# Patient Record
Sex: Male | Born: 1985 | Race: White | Hispanic: No | Marital: Single | Smoking: Current every day smoker
Health system: Southern US, Community
[De-identification: ages and names within clinical notes are randomized; demographics above are authoritative.]

## PROBLEM LIST (undated history)

## (undated) DIAGNOSIS — K219 Gastro-esophageal reflux disease without esophagitis: Secondary | ICD-10-CM

## (undated) DIAGNOSIS — E785 Hyperlipidemia, unspecified: Secondary | ICD-10-CM

## (undated) DIAGNOSIS — I82403 Acute embolism and thrombosis of unspecified deep veins of lower extremity, bilateral: Secondary | ICD-10-CM

## (undated) DIAGNOSIS — I10 Essential (primary) hypertension: Secondary | ICD-10-CM

## (undated) HISTORY — PX: WISDOM TOOTH EXTRACTION: SHX21

## (undated) HISTORY — PX: TONSILLECTOMY: SUR1361

## (undated) HISTORY — DX: Hyperlipidemia, unspecified: E78.5

## (undated) HISTORY — PX: APPENDECTOMY: SHX54

---
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
## 2007-05-23 ENCOUNTER — Emergency Department: Payer: Self-pay | Admitting: Emergency Medicine

## 2008-01-18 ENCOUNTER — Emergency Department: Payer: Self-pay | Admitting: Unknown Physician Specialty

## 2008-07-17 ENCOUNTER — Emergency Department: Payer: Self-pay

## 2009-08-28 ENCOUNTER — Emergency Department: Payer: Self-pay | Admitting: Emergency Medicine

## 2014-01-15 ENCOUNTER — Emergency Department: Payer: Self-pay | Admitting: Student

## 2014-10-13 ENCOUNTER — Encounter: Payer: Self-pay | Admitting: Emergency Medicine

## 2014-10-13 ENCOUNTER — Observation Stay
Admission: EM | Admit: 2014-10-13 | Discharge: 2014-10-15 | Disposition: A | Discharge status: Home / Self Care | Payer: Self-pay | Attending: Surgery | Admitting: Surgery

## 2014-10-13 ENCOUNTER — Emergency Department: Payer: Self-pay

## 2014-10-13 DIAGNOSIS — Z6838 Body mass index (BMI) 38.0-38.9, adult: Secondary | ICD-10-CM | POA: Insufficient documentation

## 2014-10-13 DIAGNOSIS — K3580 Unspecified acute appendicitis: Principal | ICD-10-CM | POA: Insufficient documentation

## 2014-10-13 DIAGNOSIS — Z9889 Other specified postprocedural states: Secondary | ICD-10-CM | POA: Insufficient documentation

## 2014-10-13 DIAGNOSIS — Z8249 Family history of ischemic heart disease and other diseases of the circulatory system: Secondary | ICD-10-CM | POA: Insufficient documentation

## 2014-10-13 DIAGNOSIS — R1031 Right lower quadrant pain: Secondary | ICD-10-CM | POA: Insufficient documentation

## 2014-10-13 DIAGNOSIS — E669 Obesity, unspecified: Secondary | ICD-10-CM | POA: Insufficient documentation

## 2014-10-13 DIAGNOSIS — F17200 Nicotine dependence, unspecified, uncomplicated: Secondary | ICD-10-CM | POA: Insufficient documentation

## 2014-10-13 LAB — URINALYSIS COMPLETE WITH MICROSCOPIC (ARMC ONLY)
Bacteria, UA: NONE SEEN
Bilirubin Urine: NEGATIVE
Glucose, UA: NEGATIVE mg/dL
HGB URINE DIPSTICK: NEGATIVE
KETONES UR: NEGATIVE mg/dL
LEUKOCYTES UA: NEGATIVE
NITRITE: NEGATIVE
PROTEIN: NEGATIVE mg/dL
RBC / HPF: NONE SEEN RBC/hpf (ref 0–5)
Specific Gravity, Urine: 1.034 — ABNORMAL HIGH (ref 1.005–1.030)
Squamous Epithelial / LPF: NONE SEEN
pH: 6 (ref 5.0–8.0)

## 2014-10-13 LAB — COMPREHENSIVE METABOLIC PANEL
ALK PHOS: 52 U/L (ref 38–126)
ALT: 45 U/L (ref 17–63)
AST: 24 U/L (ref 15–41)
Albumin: 4.3 g/dL (ref 3.5–5.0)
Anion gap: 7 (ref 5–15)
BUN: 7 mg/dL (ref 6–20)
CHLORIDE: 102 mmol/L (ref 101–111)
CO2: 27 mmol/L (ref 22–32)
CREATININE: 0.74 mg/dL (ref 0.61–1.24)
Calcium: 8.9 mg/dL (ref 8.9–10.3)
GFR calc non Af Amer: >60 mL/min (ref >60)
Glucose, Bld: 78 mg/dL (ref 65–99)
Potassium: 4.1 mmol/L (ref 3.5–5.1)
Sodium: 136 mmol/L (ref 135–145)
Total Bilirubin: 0.4 mg/dL (ref 0.3–1.2)
Total Protein: 7.4 g/dL (ref 6.5–8.1)

## 2014-10-13 LAB — CBC
HCT: 48.2 % (ref 40.0–52.0)
HEMOGLOBIN: 16.1 g/dL (ref 13.0–18.0)
MCH: 29.8 pg (ref 26.0–34.0)
MCHC: 33.5 g/dL (ref 32.0–36.0)
MCV: 89 fL (ref 80.0–100.0)
Platelets: 266 10*3/uL (ref 150–440)
RBC: 5.41 MIL/uL (ref 4.40–5.90)
RDW: 13.5 % (ref 11.5–14.5)
WBC: 13.3 10*3/uL — ABNORMAL HIGH (ref 3.8–10.6)

## 2014-10-13 MED ORDER — ONDANSETRON HCL 4 MG/2ML IJ SOLN
INTRAMUSCULAR | Status: AC
  Administered 2014-10-13: 4 mg via INTRAVENOUS
  Filled 2014-10-13: qty 2

## 2014-10-13 MED ORDER — KETOROLAC TROMETHAMINE 30 MG/ML IJ SOLN
30.0000 mg | Freq: Three times a day (TID) | INTRAMUSCULAR | Status: DC
  Administered 2014-10-13 – 2014-10-14 (×2): 30 mg via INTRAVENOUS
  Filled 2014-10-13 (×3): qty 1

## 2014-10-13 MED ORDER — MORPHINE SULFATE 2 MG/ML IJ SOLN
1.0000 mg | INTRAMUSCULAR | Status: DC | PRN
  Filled 2014-10-13: qty 1

## 2014-10-13 MED ORDER — ACETAMINOPHEN 500 MG PO TABS
1000.0000 mg | ORAL_TABLET | Freq: Once | ORAL | Status: AC
  Administered 2014-10-13: 1000 mg via ORAL

## 2014-10-13 MED ORDER — ACETAMINOPHEN 500 MG PO TABS
ORAL_TABLET | ORAL | Status: AC
  Administered 2014-10-13: 1000 mg via ORAL
  Filled 2014-10-13: qty 2

## 2014-10-13 MED ORDER — IOHEXOL 300 MG/ML  SOLN
125.0000 mL | Freq: Once | INTRAMUSCULAR | Status: AC | PRN
  Administered 2014-10-13: 125 mL via INTRAVENOUS
  Filled 2014-10-13: qty 125

## 2014-10-13 MED ORDER — ONDANSETRON HCL 4 MG/2ML IJ SOLN
4.0000 mg | Freq: Once | INTRAMUSCULAR | Status: AC
  Administered 2014-10-13: 4 mg via INTRAVENOUS

## 2014-10-13 MED ORDER — DEXTROSE 5 % IV SOLN
1.0000 g | Freq: Four times a day (QID) | INTRAVENOUS | Status: DC
  Administered 2014-10-13 – 2014-10-14 (×4): 1 g via INTRAVENOUS
  Filled 2014-10-13 (×7): qty 1

## 2014-10-13 MED ORDER — ONDANSETRON HCL 4 MG/2ML IJ SOLN
4.0000 mg | Freq: Four times a day (QID) | INTRAMUSCULAR | Status: DC | PRN
  Administered 2014-10-14: 4 mg via INTRAVENOUS

## 2014-10-13 MED ORDER — HYDROMORPHONE HCL 1 MG/ML IJ SOLN
1.0000 mg | Freq: Once | INTRAMUSCULAR | Status: AC
  Administered 2014-10-13: 1 mg via INTRAVENOUS
  Filled 2014-10-13: qty 1

## 2014-10-13 MED ORDER — SODIUM CHLORIDE 0.9 % IV SOLN
Freq: Once | INTRAVENOUS | Status: AC
  Administered 2014-10-13: 15:00:00 via INTRAVENOUS

## 2014-10-13 MED ORDER — ONDANSETRON HCL 4 MG/2ML IJ SOLN
4.0000 mg | Freq: Once | INTRAMUSCULAR | Status: AC
  Administered 2014-10-13: 4 mg via INTRAVENOUS
  Filled 2014-10-13: qty 2

## 2014-10-13 MED ORDER — KCL IN DEXTROSE-NACL 20-5-0.45 MEQ/L-%-% IV SOLN
INTRAVENOUS | Status: DC
  Administered 2014-10-13 – 2014-10-14 (×3): via INTRAVENOUS
  Filled 2014-10-13 (×6): qty 1000

## 2014-10-13 MED ORDER — IOHEXOL 240 MG/ML SOLN
25.0000 mL | Freq: Once | INTRAMUSCULAR | Status: AC | PRN
  Filled 2014-10-13: qty 25

## 2014-10-13 NOTE — ED Notes (Signed)
Pt presents with RUQ pain started last night. Denies any n/v/d. No acute distress noted.

## 2014-10-13 NOTE — ED Notes (Signed)
Pt to CT

## 2014-10-13 NOTE — ED Provider Notes (Signed)
[facility] Emergency Department Provider Note     Time seen: ----------------------------------------- 2:02 PM on 10/13/2014 -----------------------------------------    I have reviewed the triage vital signs and the nursing notes.   HISTORY  Chief Complaint Abdominal Pain    HPI Thomas Hopkins is a 29 y.o. male who presents ER with right lower quadrant pain that started last night. Patient denies any fevers chills, chest pain, shortness of breath, nausea vomiting or diarrhea. Pain is only in the right side. Movement sometimes helps. He describes pain as sharp and 8 out of 10 at this point.   History reviewed. No pertinent past medical history.  There are no active problems to display for this patient.   Past Surgical History  Procedure Laterality Date  . Tonsillectomy      Allergies Review of patient's allergies indicates no known allergies.  Social History History  Substance Use Topics  . Smoking status: Current Some Day Smoker  . Smokeless tobacco: Not on file  . Alcohol Use: No    Review of Systems Constitutional: Negative for fever. Eyes: Negative for visual changes. ENT: Negative for sore throat. Cardiovascular: Negative for chest pain. Respiratory: Negative for shortness of breath. Gastrointestinal: Positive for abdominal pain Genitourinary: Negative for dysuria. Musculoskeletal: Negative for back pain. Skin: Negative for rash. Neurological: Negative for headaches, focal weakness or numbness.  10-point ROS otherwise negative.  ____________________________________________   PHYSICAL EXAM:  VITAL SIGNS: ED Triage Vitals  Enc Vitals Group     BP 10/13/14 1322 116/68 mmHg     Pulse --      Resp 10/13/14 1322 18     Temp 10/13/14 1322 98.4 F (36.9 C)     Temp Source 10/13/14 1322 Oral     SpO2 10/13/14 1322 96 %     Weight 10/13/14 1322 250 lb (113.399 kg)     Height 10/13/14 1322 5\' 9"  (1.753 m)     Head Cir  --      Peak Flow --      Pain Score 10/13/14 1322 8     Pain Loc --      Pain Edu? --      Excl. in GC? --     Constitutional: Alert and oriented. Well appearing and in no distress. Eyes: Conjunctivae are normal. PERRL. Normal extraocular movements. ENT   Head: Normocephalic and atraumatic.   Nose: No congestion/rhinnorhea.   Mouth/Throat: Mucous membranes are moist.   Neck: No stridor. Cardiovascular: Normal rate, regular rhythm. Normal and symmetric distal pulses are present in all extremities. No murmurs, rubs, or gallops. Respiratory: Normal respiratory effort without tachypnea nor retractions. Breath sounds are clear and equal bilaterally. No wheezes/rales/rhonchi. Gastrointestinal: Pain in McBurney's point, no other specific focal tenderness. No rebound or guarding. Normal bowel sounds. Musculoskeletal: Nontender with normal range of motion in all extremities. No joint effusions.  No lower extremity tenderness nor edema. Neurologic:  Normal speech and language. No gross focal neurologic deficits are appreciated. Speech is normal. No gait instability. Skin:  Skin is warm, dry and intact. No rash noted. Psychiatric: Mood and affect are normal. Speech and behavior are normal. Patient exhibits appropriate insight and judgment. ____________________________________________  ED COURSE:  Pertinent labs & imaging results that were available during my care of the patient were reviewed by me and considered in my medical decision making (see chart for details). Clinically patient has appendicitis, will need CT imaging. IV Dilaudid and Zofran will be given for pain. ____________________________________________  LABS (pertinent positives/negatives)  Labs Reviewed  CBC - Abnormal; Notable for the following:    WBC 13.3 (*)    All other components within normal limits  URINALYSIS COMPLETEWITH MICROSCOPIC ([facility] ONLY) - Abnormal; Notable for the following:    Color, Urine  YELLOW (*)    APPearance CLEAR (*)    Specific Gravity, Urine 1.034 (*)    All other components within normal limits  COMPREHENSIVE METABOLIC PANEL    RADIOLOGY Images were viewed by me  CT abdomen and pelvis with contrast IMPRESSION: 1. No acute inflammatory process within abdomen. 2. Normal appendix. No pericecal inflammation. 3. Mild thickening of urinary bladder wall. Clinical correlation is necessary to exclude mild cystitis. 4. No small bowel obstruction. 5. No hydronephrosis or hydroureter.  ____________________________________________  FINAL ASSESSMENT AND PLAN  Acute right lower quadrant pain   Plan: Patient with labs and imaging as dictated above. Dr. Colette Ribas been contacted due to the persistent nature and specific nature of his right lower quadrant pain. Urine looks unremarkable. Disposition is pending surgery evaluation.   Emily Filbert, MD   Emily Filbert, MD 10/13/14 [phone]

## 2014-10-13 NOTE — H&P (Signed)
Thomas Hopkins is an 29 y.o. male.     Chief Complaint: RLQ abd pain  HPI: 29 year-old otherwise healthy white male presents to the emergency room with right lower quadrant abdominal pain which began around 1:00 in the morning. He got up use the bathroom did not have nausea vomiting or diarrhea. He denies any fever. There has been no sick contacts. He went back to sleep and woke up with continued right lower quadrant abdominal pain which has stayed in originating in the right lower quadrant came to the emergency room to seek medical attention. He denies any dysuria flank pain yellow jaundice history of diverticulitis or any other abdominal operations in the past. He denies any similar type abdominal pain like this in the past.  Evaluation in the emergency room with CT scan demonstrates a normal-appearing appendix which is not dilated.   History reviewed. No pertinent past medical history.  Past Surgical History  Procedure Laterality Date  . Tonsillectomy     Family history significant for hypertension.  Social History:  reports that he has been smoking.  He does not have any smokeless tobacco history on file. He reports that he does not drink alcohol. His drug history is not on file.  Allergies: No Known Allergies   Review of Systems  Constitutional: Negative for fever, chills, weight loss, malaise/fatigue and diaphoresis.  HENT: Negative.   Eyes: Negative.   Respiratory: Negative.   Cardiovascular: Negative.   Gastrointestinal: Positive for heartburn and nausea. Negative for vomiting, diarrhea, constipation, blood in stool and melena.  Genitourinary: Negative for dysuria, urgency, frequency, hematuria and flank pain.  Skin: Negative for itching and rash.  Neurological: Negative.  Negative for weakness.  Endo/Heme/Allergies: Negative.     Physical Exam:  Physical Exam  Constitutional: He is oriented to person, place, and time and well-developed, well-nourished, and in no  distress. No distress.  HENT:  Head: Atraumatic.  Eyes: Conjunctivae are normal. Pupils are equal, round, and reactive to light.  Neck: Neck supple.  Cardiovascular: Normal rate.   Pulmonary/Chest: Breath sounds normal. No respiratory distress.  Abdominal: Soft. Bowel sounds are normal. He exhibits no distension and no mass. There is no hepatosplenomegaly. There is tenderness in the right lower quadrant. There is rebound and tenderness at McBurney's point. There is no rigidity, no guarding and no CVA tenderness. No hernia.    Neurological: He is oriented to person, place, and time.  Skin: Skin is warm and dry. He is not diaphoretic.  Psychiatric: Mood, memory, affect and judgment normal.    Blood pressure 102/63, pulse 66, temperature 98.4 F (36.9 C), temperature source Oral, resp. rate 16, height 5' 9" (1.753 m), weight 250 lb (113.399 kg), SpO2 94 %.  Results for orders placed or performed during the hospital encounter of 10/13/14 (from the past 48 hour(s))  Comprehensive metabolic panel     Status: None   Collection Time: 10/13/14  1:44 PM  Result Value Ref Range   Sodium 136 135 - 145 mmol/L   Potassium 4.1 3.5 - 5.1 mmol/L   Chloride 102 101 - 111 mmol/L   CO2 27 22 - 32 mmol/L   Glucose, Bld 78 65 - 99 mg/dL   BUN 7 6 - 20 mg/dL   Creatinine, Ser 0.74 0.61 - 1.24 mg/dL   Calcium 8.9 8.9 - 10.3 mg/dL   Total Protein 7.4 6.5 - 8.1 g/dL   Albumin 4.3 3.5 - 5.0 g/dL   AST 24 15 - 41 U/L  ALT 45 17 - 63 U/L   Alkaline Phosphatase 52 38 - 126 U/L   Total Bilirubin 0.4 0.3 - 1.2 mg/dL   GFR calc non Af Amer >60 >60 mL/min   GFR calc Af Amer >60 >60 mL/min    Comment: (NOTE) The eGFR has been calculated using the CKD EPI equation. This calculation has not been validated in all clinical situations. eGFR's persistently <60 mL/min signify possible Chronic Kidney Disease.    Anion gap 7 5 - 15  CBC     Status: Abnormal   Collection Time: 10/13/14  1:44 PM  Result Value Ref  Range   WBC 13.3 (H) 3.8 - 10.6 K/uL   RBC 5.41 4.40 - 5.90 MIL/uL   Hemoglobin 16.1 13.0 - 18.0 g/dL   HCT 48.2 40.0 - 52.0 %   MCV 89.0 80.0 - 100.0 fL   MCH 29.8 26.0 - 34.0 pg   MCHC 33.5 32.0 - 36.0 g/dL   RDW 13.5 11.5 - 14.5 %   Platelets 266 150 - 440 K/uL  Urinalysis complete, with microscopic ([facility] only)     Status: Abnormal   Collection Time: 10/13/14  3:40 PM  Result Value Ref Range   Color, Urine YELLOW (A) YELLOW   APPearance CLEAR (A) CLEAR   Glucose, UA NEGATIVE NEGATIVE mg/dL   Bilirubin Urine NEGATIVE NEGATIVE   Ketones, ur NEGATIVE NEGATIVE mg/dL   Specific Gravity, Urine 1.034 (H) 1.005 - 1.030   Hgb urine dipstick NEGATIVE NEGATIVE   pH 6.0 5.0 - 8.0   Protein, ur NEGATIVE NEGATIVE mg/dL   Nitrite NEGATIVE NEGATIVE   Leukocytes, UA NEGATIVE NEGATIVE   RBC / HPF NONE SEEN 0 - 5 RBC/hpf   WBC, UA 0-5 0 - 5 WBC/hpf   Bacteria, UA NONE SEEN NONE SEEN   Squamous Epithelial / LPF NONE SEEN NONE SEEN   Mucous PRESENT    Ct Abdomen Pelvis W Contrast  10/13/2014   CLINICAL DATA:  Right lower quadrant pain starting yesterday evening  EXAM: CT ABDOMEN AND PELVIS WITH CONTRAST  TECHNIQUE: Multidetector CT imaging of the abdomen and pelvis was performed using the standard protocol following bolus administration of intravenous contrast.  CONTRAST:  125mL OMNIPAQUE IOHEXOL 300 MG/ML  SOLN  COMPARISON:  02/17/2011  FINDINGS: Lung bases are unremarkable. Sagittal images of the spine are unremarkable.  Liver, pancreas, spleen and adrenal glands are unremarkable. Abdominal aorta is unremarkable.  Kidneys are symmetrical in size and enhancement. No hydronephrosis or hydroureter.  No small bowel obstruction. No ascites or free air. No adenopathy. Normal appendix is noted in axial image 65. No pericecal inflammation. Terminal ileum is unremarkable. No small bowel or colonic obstruction. There is nonspecific mild thickening of urinary bladder wall. Mild cystitis cannot be excluded.  Prostate gland and seminal vesicles are unremarkable.  IMPRESSION: 1. No acute inflammatory process within abdomen. 2. Normal appendix.  No pericecal inflammation. 3. Mild thickening of urinary bladder wall. Clinical correlation is necessary to exclude mild cystitis. 4. No small bowel obstruction. 5. No hydronephrosis or hydroureter.   Electronically Signed   By: Liviu  Pop M.D.   On: 10/13/2014 15:25    I personally reviewed all the CT scan images on the PACS monitor.   Assessment/Plan    27-year-old otherwise healthy obese male with acute onset of right lower quadrant abdominal pain and some leukocytosis but normal-appearing CT scan obtained 14 hours after initial symptoms started. His exam is concerning for appendicitis. The patient will be admitted   to the hospital and hydrated and placed on intravenous antibiotics.  We will reassess later tonight. If he is improved we'll treat him for early appendicitis with nonoperative therapy and antibiotics. If he does not improve he will require diagnostic laparoscopy and appendectomy. I'll have Dr. Burt Knack my associate check on him later tonight. Patient is in agreement with this plan and wishes to be admitted to the hospital for such.  Hortencia Conradi, MD, FACS

## 2014-10-13 NOTE — Progress Notes (Signed)
Patient seen at the request of Dr. Egbert Garibaldi for follow-up. History is reviewed with Dr. Egbert Garibaldi and with the chart and with the patient. Patient states that his pain is better but it waxes and wanes. He confirms that it started yesterday approximately 24 hours ago. Denies nausea or vomiting at this time and points to the right side and periumbilical area.  Patient is obese abdomen is soft nondistended minimally tender in the right lower quadrant without guarding or rebound and a negative Rovsing sign. White blood cell count is elevated.  Probable early appendicitis CT scan is been reviewed. I agree with Dr. Egbert Garibaldi that this is likely early appendicitis and should declare itself in the next 24 hours he will likely require laparoscopic appendectomy this will be confirmed and discussed with Dr. Egbert Garibaldi in the morning.

## 2014-10-13 NOTE — ED Notes (Signed)
Pt throwing up at this time. MD notified.

## 2014-10-14 ENCOUNTER — Encounter: Payer: Self-pay | Admitting: Anesthesiology

## 2014-10-14 ENCOUNTER — Observation Stay: Payer: MEDICAID | Admitting: Anesthesiology

## 2014-10-14 ENCOUNTER — Observation Stay: Payer: Self-pay | Admitting: Anesthesiology

## 2014-10-14 ENCOUNTER — Encounter
Admission: EM | Disposition: A | Discharge status: Home / Self Care | Payer: Self-pay | Source: Home / Self Care | Attending: Emergency Medicine

## 2014-10-14 DIAGNOSIS — R1031 Right lower quadrant pain: Secondary | ICD-10-CM | POA: Insufficient documentation

## 2014-10-14 HISTORY — PX: LAPAROSCOPIC APPENDECTOMY: SHX408

## 2014-10-14 LAB — CBC WITH DIFFERENTIAL/PLATELET
BASOS ABS: 0.1 10*3/uL (ref 0–0.1)
Basophils Relative: 1 %
EOS ABS: 0.2 10*3/uL (ref 0–0.7)
Eosinophils Relative: 2 %
HCT: 45.1 % (ref 40.0–52.0)
HEMOGLOBIN: 15.2 g/dL (ref 13.0–18.0)
Lymphocytes Relative: 33 %
Lymphs Abs: 3.3 10*3/uL (ref 1.0–3.6)
MCH: 30 pg (ref 26.0–34.0)
MCHC: 33.6 g/dL (ref 32.0–36.0)
MCV: 89.1 fL (ref 80.0–100.0)
MONO ABS: 0.7 10*3/uL (ref 0.2–1.0)
Monocytes Relative: 7 %
NEUTROS ABS: 5.7 10*3/uL (ref 1.4–6.5)
Neutrophils Relative %: 57 %
PLATELETS: 244 10*3/uL (ref 150–440)
RBC: 5.06 MIL/uL (ref 4.40–5.90)
RDW: 13.8 % (ref 11.5–14.5)
WBC: 9.9 10*3/uL (ref 3.8–10.6)

## 2014-10-14 LAB — BASIC METABOLIC PANEL
Anion gap: 8 (ref 5–15)
BUN: 9 mg/dL (ref 6–20)
CO2: 28 mmol/L (ref 22–32)
CREATININE: 0.83 mg/dL (ref 0.61–1.24)
Calcium: 8.5 mg/dL — ABNORMAL LOW (ref 8.9–10.3)
Chloride: 103 mmol/L (ref 101–111)
GFR calc Af Amer: >60 mL/min (ref >60)
GFR calc non Af Amer: >60 mL/min (ref >60)
Glucose, Bld: 97 mg/dL (ref 65–99)
POTASSIUM: 4 mmol/L (ref 3.5–5.1)
Sodium: 139 mmol/L (ref 135–145)

## 2014-10-14 LAB — MRSA PCR SCREENING: MRSA BY PCR: NEGATIVE

## 2014-10-14 SURGERY — APPENDECTOMY, LAPAROSCOPIC
Anesthesia: General

## 2014-10-14 MED ORDER — NEOSTIGMINE METHYLSULFATE 10 MG/10ML IV SOLN
INTRAVENOUS | Status: DC | PRN
  Administered 2014-10-14: 3 mg via INTRAVENOUS

## 2014-10-14 MED ORDER — PROPOFOL 10 MG/ML IV BOLUS
INTRAVENOUS | Status: DC | PRN
  Administered 2014-10-14: 180 mg via INTRAVENOUS

## 2014-10-14 MED ORDER — SUCCINYLCHOLINE CHLORIDE 20 MG/ML IJ SOLN
INTRAMUSCULAR | Status: DC | PRN
  Administered 2014-10-14: 100 mg via INTRAVENOUS

## 2014-10-14 MED ORDER — MIDAZOLAM HCL 2 MG/2ML IJ SOLN
INTRAMUSCULAR | Status: DC | PRN
  Administered 2014-10-14: 1 mg via INTRAVENOUS

## 2014-10-14 MED ORDER — FENTANYL CITRATE (PF) 100 MCG/2ML IJ SOLN
25.0000 ug | INTRAMUSCULAR | Status: DC | PRN
  Administered 2014-10-14 (×3): 50 ug via INTRAVENOUS

## 2014-10-14 MED ORDER — HYDROMORPHONE HCL 1 MG/ML IJ SOLN
INTRAMUSCULAR | Status: AC
  Administered 2014-10-14: 0.5 mg via INTRAVENOUS
  Filled 2014-10-14: qty 1

## 2014-10-14 MED ORDER — BUPIVACAINE-EPINEPHRINE (PF) 0.25% -1:200000 IJ SOLN
INTRAMUSCULAR | Status: AC
  Filled 2014-10-14: qty 30

## 2014-10-14 MED ORDER — LIDOCAINE HCL (CARDIAC) 20 MG/ML IV SOLN
INTRAVENOUS | Status: DC | PRN
  Administered 2014-10-14: 30 mg via INTRAVENOUS

## 2014-10-14 MED ORDER — ONDANSETRON HCL 4 MG/2ML IJ SOLN: 4.0000 mg | Freq: Once | INTRAMUSCULAR | Status: DC | PRN

## 2014-10-14 MED ORDER — HYDROMORPHONE HCL 1 MG/ML IJ SOLN
0.5000 mg | INTRAMUSCULAR | Status: DC | PRN
  Administered 2014-10-14 (×3): 0.5 mg via INTRAVENOUS

## 2014-10-14 MED ORDER — BUPIVACAINE HCL 0.25 % IJ SOLN
INTRAMUSCULAR | Status: DC | PRN
  Administered 2014-10-14: 30 mL

## 2014-10-14 MED ORDER — LACTATED RINGERS IV SOLN
INTRAVENOUS | Status: DC | PRN
  Administered 2014-10-14: 20:00:00 via INTRAVENOUS

## 2014-10-14 MED ORDER — HYDROMORPHONE HCL 1 MG/ML IJ SOLN
INTRAMUSCULAR | Status: AC
  Filled 2014-10-14: qty 1

## 2014-10-14 MED ORDER — SODIUM CHLORIDE 0.9 % IR SOLN
Status: DC | PRN
  Administered 2014-10-14: 300 mL

## 2014-10-14 MED ORDER — HYDROCODONE-ACETAMINOPHEN 5-300 MG PO TABS: 1.0000 | ORAL_TABLET | ORAL | Status: DC | PRN

## 2014-10-14 MED ORDER — ROCURONIUM BROMIDE 100 MG/10ML IV SOLN
INTRAVENOUS | Status: DC | PRN
  Administered 2014-10-14: 30 mg via INTRAVENOUS

## 2014-10-14 MED ORDER — DEXAMETHASONE SODIUM PHOSPHATE 4 MG/ML IJ SOLN
INTRAMUSCULAR | Status: DC | PRN
  Administered 2014-10-14: 4 mg via INTRAVENOUS

## 2014-10-14 MED ORDER — GLYCOPYRROLATE 0.2 MG/ML IJ SOLN
INTRAMUSCULAR | Status: DC | PRN
  Administered 2014-10-14: 0.2 mg via INTRAVENOUS
  Administered 2014-10-14: 0.6 mg via INTRAVENOUS

## 2014-10-14 MED ORDER — HYDROCODONE-ACETAMINOPHEN 5-325 MG PO TABS
1.0000 | ORAL_TABLET | ORAL | Status: DC | PRN
  Administered 2014-10-15 (×3): 1 via ORAL
  Filled 2014-10-14 (×3): qty 1

## 2014-10-14 MED ORDER — LACTATED RINGERS IV SOLN
INTRAVENOUS | Status: DC
  Administered 2014-10-14 – 2014-10-15 (×2): via INTRAVENOUS

## 2014-10-14 MED ORDER — ACETAMINOPHEN 325 MG PO TABS
650.0000 mg | ORAL_TABLET | Freq: Four times a day (QID) | ORAL | Status: DC | PRN
  Administered 2014-10-14 – 2014-10-15 (×2): 650 mg via ORAL
  Filled 2014-10-14 (×2): qty 2

## 2014-10-14 SURGICAL SUPPLY — 42 items
CANISTER SUCT 1200ML W/VALVE (MISCELLANEOUS) IMPLANT
CANISTER SUCT 3000ML (MISCELLANEOUS) ×3 IMPLANT
CHLORAPREP W/TINT 26ML (MISCELLANEOUS) ×3 IMPLANT
CLOSURE WOUND 1/2 X4 (GAUZE/BANDAGES/DRESSINGS) ×1
CUTTER LINEAR ENDO 35 ART FLEX (STAPLE) ×3 IMPLANT
CUTTER LINEAR ENDO 35 ETS (STAPLE) IMPLANT
DEFOGGER SCOPE WARMER CLEARIFY (MISCELLANEOUS) IMPLANT
DRAPE SHEET LG 3/4 BI-LAMINATE (DRAPES) ×3 IMPLANT
DRAPE UTILITY 15X26 TOWEL STRL (DRAPES) IMPLANT
DRESSING TELFA 4X3 1S ST N-ADH (GAUZE/BANDAGES/DRESSINGS) IMPLANT
DRSG TEGADERM 2-3/8X2-3/4 SM (GAUZE/BANDAGES/DRESSINGS) IMPLANT
ENDOPOUCH RETRIEVER 10 (MISCELLANEOUS) ×3 IMPLANT
GAUZE SPONGE 4X4 12PLY STRL (GAUZE/BANDAGES/DRESSINGS) IMPLANT
GLOVE BIO SURGEON STRL SZ7.5 (GLOVE) ×9 IMPLANT
GOWN STRL REUS W/ TWL LRG LVL3 (GOWN DISPOSABLE) ×1 IMPLANT
GOWN STRL REUS W/ TWL XL LVL3 (GOWN DISPOSABLE) ×1 IMPLANT
GOWN STRL REUS W/TWL LRG LVL3 (GOWN DISPOSABLE) ×2
GOWN STRL REUS W/TWL XL LVL3 (GOWN DISPOSABLE) ×2
IRRIGATION STRYKERFLOW (MISCELLANEOUS) ×1 IMPLANT
IRRIGATOR STRYKERFLOW (MISCELLANEOUS) ×3
IV NS 1000ML (IV SOLUTION)
IV NS 1000ML BAXH (IV SOLUTION) IMPLANT
LABEL OR SOLS (LABEL) IMPLANT
NDL SAFETY 25GX1.5 (NEEDLE) ×3 IMPLANT
NS IRRIG 500ML POUR BTL (IV SOLUTION) ×3 IMPLANT
PACK LAP CHOLECYSTECTOMY (MISCELLANEOUS) ×3 IMPLANT
PAD GROUND ADULT SPLIT (MISCELLANEOUS) IMPLANT
RELOAD CUTTER ETS 35MM STAND (ENDOMECHANICALS) ×9 IMPLANT
SCISSORS METZENBAUM CVD 33 (INSTRUMENTS) ×3 IMPLANT
SHEARS HARMONIC ACE PLUS 36CM (ENDOMECHANICALS) IMPLANT
SLEEVE ENDOPATH XCEL 5M (ENDOMECHANICALS) ×3 IMPLANT
STRAP SAFETY BODY (MISCELLANEOUS) ×3 IMPLANT
STRIP CLOSURE SKIN 1/2X4 (GAUZE/BANDAGES/DRESSINGS) ×2 IMPLANT
SUT VIC AB 0 UR5 27 (SUTURE) ×6 IMPLANT
SUT VIC AB 4-0 RB1 27 (SUTURE) ×2
SUT VIC AB 4-0 RB1 27X BRD (SUTURE) ×1 IMPLANT
SWABSTK COMLB BENZOIN TINCTURE (MISCELLANEOUS) IMPLANT
TAPE TRANSPORE STRL 2 31045 (GAUZE/BANDAGES/DRESSINGS) ×3 IMPLANT
TROCAR XCEL 12X100 BLDLESS (ENDOMECHANICALS) ×3 IMPLANT
TROCAR XCEL BLUNT TIP 100MML (ENDOMECHANICALS) ×3 IMPLANT
TROCAR XCEL NON-BLD 5MMX100MML (ENDOMECHANICALS) ×3 IMPLANT
TUBING INSUFFLATOR HI FLOW (MISCELLANEOUS) ×3 IMPLANT

## 2014-10-14 NOTE — Progress Notes (Signed)
Patient ID: ONEY FOLZ, male   DOB: 15-May-1985, 29 y.o.   MRN: [id number]  [facility]   PATIENT NAME: Kaileb Monsanto    MR#:  [id number]  DATE OF BIRTH:  [birth year]  SUBJECTIVE:  He is feeling better. He does not want morphine. He denies any nausea and vomiting. Still having some right lower quadrant abdominal pain.  REVIEW OF SYSTEMS:   Review of Systems  Constitutional: Negative for fever and chills.  Gastrointestinal: Positive for abdominal pain. Negative for nausea and vomiting.  Genitourinary: Negative for dysuria, urgency and hematuria.  All other systems reviewed and are negative.   DRUG ALLERGIES:  No Known Allergies  VITALS:  Blood pressure 100/53, pulse 45, temperature 98.1 F (36.7 C), temperature source Oral, resp. rate 16, height  (1.753 m), weight 264 lb 1.6 oz (119.795 kg), SpO2 97 %.  PHYSICAL EXAMINATION:  GENERAL:  29 y.o.-year-old patient lying in the bed with no acute distress.  EYES: Pupils equal, round, reactive to light and accommodation. No scleral icterus. Extraocular muscles intact.  HEENT: Head atraumatic, normocephalic. Oropharynx and nasopharynx clear.  NECK:  Supple, no jugular venous distention. No thyroid enlargement, no tenderness.  LUNGS: Normal breath sounds bilaterally, no wheezing, rales,rhonchi or crepitation. No use of accessory muscles of respiration.  CARDIOVASCULAR: S1, S2 normal. No murmurs, rubs, or gallops.  ABDOMEN: Soft, nontender, nondistended. Bowel sounds present. No organomegaly or mass.  EXTREMITIES: No pedal edema, cyanosis, or clubbing.  NEUROLOGIC: Cranial nerves II through XII are intact. Muscle strength 5/5 in all extremities. Sensation intact. Gait not checked.  PSYCHIATRIC: The patient is alert and oriented x 3.  SKIN: No obvious rash, lesion, or ulcer.    CBC Latest Ref Rng 10/14/2014 10/13/2014  WBC 3.8 - 10.6 K/uL 9.9 13.3(H)  Hemoglobin 13.0 - 18.0 g/dL 78.2 95.6  Hematocrit 21.3 - 52.0 %  45.1 48.2  Platelets 150 - 440 K/uL 244 266     ASSESSMENT AND PLAN:   29 year old male with likely atypical appendicitis. He is somewhat improved. His white count is now normalized. I will reexamine him later today. If he is continuing to have pain on afternoon rounds he will require laparoscopic appendectomy and diagnostic laparoscopy he is in agreement.

## 2014-10-14 NOTE — Discharge Summary (Signed)
Physician Discharge Summary  Patient ID: Thomas Hopkins MRN: [id number] DOB/AGE: March 10, 1986 28 y.o.  Admit date: 10/13/2014 Discharge date: 10/14/2014   Discharge Diagnoses:  Active Problems:   Abdominal pain, acute, right lower quadrant   Procedures: Laparoscopic appendectomy  Hospital Course: She was admitted the hospital and observed with a CT-negative abdomen but with signs of acute appendicitis his pain progressed as did his exam and it was decided to perform a diagnostic laparoscopy which confirmed the presence of early appendicitis upper scopic appendectomy was performed. He made a non-, located postoperative recovery and is tolerating regular diet discharge in stable condition with instructions to shower and follow up in 10 days  Consults: None  Disposition: Final discharge disposition not confirmed     Medication List    TAKE these medications        Hydrocodone-Acetaminophen 5-300 MG Tabs  Commonly known as:  VICODIN  Take 1 tablet by mouth every 4 (four) hours as needed.           Follow-up Information    Follow up with Dionne Milo, MD In 10 days.   Specialty:  Surgery   Why:  For wound re-check   Contact information:   [address] [place] [address] [phone]       Lattie Haw, MD, FACS

## 2014-10-14 NOTE — Anesthesia Preprocedure Evaluation (Signed)
Anesthesia Evaluation  Patient identified by MRN, date of birth, ID band Patient awake    Reviewed: Allergy & Precautions, NPO status , Patient's Chart, lab work & pertinent test results, reviewed documented beta blocker date and time   Airway Mallampati: III  TM Distance: >3 FB     Dental  (+) Chipped   Pulmonary Current Smoker,          Cardiovascular     Neuro/Psych    GI/Hepatic   Endo/Other    Renal/GU      Musculoskeletal   Abdominal   Peds  Hematology   Anesthesia Other Findings Obesity   Reproductive/Obstetrics                             Anesthesia Physical Anesthesia Plan  ASA: III  Anesthesia Plan: General   Post-op Pain Management:    Induction: Intravenous  Airway Management Planned: Oral ETT  Additional Equipment:   Intra-op Plan:   Post-operative Plan:   Informed Consent: I have reviewed the patients History and Physical, chart, labs and discussed the procedure including the risks, benefits and alternatives for the proposed anesthesia with the patient or authorized representative who has indicated his/her understanding and acceptance.     Plan Discussed with: CRNA  Anesthesia Plan Comments:         Anesthesia Quick Evaluation

## 2014-10-14 NOTE — Op Note (Signed)
laparascopic appendectomy   Thomas Hopkins Date of operation:  10/14/2014  Indications: The patient presented with a history of  abdominal pain. Workup has revealed findings consistent with acute appendicitis.  Pre-operative Diagnosis: Acute appendicitis  Post-operative Diagnosis acute appendicitis nonruptured  Surgeon: Adah Salvage. Excell Seltzer, MD, FACS  Anesthesia: General with endotracheal tube  Procedure Details  The patient was seen again in the preop area. The options of surgery versus observation were reviewed with the patient and/or family. The risks of bleeding, infection, recurrence of symptoms, negative laparoscopy, potential for an open procedure, bowel injury, abscess or infection, were all reviewed as well. The patient was taken to Operating Room, identified as Thomas Hopkins and the procedure verified as laparoscopic appendectomy. A Time Out was held and the above information confirmed.  The patient was placed in the supine position and general anesthesia was induced.  Antibiotic prophylaxis was administered and VT E prophylaxis was in place. A Foley catheter was placed by the nursing staff.   The abdomen was prepped and draped in a sterile fashion. An infraumbilical incision was made. A Veress needle was placed and pneumoperitoneum was obtained. A 5 mm trocar port was placed without difficulty and the abdominal cavity was explored.  Under direct vision a 5 mm suprapubic port was placed and a 13 mm left lateral port was placed all under direct vision.  The appendix was identified and found to be acutely inflamed but not ruptured.  The appendix was carefully dissected. The base of the appendix was dissected out and divided with a standard load Endo GIA. The mesoappendix was divided with a vascular load Endo GIA. 3 firings were required. Inspection of the staple line demonstrated arterial bleeding therefore the staple line was reinforced with clips. The appendix was passed out  through the left lateral port site with the aid of an Endo Catch bag. The right lower quadrant and pelvis was then irrigated with copious amounts of normal saline which was aspirated. Inspection  failed to identify any additional bleeding and there were no signs of bowel injury. Therefore the left lateral port site was closed under direct vision utilizing an Endo Close technique with 0 Vicryl interrupted sutures, all under direct vision.   Again the right lower quadrant was inspected there was no sign of bleeding or bowel injury therefore pneumoperitoneum was released, all ports were removed and the skin incisions were approximated with subcuticular 4-0 Monocryl. Steri-Strips and Mastisol and sterile dressings were placed.  The patient tolerated the procedure well, there were no complications. The sponge lap and needle count were correct at the end of the procedure.  The patient was taken to the recovery room in stable condition to be admitted for continued care.  Findings: Acute appendicitis nonruptured  Estimated Blood Loss: 25 cc                  Specimens: appendix         Complications: None                  Rowena Moilanen E. Excell Seltzer MD, FACS

## 2014-10-14 NOTE — OR Nursing (Signed)
Patient's OR Fentanyl was documented under pacu phase of care

## 2014-10-14 NOTE — Progress Notes (Signed)
Patient ID: Thomas Hopkins, male   DOB: 05-06-1985, 29 y.o.   MRN: [id number]   He is unchanged from this a.m.'s examination. He is hungry. No nausea and vomiting. He has not required any intravenous narcotics for oral narcotics.  Exam is unchanged.  Plan for diagnostic laparoscopy later today.

## 2014-10-14 NOTE — Progress Notes (Signed)
Patient met last night and history reviewed again. Patient continues to have right lower quadrant pain which waxes and wanes but never goes away. His white blood cell count is back towards normal.  His exam today demonstrates continued right lower quadrant tenderness with some mild peritoneal irritation.  Viewing this patient's history and discussion with Dr. Marina Gravel I am in full agreement the laparoscopy is indicated and likely appendectomy will be performed. I discussed with the patient the rationale for this the options of observation the risks of bleeding infection recurrence of symptoms negative laparoscopy conversion to an open procedure. This is all reviewed for him in the preop area he understood and agreed to proceed

## 2014-10-14 NOTE — Anesthesia Procedure Notes (Signed)
Procedure Name: Intubation Date/Time: 10/14/2014 8:05 PM Performed by: Waldo Laine Pre-anesthesia Checklist: Patient identified, Emergency Drugs available, Suction available, Patient being monitored and Timeout performed Patient Re-evaluated:Patient Re-evaluated prior to inductionOxygen Delivery Method: Circle system utilized Preoxygenation: Pre-oxygenation with 100% oxygen Intubation Type: IV induction and Cricoid Pressure applied Ventilation: Mask ventilation without difficulty Tube type: Oral Laser Tube: Cuffed inflated with minimal occlusive pressure - saline Tube size: 7.5 mm Number of attempts: 1 Airway Equipment and Method: Stylet and Video-laryngoscopy Placement Confirmation: positive ETCO2 and breath sounds checked- equal and bilateral Secured at: 21 cm Tube secured with: Tape Difficulty Due To: Difficulty was anticipated Future Recommendations: Recommend- induction with short-acting agent, and alternative techniques readily available

## 2014-10-14 NOTE — Progress Notes (Signed)
Dr. Excell Seltzer notified of hypotension and bradycardia; no new orders written; Windy Carina, RN; 10/14/2014; 12:30 AM

## 2014-10-14 NOTE — Transfer of Care (Signed)
Immediate Anesthesia Transfer of Care Note  Patient: Thomas Hopkins  Procedure(s) Performed: Procedure(s): APPENDECTOMY LAPAROSCOPIC (N/A)  Patient Location: PACU  Anesthesia Type:General  Level of Consciousness: sedated, pateint uncooperative and confused  Airway & Oxygen Therapy: Patient Spontanous Breathing and Patient connected to face mask oxygen  Post-op Assessment: Report given to RN and Post -op Vital signs reviewed and stable  Post vital signs: Reviewed and stable  Last Vitals:  Filed Vitals:   10/14/14 1554  BP: 116/62  Pulse: 52  Temp: 36.9 C  Resp: 16    Complications: No apparent anesthesia complications

## 2014-10-14 NOTE — Progress Notes (Signed)
Patient ID: DUVAN MOUSEL, male   DOB: 1985-09-29, 29 y.o.   MRN: [id number]   Exam and pain unchanged Plan laparoscopic appendectomy today.

## 2014-10-15 ENCOUNTER — Encounter: Payer: Self-pay | Admitting: Surgery

## 2014-10-15 MED ORDER — KETOROLAC TROMETHAMINE 30 MG/ML IJ SOLN
30.0000 mg | Freq: Three times a day (TID) | INTRAMUSCULAR | Status: DC
  Administered 2014-10-15 (×2): 30 mg via INTRAVENOUS
  Filled 2014-10-15 (×2): qty 1

## 2014-10-15 MED ORDER — HYDROCODONE-ACETAMINOPHEN 5-325 MG PO TABS: 1.0000 | ORAL_TABLET | ORAL | Status: DC | PRN

## 2014-10-15 NOTE — Anesthesia Postprocedure Evaluation (Signed)
  Anesthesia Post-op Note  Patient: Thomas Hopkins  Procedure(s) Performed: Procedure(s): APPENDECTOMY LAPAROSCOPIC (N/A)  Anesthesia type:General  Patient location: PACU  Post pain: Pain level controlled  Post assessment: Post-op Vital signs reviewed, Patient's Cardiovascular Status Stable, Respiratory Function Stable, Patent Airway and No signs of Nausea or vomiting  Post vital signs: Reviewed and stable  Last Vitals:  Filed Vitals:   10/15/14 1227  BP: 106/50  Pulse: 50  Temp: 37 C  Resp: 16    Level of consciousness: awake, alert  and patient cooperative  Complications: No apparent anesthesia complications

## 2014-10-15 NOTE — Progress Notes (Signed)
Pt d/c home; d/c instructions reviewed w/ pt; pt understanding was verbalized; IV removed catheter in tact, gauze dressing applied; all pt questions answered; dressings CDI at time of d/c; pt left unit via wheelchair accompanied by staff

## 2014-10-15 NOTE — Progress Notes (Signed)
Patient ID: Thomas Hopkins, male   DOB: 08-13-1985, 29 y.o.   MRN: [id number]   Postoperative day #1 status post laparoscopic appendectomy.  He has Complaints of left lower quadrant abdominal pain as well as right shoulder plain and diffuse abdominal distention. He is only tolerated a liquid diet today. He has required some oral narcotics.  Filed Vitals:   10/14/14 2209 10/15/14 0003 10/15/14 0436 10/15/14 0755  BP: 107/51 99/52 96/58  94/48  Pulse: 50 97 92 54  Temp:  98.2 F (36.8 C) 97.6 F (36.4 C) 98.1 F (36.7 C)  TempSrc:  Oral Oral Oral  Resp:  16  17  Height:      Weight:      SpO2: 97% 94% 96% 97%   I/O last 3 completed shifts: In: 3296.5 [P.O.:360; I.V.:2836.5; IV Piggyback:100] Out: 2250 [Urine:2250]    Abdomen is soft. Dressings are dry.  Impression postoperative pain likely secondary to pneumoperitoneum.  Plan toward all. Mobilize. Possible discharge later this afternoon.

## 2014-10-15 NOTE — Discharge Instructions (Signed)
Remove dressing in 24 hours. °May shower in 24 hours. °Leave paper strips in place. °Resume all home medications. °Follow-up with Dr. Cooper in 10 days. °

## 2014-10-16 LAB — SURGICAL PATHOLOGY

## 2014-10-20 ENCOUNTER — Telehealth: Payer: Self-pay

## 2014-10-20 NOTE — Telephone Encounter (Signed)
Patient called back at this time.   Appointment changed to Dr. Excell Seltzer (10/22/14) at 2:30pm.  Readback of appointment information completed over the phone. Pt given directions to [facility] office.

## 2014-10-20 NOTE — Telephone Encounter (Signed)
Called patient at this time to place on Dr. Earmon Phoenix schedule as he is the one that did surgery.  No answer. Unable to leave message as voicemail was not available.  Will try once again later.

## 2014-10-22 ENCOUNTER — Other Ambulatory Visit: Payer: Self-pay

## 2014-10-22 ENCOUNTER — Observation Stay
Admission: AD | Admit: 2014-10-22 | Discharge: 2014-10-23 | Disposition: A | Discharge status: Home / Self Care | Payer: MEDICAID | Source: Ambulatory Visit | Attending: Surgery | Admitting: Surgery

## 2014-10-22 ENCOUNTER — Other Ambulatory Visit: Payer: Self-pay | Admitting: Internal Medicine

## 2014-10-22 ENCOUNTER — Ambulatory Visit (INDEPENDENT_AMBULATORY_CARE_PROVIDER_SITE_OTHER): Payer: Self-pay | Admitting: Surgery

## 2014-10-22 ENCOUNTER — Encounter: Payer: Self-pay | Admitting: Surgery

## 2014-10-22 VITALS — BP 123/76 | HR 109 | Temp 98.9°F | Ht 69.0 in | Wt 256.0 lb

## 2014-10-22 DIAGNOSIS — F17200 Nicotine dependence, unspecified, uncomplicated: Secondary | ICD-10-CM | POA: Insufficient documentation

## 2014-10-22 DIAGNOSIS — M79661 Pain in right lower leg: Secondary | ICD-10-CM

## 2014-10-22 DIAGNOSIS — M79669 Pain in unspecified lower leg: Secondary | ICD-10-CM

## 2014-10-22 DIAGNOSIS — M79662 Pain in left lower leg: Secondary | ICD-10-CM

## 2014-10-22 DIAGNOSIS — Z86718 Personal history of other venous thrombosis and embolism: Secondary | ICD-10-CM | POA: Diagnosis present

## 2014-10-22 DIAGNOSIS — I824Z9 Acute embolism and thrombosis of unspecified deep veins of unspecified distal lower extremity: Secondary | ICD-10-CM

## 2014-10-22 DIAGNOSIS — I82432 Acute embolism and thrombosis of left popliteal vein: Principal | ICD-10-CM | POA: Insufficient documentation

## 2014-10-22 DIAGNOSIS — I82443 Acute embolism and thrombosis of tibial vein, bilateral: Secondary | ICD-10-CM | POA: Insufficient documentation

## 2014-10-22 DIAGNOSIS — I82409 Acute embolism and thrombosis of unspecified deep veins of unspecified lower extremity: Secondary | ICD-10-CM | POA: Diagnosis present

## 2014-10-22 DIAGNOSIS — Z9049 Acquired absence of other specified parts of digestive tract: Secondary | ICD-10-CM | POA: Insufficient documentation

## 2014-10-22 MED ORDER — HYDROCODONE-ACETAMINOPHEN 5-325 MG PO TABS: 1.0000 | ORAL_TABLET | ORAL | Status: DC | PRN

## 2014-10-22 MED ORDER — APIXABAN 5 MG PO TABS
10.0000 mg | ORAL_TABLET | Freq: Two times a day (BID) | ORAL | Status: DC
  Administered 2014-10-22 – 2014-10-23 (×2): 10 mg via ORAL
  Filled 2014-10-22 (×2): qty 2

## 2014-10-22 MED ORDER — IBUPROFEN 600 MG PO TABS
600.0000 mg | ORAL_TABLET | Freq: Three times a day (TID) | ORAL | Status: DC
  Administered 2014-10-22 – 2014-10-23 (×2): 600 mg via ORAL
  Filled 2014-10-22 (×2): qty 1

## 2014-10-22 MED ORDER — ALPRAZOLAM 1 MG PO TABS: 1.0000 mg | ORAL_TABLET | Freq: Every evening | ORAL | Status: DC | PRN

## 2014-10-22 MED ORDER — ACETAMINOPHEN 325 MG PO TABS: 650.0000 mg | ORAL_TABLET | Freq: Four times a day (QID) | ORAL | Status: DC | PRN

## 2014-10-22 MED ORDER — DIPHENHYDRAMINE HCL 25 MG PO CAPS
50.0000 mg | ORAL_CAPSULE | Freq: Three times a day (TID) | ORAL | Status: DC | PRN
  Administered 2014-10-22: 50 mg via ORAL
  Filled 2014-10-22: qty 2

## 2014-10-22 MED ORDER — KCL IN DEXTROSE-NACL 20-5-0.45 MEQ/L-%-% IV SOLN
INTRAVENOUS | Status: DC
  Administered 2014-10-22: 23:00:00 via INTRAVENOUS
  Filled 2014-10-22 (×4): qty 1000

## 2014-10-22 MED ORDER — APIXABAN 5 MG PO TABS
5.0000 mg | ORAL_TABLET | Freq: Two times a day (BID) | ORAL | Status: DC
Start: 2014-10-29 — End: 2014-10-23

## 2014-10-22 MED ORDER — APIXABAN 5 MG PO TABS: 5.0000 mg | ORAL_TABLET | Freq: Two times a day (BID) | ORAL | Status: DC

## 2014-10-22 MED ORDER — FAMOTIDINE 20 MG PO TABS
20.0000 mg | ORAL_TABLET | Freq: Every day | ORAL | Status: DC
  Administered 2014-10-22 – 2014-10-23 (×2): 20 mg via ORAL
  Filled 2014-10-22 (×2): qty 1

## 2014-10-22 NOTE — Patient Instructions (Signed)
Radiology is scheduled for this afternoon for a venous ultrasound to assess your leg for blood clots. The results of this will dictate your disposition either home or to the emergency room for anticoagulation.  Concerning her abdomen and appendicitis he may return to your normal activities with the exception of lifting with a 25 pound weight limit restriction for another 2 weeks

## 2014-10-22 NOTE — H&P (Signed)
Thomas Hopkins is a 29 y.o. male who is one-week status post laparoscopic appendectomy.  HPI: He presented emergency room today with evidence of significant calf tenderness. His left leg hurts significantly and the cavity had some moderate pain on the right leg. Because of his symptoms he was referred to the hospital for urgent duplex ultrasound lower extremities which demonstrated popliteal and infrapopliteal thrombosis. He is medical hospital for anticoagulation and assisted with his medications.  His main symptoms are pain and discomfort when he is ambulating. He has had some mild swelling.  No past medical history on file. Past Surgical History  Procedure Laterality Date  . Tonsillectomy    . Laparoscopic appendectomy N/A 10/14/2014    Procedure: APPENDECTOMY LAPAROSCOPIC;  Surgeon: Lattie Haw, MD;  Location: [facility] ORS;  Service: General;  Laterality: N/A;   Social History   Social History  . Marital Status: Single    Spouse Name: N/A  . Number of Children: N/A  . Years of Education: N/A   Social History Main Topics  . Smoking status: Current Some Day Smoker -- 1.00 packs/day for 15 years  . Smokeless tobacco: Never Used  . Alcohol Use: Yes     Comment: rare consumption  . Drug Use: No  . Sexual Activity: Not on file   Other Topics Concern  . Not on file   Social History Narrative     ROS 10 point review of systems carried out and otherwise unremarkable.   PHYSICAL EXAM: BP 115/65 mmHg  Pulse 81  Temp(Src) 98.5 F (36.9 C) (Oral)  Resp 17  SpO2 97%  Physical Exam  Constitutional: He is oriented to person, place, and time. He appears well-developed and well-nourished.  HENT:  Head: Normocephalic and atraumatic.  Eyes: EOM are normal. Pupils are equal, round, and reactive to light.  Neck: Normal range of motion. Neck supple.  Cardiovascular: Regular rhythm and normal heart sounds.   Pulmonary/Chest: Effort normal and breath sounds normal.  Abdominal:  Soft.  Musculoskeletal: Normal range of motion. He exhibits edema and tenderness.  Neurological: He is alert and oriented to person, place, and time.  Skin: Skin is warm and dry.  Psychiatric: He has a normal mood and affect. His behavior is normal.   His legs demonstrate some mild edema and some tenderness on deep palpation. He does have a positive Homans sign left leg.  Impression/Plan: I have discussed situation with the medical doctors. We will start him on anticoagulation this evening. Because of his limited resources will have case management see him in the morning to assist with that indication as an outpatient. His family is in agreement with this plan.   Tiney Rouge III, MD  10/22/2014, 6:43 PM

## 2014-10-22 NOTE — Progress Notes (Signed)
Outpatient postop visit  10/22/2014  Thomas Hopkins is an 29 y.o. male.    Procedure: Lap or scopic appendectomy  CC: Left lower quadrant pain and left leg pain  HPI: Patient status post laparoscopic appendectomy and has been feeling well but approximately 8 days ago he fell asleep in his chair with his leg bent and since then has had pain in his left calf minimal pain in his right calf it hurts more when he stands. Denies fevers or chills no nausea or vomiting is tolerating a regular diet. It hurts to walk.  Of note he has no family history of blood clots.  Medications reviewed.    Physical Exam:  BP 123/76 mmHg  Pulse 109  Temp(Src) 98.9 F (37.2 C)  Ht  (1.753 m)  Wt 256 lb (116.121 kg)  BMI 37.79 kg/m2    PE: Abdomen is soft and nontender wounds are healing without erythema moderate ecchymosis in the left lateral port site.  Tenderness right calf with a negative Homans sign  Considerable tenderness of left calf with a questionably positive Homans sign. There is edema as well of the foot      Assessment/Plan:  Patient doing well postoperatively from laparoscopic appendectomy however there is concern for a left sided DVT. he is not in a high risk group but because of his symptoms and physical findings I am recommending an urgent venous Doppler scan. I discussed with he and his family the rationale for this and if it is positive he will be sent to the emergency room today to consider anticoagulation. A Wise he'll follow-up with [place] on an as-needed basis.  Lattie Haw, MD, FACS

## 2014-10-23 ENCOUNTER — Ambulatory Visit: Payer: Self-pay | Admitting: Surgery

## 2014-10-23 LAB — BASIC METABOLIC PANEL
ANION GAP: 10 (ref 5–15)
BUN: 12 mg/dL (ref 6–20)
CALCIUM: 8.9 mg/dL (ref 8.9–10.3)
CO2: 25 mmol/L (ref 22–32)
Chloride: 103 mmol/L (ref 101–111)
Creatinine, Ser: 0.76 mg/dL (ref 0.61–1.24)
GFR calc non Af Amer: >60 mL/min (ref >60)
Glucose, Bld: 88 mg/dL (ref 65–99)
Potassium: 4.1 mmol/L (ref 3.5–5.1)
Sodium: 138 mmol/L (ref 135–145)

## 2014-10-23 LAB — PROTIME-INR
INR: 1.23
Prothrombin Time: 15.7 seconds — ABNORMAL HIGH (ref 11.4–15.0)

## 2014-10-23 LAB — CBC
HCT: 44.3 % (ref 40.0–52.0)
HEMOGLOBIN: 14.8 g/dL (ref 13.0–18.0)
MCH: 30.3 pg (ref 26.0–34.0)
MCHC: 33.3 g/dL (ref 32.0–36.0)
MCV: 90.9 fL (ref 80.0–100.0)
Platelets: 224 10*3/uL (ref 150–440)
RBC: 4.87 MIL/uL (ref 4.40–5.90)
RDW: 13.6 % (ref 11.5–14.5)
WBC: 14.8 10*3/uL — ABNORMAL HIGH (ref 3.8–10.6)

## 2014-10-23 MED ORDER — APIXABAN 5 MG PO TABS: ORAL_TABLET | ORAL | Status: DC

## 2014-10-23 MED ORDER — ALPRAZOLAM 1 MG PO TABS: 1.0000 mg | ORAL_TABLET | Freq: Every evening | ORAL | Status: DC | PRN

## 2014-10-23 NOTE — Care Management (Addendum)
Discussed care with Dr Michela Pitcher. Spoke with patient for self pay resources. Patient lives with grandmother. Stated has no income and relies on Grandmother for support. Gave and explained applications for [facility] pt expressed understanding of application process. Patient will be discharged on Eliquis 30 day free trial called to [facility] [phone]. Other Rx forXanax called as well. Patient will pick up medications at discharge today. Pt understands that Xanax is out of pocket. Rx placed back on chart for nurse to give to patient. Eliquis activated free trial card given to patient. Discussed cost of medications with Dr Michela Pitcher.  Plan is for patient to return to Dr Gwinda Passe office to discuss further treatment prior to end of 30 day trial.No further CM needs Identitified.  Discussed with patient Nurse Thyra Breed and also with patient mother.

## 2014-10-23 NOTE — Discharge Instructions (Signed)

## 2014-10-23 NOTE — Discharge Summary (Signed)
Physician Discharge Summary  Patient ID: Thomas Hopkins MRN: [id number] DOB/AGE: 11-Apr-1985 29 y.o.  Admit date: 10/22/2014 Discharge date: 10/23/2014  Admission Diagnoses:  Discharge Diagnoses:  Active Problems:   Acute venous embolism and thrombosis of deep vessels of distal lower extremity   Deep venous thrombosis   Discharged Condition: good  Hospital Course: He was admitted following an ultrasound demonstrating deep venous thrombosis of both lower extremities. He was significantly symptomatic. He was started on anticoagulation and is being discharged on anticoagulation for at least 3 months. He will be evaluated in the office to decide on subsequent treatment plan. He is in agreement.  We will likely repeat his ultrasound in 3 months and make a decision with regard to completing his therapy. With the expense involved in long-term treatment we may need to switch to Coumadin therapy at that time.  Consults: None  Significant Diagnostic Studies: radiology: Ultrasound: Deep venous thrombosis  Treatments: anticoagulation: Eliquis  Discharge Exam: Blood pressure 115/53, pulse 52, temperature 97.8 F (36.6 C), temperature source Oral, resp. rate 17, height  (1.753 m), weight 256 lb (116.121 kg), SpO2 98 %. Moderate swelling both lower extremities with mild tenderness and a positive Homans sign on the left leg.  Disposition: 01-Home or Self Care  Discharge Instructions    Diet - low sodium heart healthy    Complete by:  As directed      Increase activity slowly    Complete by:  As directed             Medication List    TAKE these medications        ALPRAZolam 1 MG tablet  Commonly known as:  XANAX  Take 1 tablet (1 mg total) by mouth at bedtime as needed for anxiety.     apixaban 5 MG Tabs tablet  Commonly known as:  ELIQUIS  2 five mg tablets twice a day ( ) for a week, then 1 five mg tablet twice a day for three months     HYDROcodone-acetaminophen  5-325 MG per tablet  Commonly known as:  NORCO/VICODIN  Take 1 tablet by mouth every 4 (four) hours as needed for moderate pain.           Follow-up Information    Follow up In 1 week.   Why:  Review of sx      Signed: Tiney Rouge III 10/23/2014, 8:38 AM

## 2014-10-23 NOTE — Progress Notes (Signed)
IV was taken out, follow-up appointment was given, and discharge instructions were given. Pt and mother verbalized understanding and asked no questions. Raiford Noble, Sports coach, gave pt information regarding Eliquis.

## 2014-10-23 NOTE — Plan of Care (Signed)
Problem: Consults Goal: Venous Thromboembolism Patient Education See Patient Education Module for education specifics.  Outcome: Progressing Pt admitted for DVTs, PO Apixaban initiated. Pt received DVT and Apixaban education.

## 2014-10-30 ENCOUNTER — Ambulatory Visit (INDEPENDENT_AMBULATORY_CARE_PROVIDER_SITE_OTHER): Payer: Self-pay | Admitting: Surgery

## 2014-10-30 ENCOUNTER — Encounter: Payer: Self-pay | Admitting: Surgery

## 2014-10-30 VITALS — BP 134/69 | HR 75 | Temp 98.3°F | Ht 69.0 in | Wt 261.0 lb

## 2014-10-30 DIAGNOSIS — I82403 Acute embolism and thrombosis of unspecified deep veins of lower extremity, bilateral: Secondary | ICD-10-CM

## 2014-10-30 NOTE — Progress Notes (Signed)
Surgery Progress Note  S: No acute issues.  Leg swelling improved.  Leg pain minimal.  Has been taking eliquis for DVT.  Has been given 1 free month supply. Blood pressure 134/69, pulse 75, temperature 98.3 F (36.8 C), temperature source Oral, height  (1.753 m), weight 261 lb (118.389 kg). GEN: NAD/A&Ox3 EXT: Bilateral legs with minimal swelling  A/P 29 yo with bilateral DVT s/p lap appy. Doing well - establish PCP to possibly provide workup for hypercoagulable state, possible transition to coumadin - f/u in 2 weeks if this has not been done to transition to coumadin possibly by our office.

## 2014-10-30 NOTE — Patient Instructions (Signed)
You will need to establish a PCP. I will work on this and call you with the earliest appointment that I can get for you.  The new PCP will need to evaluate your DVT's and manage your blood thinner medications.  Our office will be calling with your appointment and instructions as soon as I get this arranged.

## 2014-11-12 ENCOUNTER — Telehealth: Payer: Self-pay

## 2014-11-12 NOTE — Telephone Encounter (Signed)
I have called [facility]--No new pt appointments till the end of October. I have also called [facility] to obtain an appointment to establish PCP. No answer. I have left a VM, This practice returns phone calls before the end of the day. I will follow up.   Also pt's mom has called and stated that the pt would like a note stating that he is under medical care to send to his Food Stamp Case worker.

## 2014-11-12 NOTE — Telephone Encounter (Signed)
Please send referral to establish a PCP for this patient. New PCP will need to manage his anticoagulation as patient had DVT post surgery.  Needs appointment as soon as possible with first available physician.

## 2014-11-13 NOTE — Telephone Encounter (Signed)
Mother called once again. She needs this letter today. I explained that letter is ready for pick-up in [place] office. She will pick this up today.

## 2014-11-19 ENCOUNTER — Ambulatory Visit (INDEPENDENT_AMBULATORY_CARE_PROVIDER_SITE_OTHER): Payer: Self-pay | Admitting: Surgery

## 2014-11-19 ENCOUNTER — Telehealth: Payer: Self-pay

## 2014-11-19 ENCOUNTER — Encounter: Payer: Self-pay | Admitting: Surgery

## 2014-11-19 VITALS — BP 120/75 | HR 64 | Temp 98.4°F | Ht 69.0 in | Wt 264.0 lb

## 2014-11-19 DIAGNOSIS — I82403 Acute embolism and thrombosis of unspecified deep veins of lower extremity, bilateral: Secondary | ICD-10-CM

## 2014-11-19 NOTE — Patient Instructions (Addendum)
Do not lift greater than 15 lbs for 6 weeks following your surgery Call or return to ER if you develop fever greater than 101.5, nausea/vomiting, increased pain, redness/drainage from incisions.  Please remember to go to your appointment on 11/28/2014 at 10:30AM at [facility], [address], [place], [place] [address] [phone]). I gave patient full information and told them that he needed to go to that appointment for his own good. I also told them that they needed to take proof of income from the entire household and proof of address. Patient understood and stated that he would go (mother was present). I faxed his records to [phone].

## 2014-11-19 NOTE — Telephone Encounter (Signed)
Call patient and ask if he went to his appointment at [facility] on 11/28/2014 at 10:30 AM. Information was given to patient's mother. They understood the importance of this appointment.

## 2014-11-19 NOTE — Progress Notes (Signed)
Surgery progress note  S: Still with Eloquis x approx 2 weeks but still hasnt obtained appt with [facility] for Coumadin management and intiation O:Blood pressure 120/75, pulse 64, temperature 98.4 F (36.9 C), temperature source Oral, height  (1.753 m), weight 264 lb (119.75 kg). GEN: NAD/A&Ox3 ABD; soft, nontender, nondistended, incisions c/d/i EXT: no obvious swelling  A/P 29 yo s/p lap appy with postop bilateral DVT.  Will continue attempt to get appt for coumadin management.  Have requested input of Dr. Orlie Dakin, hematologist, as to how to transition from eloquis, which is financially prohibitive, to coumadin.  Will f/u in 2 weeks to ensure this continues to proceed.

## 2014-12-01 ENCOUNTER — Telehealth: Payer: Self-pay

## 2014-12-01 NOTE — Telephone Encounter (Signed)
error 

## 2014-12-01 NOTE — Telephone Encounter (Signed)
Called [facility] and asked if patient was seen on his scheduled appointment on 11/28/2014. I was told that patient was seen and he was also taken care of his blood thinner. Patient also had a follow-up appointment today to be seen.

## 2016-05-04 DIAGNOSIS — F17200 Nicotine dependence, unspecified, uncomplicated: Secondary | ICD-10-CM | POA: Insufficient documentation

## 2016-06-03 ENCOUNTER — Other Ambulatory Visit: Payer: Self-pay | Admitting: Family Medicine

## 2016-06-03 DIAGNOSIS — Z86718 Personal history of other venous thrombosis and embolism: Secondary | ICD-10-CM

## 2016-11-18 ENCOUNTER — Encounter
Admission: EM | Disposition: A | Discharge status: Home / Self Care | Payer: Self-pay | Source: Home / Self Care | Attending: Vascular Surgery

## 2016-11-18 ENCOUNTER — Emergency Department: Payer: Self-pay

## 2016-11-18 ENCOUNTER — Encounter: Payer: Self-pay | Admitting: Emergency Medicine

## 2016-11-18 ENCOUNTER — Inpatient Hospital Stay
Admission: EM | Admit: 2016-11-18 | Discharge: 2016-11-19 | DRG: 272 | Disposition: A | Discharge status: Home / Self Care | Payer: Self-pay | Attending: Vascular Surgery | Admitting: Vascular Surgery

## 2016-11-18 DIAGNOSIS — Z800 Family history of malignant neoplasm of digestive organs: Secondary | ICD-10-CM

## 2016-11-18 DIAGNOSIS — Z9049 Acquired absence of other specified parts of digestive tract: Secondary | ICD-10-CM

## 2016-11-18 DIAGNOSIS — M79604 Pain in right leg: Secondary | ICD-10-CM

## 2016-11-18 DIAGNOSIS — Z885 Allergy status to narcotic agent status: Secondary | ICD-10-CM

## 2016-11-18 DIAGNOSIS — I739 Peripheral vascular disease, unspecified: Secondary | ICD-10-CM

## 2016-11-18 DIAGNOSIS — I7090 Unspecified atherosclerosis: Secondary | ICD-10-CM

## 2016-11-18 DIAGNOSIS — Z86718 Personal history of other venous thrombosis and embolism: Secondary | ICD-10-CM

## 2016-11-18 DIAGNOSIS — I998 Other disorder of circulatory system: Secondary | ICD-10-CM | POA: Diagnosis present

## 2016-11-18 DIAGNOSIS — Z7901 Long term (current) use of anticoagulants: Secondary | ICD-10-CM

## 2016-11-18 DIAGNOSIS — F17200 Nicotine dependence, unspecified, uncomplicated: Secondary | ICD-10-CM | POA: Diagnosis present

## 2016-11-18 DIAGNOSIS — I70221 Atherosclerosis of native arteries of extremities with rest pain, right leg: Principal | ICD-10-CM | POA: Diagnosis present

## 2016-11-18 DIAGNOSIS — M79673 Pain in unspecified foot: Secondary | ICD-10-CM | POA: Diagnosis present

## 2016-11-18 DIAGNOSIS — I70211 Atherosclerosis of native arteries of extremities with intermittent claudication, right leg: Secondary | ICD-10-CM

## 2016-11-18 HISTORY — DX: Acute embolism and thrombosis of unspecified deep veins of lower extremity, bilateral: I82.403

## 2016-11-18 HISTORY — PX: ABDOMINAL AORTOGRAM W/LOWER EXTREMITY: CATH118223

## 2016-11-18 LAB — CBC WITH DIFFERENTIAL/PLATELET
BASOS ABS: 0.2 10*3/uL — AB (ref 0–0.1)
BASOS PCT: 1 %
EOS PCT: 2 %
Eosinophils Absolute: 0.3 10*3/uL (ref 0–0.7)
HCT: 43.4 % (ref 40.0–52.0)
Hemoglobin: 15.1 g/dL (ref 13.0–18.0)
Lymphocytes Relative: 29 %
Lymphs Abs: 3.5 10*3/uL (ref 1.0–3.6)
MCH: 30.5 pg (ref 26.0–34.0)
MCHC: 34.7 g/dL (ref 32.0–36.0)
MCV: 88 fL (ref 80.0–100.0)
MONO ABS: 0.9 10*3/uL (ref 0.2–1.0)
Monocytes Relative: 8 %
NEUTROS ABS: 7.4 10*3/uL — AB (ref 1.4–6.5)
Neutrophils Relative %: 60 %
PLATELETS: 253 10*3/uL (ref 150–440)
RBC: 4.94 MIL/uL (ref 4.40–5.90)
RDW: 13.4 % (ref 11.5–14.5)
WBC: 12.3 10*3/uL — AB (ref 3.8–10.6)

## 2016-11-18 LAB — BASIC METABOLIC PANEL
Anion gap: 7 (ref 5–15)
BUN: 9 mg/dL (ref 6–20)
CO2: 23 mmol/L (ref 22–32)
CREATININE: 0.68 mg/dL (ref 0.61–1.24)
Calcium: 8.8 mg/dL — ABNORMAL LOW (ref 8.9–10.3)
Chloride: 109 mmol/L (ref 101–111)
GFR calc Af Amer: >60 mL/min (ref >60)
GLUCOSE: 91 mg/dL (ref 65–99)
Potassium: 3.8 mmol/L (ref 3.5–5.1)
Sodium: 139 mmol/L (ref 135–145)

## 2016-11-18 LAB — APTT: APTT: 29 s (ref 24–36)

## 2016-11-18 LAB — PROTIME-INR
INR: 1.05
Prothrombin Time: 13.6 seconds (ref 11.4–15.2)

## 2016-11-18 LAB — HEPARIN LEVEL (UNFRACTIONATED): Heparin Unfractionated: 0.31 IU/mL (ref 0.30–0.70)

## 2016-11-18 SURGERY — ABDOMINAL AORTOGRAM W/LOWER EXTREMITY
Anesthesia: Moderate Sedation | Laterality: Right

## 2016-11-18 SURGERY — LOWER EXTREMITY INTERVENTION
Anesthesia: Moderate Sedation

## 2016-11-18 MED ORDER — FENTANYL CITRATE (PF) 100 MCG/2ML IJ SOLN
INTRAMUSCULAR | Status: AC
  Filled 2016-11-18: qty 2

## 2016-11-18 MED ORDER — ALTEPLASE 2 MG IJ SOLR
INTRAMUSCULAR | Status: DC | PRN
  Administered 2016-11-18: 10 mg

## 2016-11-18 MED ORDER — MORPHINE SULFATE (PF) 4 MG/ML IV SOLN: 2.0000 mg | INTRAVENOUS | Status: DC | PRN

## 2016-11-18 MED ORDER — SODIUM CHLORIDE 0.9 % IV SOLN
INTRAVENOUS | Status: AC
  Administered 2016-11-18 (×2): via INTRAVENOUS

## 2016-11-18 MED ORDER — IOPAMIDOL (ISOVUE-370) INJECTION 76%
125.0000 mL | Freq: Once | INTRAVENOUS | Status: AC | PRN
  Administered 2016-11-18: 125 mL via INTRAVENOUS

## 2016-11-18 MED ORDER — SODIUM CHLORIDE 0.9 % IV SOLN: 250.0000 mL | INTRAVENOUS | Status: DC | PRN

## 2016-11-18 MED ORDER — CLONIDINE HCL 0.1 MG PO TABS: 0.2000 mg | ORAL_TABLET | ORAL | Status: DC | PRN

## 2016-11-18 MED ORDER — ATORVASTATIN CALCIUM 20 MG PO TABS
20.0000 mg | ORAL_TABLET | Freq: Every day | ORAL | Status: DC
  Administered 2016-11-18: 20 mg via ORAL
  Filled 2016-11-18: qty 1

## 2016-11-18 MED ORDER — HEPARIN BOLUS VIA INFUSION
6000.0000 [IU] | Freq: Once | INTRAVENOUS | Status: AC
  Administered 2016-11-18: 6000 [IU] via INTRAVENOUS
  Filled 2016-11-18: qty 6000

## 2016-11-18 MED ORDER — ALTEPLASE 2 MG IJ SOLR
INTRAMUSCULAR | Status: AC
  Filled 2016-11-18: qty 10

## 2016-11-18 MED ORDER — HEPARIN (PORCINE) IN NACL 100-0.45 UNIT/ML-% IJ SOLN
1500.0000 [IU]/h | INTRAMUSCULAR | Status: DC
  Administered 2016-11-18: 1500 [IU]/h via INTRAVENOUS
  Filled 2016-11-18 (×2): qty 250

## 2016-11-18 MED ORDER — NICOTINE 14 MG/24HR TD PT24
14.0000 mg | MEDICATED_PATCH | Freq: Every day | TRANSDERMAL | Status: DC
  Administered 2016-11-18 – 2016-11-19 (×2): 14 mg via TRANSDERMAL
  Filled 2016-11-18 (×2): qty 1

## 2016-11-18 MED ORDER — OXYCODONE HCL 5 MG PO TABS
ORAL_TABLET | ORAL | Status: AC
  Administered 2016-11-18: 23:00:00
  Filled 2016-11-18: qty 2

## 2016-11-18 MED ORDER — NITROGLYCERIN 5 MG/ML IV SOLN
INTRAVENOUS | Status: AC
  Filled 2016-11-18: qty 10

## 2016-11-18 MED ORDER — FENTANYL CITRATE (PF) 100 MCG/2ML IJ SOLN
INTRAMUSCULAR | Status: DC | PRN
  Administered 2016-11-18 (×2): 50 ug via INTRAVENOUS
  Administered 2016-11-18 (×2): 25 ug via INTRAVENOUS
  Administered 2016-11-18: 50 ug via INTRAVENOUS

## 2016-11-18 MED ORDER — MIDAZOLAM HCL 2 MG/2ML IJ SOLN
INTRAMUSCULAR | Status: DC | PRN
  Administered 2016-11-18: 2 mg via INTRAVENOUS
  Administered 2016-11-18 (×3): 1 mg via INTRAVENOUS

## 2016-11-18 MED ORDER — OXYCODONE HCL 5 MG PO TABS
5.0000 mg | ORAL_TABLET | ORAL | Status: DC | PRN
  Administered 2016-11-18 – 2016-11-19 (×3): 10 mg via ORAL
  Filled 2016-11-18 (×2): qty 2

## 2016-11-18 MED ORDER — HEPARIN SODIUM (PORCINE) 1000 UNIT/ML IJ SOLN
INTRAMUSCULAR | Status: DC | PRN
  Administered 2016-11-18: 4000 [IU] via INTRAVENOUS

## 2016-11-18 MED ORDER — SODIUM CHLORIDE 0.9% FLUSH: 3.0000 mL | INTRAVENOUS | Status: DC | PRN

## 2016-11-18 MED ORDER — ASPIRIN EC 81 MG PO TBEC
81.0000 mg | DELAYED_RELEASE_TABLET | Freq: Every day | ORAL | Status: DC
  Administered 2016-11-18 – 2016-11-19 (×2): 81 mg via ORAL
  Filled 2016-11-18 (×2): qty 1

## 2016-11-18 MED ORDER — DIPHENHYDRAMINE HCL 50 MG/ML IJ SOLN
INTRAMUSCULAR | Status: DC | PRN
  Administered 2016-11-18 (×2): 12.5 mg via INTRAVENOUS

## 2016-11-18 MED ORDER — HEPARIN (PORCINE) IN NACL 100-0.45 UNIT/ML-% IJ SOLN
1500.0000 [IU]/h | INTRAMUSCULAR | Status: DC
  Administered 2016-11-19: 1500 [IU]/h via INTRAVENOUS
  Filled 2016-11-18 (×4): qty 250

## 2016-11-18 MED ORDER — HEPARIN (PORCINE) IN NACL 2-0.9 UNIT/ML-% IJ SOLN
INTRAMUSCULAR | Status: AC
  Filled 2016-11-18: qty 1000

## 2016-11-18 MED ORDER — DIPHENHYDRAMINE HCL 50 MG/ML IJ SOLN
INTRAMUSCULAR | Status: AC
  Filled 2016-11-18: qty 1

## 2016-11-18 MED ORDER — IOPAMIDOL (ISOVUE-300) INJECTION 61%
INTRAVENOUS | Status: DC | PRN
  Administered 2016-11-18: 60 mL via INTRA_ARTERIAL

## 2016-11-18 MED ORDER — HEPARIN SODIUM (PORCINE) 5000 UNIT/ML IJ SOLN: 4000.0000 [IU] | Freq: Once | INTRAMUSCULAR | Status: DC

## 2016-11-18 MED ORDER — NITROGLYCERIN 1 MG/10 ML FOR IR/CATH LAB
INTRA_ARTERIAL | Status: DC | PRN
  Administered 2016-11-18: 250 ug
  Administered 2016-11-18 (×3): 500 ug

## 2016-11-18 MED ORDER — HEPARIN SODIUM (PORCINE) 1000 UNIT/ML IJ SOLN
INTRAMUSCULAR | Status: AC
  Filled 2016-11-18: qty 1

## 2016-11-18 MED ORDER — MIDAZOLAM HCL 5 MG/5ML IJ SOLN
INTRAMUSCULAR | Status: AC
  Filled 2016-11-18: qty 5

## 2016-11-18 MED ORDER — CEFAZOLIN SODIUM-DEXTROSE 2-4 GM/100ML-% IV SOLN
2.0000 g | INTRAVENOUS | Status: AC
  Administered 2016-11-18: 2 g via INTRAVENOUS
  Filled 2016-11-18: qty 100

## 2016-11-18 MED ORDER — SODIUM CHLORIDE 0.9% FLUSH
3.0000 mL | Freq: Two times a day (BID) | INTRAVENOUS | Status: DC
  Administered 2016-11-19: 3 mL via INTRAVENOUS

## 2016-11-18 MED ORDER — ONDANSETRON HCL 4 MG/2ML IJ SOLN: 4.0000 mg | Freq: Four times a day (QID) | INTRAMUSCULAR | Status: DC | PRN

## 2016-11-18 MED ORDER — ACETAMINOPHEN 325 MG PO TABS: 650.0000 mg | ORAL_TABLET | ORAL | Status: DC | PRN

## 2016-11-18 MED ORDER — LIDOCAINE HCL (PF) 1 % IJ SOLN
INTRAMUSCULAR | Status: AC
  Filled 2016-11-18: qty 30

## 2016-11-18 MED ORDER — HEPARIN (PORCINE) IN NACL 100-0.45 UNIT/ML-% IJ SOLN: 18.0000 [IU]/kg/h | Freq: Once | INTRAMUSCULAR | Status: DC

## 2016-11-18 MED ORDER — SODIUM CHLORIDE 0.9 % IJ SOLN
INTRAMUSCULAR | Status: AC
  Filled 2016-11-18: qty 50

## 2016-11-18 SURGICAL SUPPLY — 25 items
BALLN LUTONIX DCB 4X80X130 (BALLOONS) ×3
BALLN ULTRVRSE 3X150X130 (BALLOONS) ×3
BALLOON LUTONIX DCB 4X80X130 (BALLOONS) ×1 IMPLANT
BALLOON ULTRVRSE 3X150X130 (BALLOONS) ×1 IMPLANT
CANISTER PENUMBRA MAX (MISCELLANEOUS) ×2 IMPLANT
CATH BEACON 5 .035 65 RIM TIP (CATHETERS) ×3 IMPLANT
CATH INDIGO 3 ST-TIP 150CM (CATHETERS) ×3 IMPLANT
CATH INDIGO 6 ST-TIP 135CM (CATHETERS) ×2 IMPLANT
COVER PROBE U/S 5X48 (MISCELLANEOUS) ×3 IMPLANT
DEVICE PRESTO INFLATION (MISCELLANEOUS) ×3 IMPLANT
DEVICE SAFEGUARD 24CM (GAUZE/BANDAGES/DRESSINGS) ×6 IMPLANT
DEVICE STARCLOSE SE CLOSURE (Vascular Products) ×3 IMPLANT
DEVICE TORQUE (MISCELLANEOUS) ×3 IMPLANT
GLIDECATH ANGLED 4FR 120CM (CATHETERS) ×3 IMPLANT
GLIDEWIRE ANGLED SS 035X260CM (WIRE) ×3 IMPLANT
NEEDLE ENTRY 21GA 7CM ECHOTIP (NEEDLE) ×3 IMPLANT
PACK ANGIOGRAPHY (CUSTOM PROCEDURE TRAY) ×3 IMPLANT
SET INTRO CAPELLA COAXIAL (SET/KITS/TRAYS/PACK) ×3 IMPLANT
SHEATH BRITE TIP 5FRX11 (SHEATH) ×3 IMPLANT
SHEATH PINNACLE MP 7F 45CM (SHEATH) ×3 IMPLANT
SHIELD RADPAD SCOOP 12X17 (MISCELLANEOUS) ×6 IMPLANT
TOWEL OR 17X26 4PK STRL BLUE (TOWEL DISPOSABLE) ×3 IMPLANT
TUBING ASPIRATION INDIGO (MISCELLANEOUS) ×2 IMPLANT
WIRE G V18X300CM (WIRE) ×3 IMPLANT
WIRE MAGIC TORQUE 260C (WIRE) ×3 IMPLANT

## 2016-11-18 NOTE — Consult Note (Addendum)
ANTICOAGULATION CONSULT NOTE - FOLLOW UP  Consult  Pharmacy Consult for heparin drip Indication: DVT  Allergies  Allergen Reactions  . Vicodin [Hydrocodone-Acetaminophen] Nausea And Vomiting    Headache    Patient Measurements: Height: 5\' 9"  (175.3 cm) Weight: 280 lb (127 kg) IBW/kg (Calculated) : 70.7 Heparin Dosing Weight: 100kg  Vital Signs: Temp: 98.4 F (36.9 C) (09/07 1210) Temp Source: Oral (09/07 1210) BP: 110/71 (09/07 1645) Pulse Rate: 56 (09/07 1645)  Labs:  Recent Labs  11/18/16 0914  HGB 15.1  HCT 43.4  PLT 253  APTT 29  LABPROT 13.6  INR 1.05  CREATININE 0.68    Estimated Creatinine Clearance: 178 mL/min (by C-G formula based on SCr of 0.68 mg/dL).   Medical History: Past Medical History:  Diagnosis Date  . DVT of lower extremity, bilateral (HCC)    stopped Eliquis in 2017    Medications:  Scheduled:  . oxyCODONE      . aspirin EC  81 mg Oral Daily  . atorvastatin  20 mg Oral q1800  . sodium chloride flush  3 mL Intravenous Q12H    Assessment: Pt is a 31 year old male with a hx of DVT who presents with foot cold to the touch. Pt was taken off of apixaban 6 months ago. Pharmacy consulted to dose heparin drip for possible DVT or arterial occlussion. Baseline labs ordered  Goal of Therapy:  Heparin level 0.3-0.7 units/ml Monitor platelets by anticoagulation protocol: Yes   Plan:  Give 6000 units bolus x 1 Start heparin infusion at 1500 units/hr Check anti-Xa level in 6 hours and daily while on heparin Continue to monitor H&H and platelets  ADD: MD Schnier wanted to restart Heparin gtt @ 1600 without a bolus. Will recheck Heparin level @ 2230. Per MD if level not within range then follow protocol with bolus and rate gtt increases.  Demetrius Charityeldrin D. James, PharmD Clinical Pharmacist  11/18/2016,4:47 PM  9/7 2300 heparin level 0.31. Continue current regimen. Recheck heparin level and CBC with tomorrow AM labs.  9/8 AM heparin level 0.39.  Continue current regimen. Recheck heparin level and CBC with tomorrow AM labs.   Fulton ReekMatt Miklo Aken, PharmD, BCPS  11/18/16 11:49 PM

## 2016-11-18 NOTE — Progress Notes (Signed)
RN removed foley per verbal order from on call vascular surgeon. Pt is due to void by 0100 11/19/2016. Will relay the message to the oncoming nurse.   [place] Hopkins [place]

## 2016-11-18 NOTE — Op Note (Signed)
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 Introduction catheter into right lower extremity 3rd order catheter placement with additional third order 2. Contrast injection right lower extremity for distal runoff with additional 3rd order  3. Percutaneous transluminal angioplasty to 4 mm using the Lutonix drug-eluting balloon right posterior tibial artery              4. Percutaneous transluminal angioplasty to 3 mm right anterior tibial artery  5. mechanical thromboembolectomy of the right tibioperoneal trunk and posterior tibial artery using the penumbra 6.              6.  Mechanical thrombectomy of the right anterior tibial artery using the penumbra cat 6 device              7.  Star close closure left common femoral arteriotomy  Anesthesia: Conscious sedation was administered under my direct supervision by the interventional radiology RN. IV Versed plus fentanyl were utilized. Continuous ECG, pulse oximetry and blood pressure was monitored throughout the entire procedure.  Conscious sedation was for a total of 1 hour 17 minutes.  Sheath: 6 French destination sheath left common femoral  Contrast: 60 cc  Fluoroscopy Time: 15.4 minutes  Indications: DONAVYN FECHER presents with increasing pain of the right lower extremity. Physical examination was consistent with ischemic foot. CT angiogram suggested atheromatous debris in the distal aorta had embolized to the right trifurcation. This suggests the patient is having limb threatening ischemia. The risks and benefits are reviewed all questions  answered patient agrees to proceed.  Procedure:Nikolus B Fulghum is a 31 y.o. y.o. male who was identified and appropriate procedural time out was performed. The patient was then placed supine on the table and prepped and draped in the usual sterile fashion.   Ultrasound was placed in the sterile sleeve and the left groin was evaluated the left common femoral artery was echolucent and pulsatile indicating patency. Image was recorded for the permanent record and under real-time visualization a microneedle was inserted into the common femoral artery followed by the microwire and then the micro-sheath. A J-wire was then advanced through the micro-sheath and a 5 [place] sheath was then inserted over a J-wire. J-wire was then advanced and a 5 Rim catheter was positioned at the bifurcation.  AP projection of the distal aorta was then obtained as well as and LAO projection of the iliacs on the right.  Glidewire was used to cross the aortic bifurcation the catheter wire were advanced down into the right distal external iliac artery. Oblique view of the femoral bifurcation was then obtained and subsequently the wire was reintroduced and the pigtail catheter negotiated into the SFA representing third order catheter placement. Distal runoff was then performed.  5000 units of heparin was then given and allowed to circulate and a 6 [place] destination sheath was advanced up and over the bifurcation and positioned in the femoral artery  Straight catheter and stiff angle Glidewire were then negotiated down into the distal popliteal. Catheter was then advanced. Hand injection contrast demonstrated the tibial anatomy in detail.    10 mg of TPA was then reconstituted in approximately 25 cc and this was laced into the occlusion was a both the anterior tibial artery as well as the  tibioperoneal trunk and posterior tibial arteries. This required introduction of catheter into each of these individual tibial arteries  representing additional third order catheter placement. Hand injection of contrast was also performed at this time to demonstrate distal runoff.  The penumbra cat 6 device was then used to make multiple passes through thetibioperoneal trunk and into the posterior tibial. Follow-up imaging demonstrated an 80% narrowing of the tibioperoneal trunk and proximal posterior tibial. This was treated with a 4 mm Lutonix drug-eluting balloon inflated to 10 atm for 2 minutes. Follow-up imaging demonstrated resolution of this stricture with less than 10% residual stenosis. Also of note the peroneal was now brought back into circulation and is filling without defect.  The catheter was then repositioned and the wire catheter negotiated back into the anterior tibial where the penumbra cat 6 device was then used to make multiple passes in the anterior tibial artery. This reestablished flow although there was a diffuse narrowing or stricture noted. A 3 mm x 15 cm ultra versed balloon was then used and plasty the anterior tibial.  In what inflation was to 10 atm for 1 minute. Follow-up imaging demonstrated restoration of flow with less than 20% residual stenosis.  Vasculature at the ankle level was then reassessed. It was noted that there was further embolus in the posterior tibial at the level of the ankle just proximal to the lateral and medial plantar bifurcation.And therefore a penumbra cat 6 device was utilized in this location as well. Multiple plasty were made with reestablished of flow into the plantar vessels.  After review of these images the sheath is pulled into the left external iliac oblique of the common femoral is obtained and a Star close device deployed. There no immediate Complications.  Findings: The abdominal aorta was assessed by CT angiography and therefore conventional angiography was not performed. The rightcommon and external iliac arteries are widely patent although there is some atheroma noted  in the origin of the right common iliac.  The right common femoral is widely patent as is the profunda femoris.  The SFA and popliteal are without hemodynamically significant lesions..  The trifurcation is diseased with occlusion of the anterior tibial, peroneal and posterior tibial arteries.   The appearance is consistent with embolization. There is reconstitution distally although later in the case further embolization of the distal most posterior tibial was also identified.  .  Following angioplasty the posterior tibial  tibial now is now widely patent and demonstrates in-line flow.  It is much improved and looks quite nice. Angioplasty of the anterior tibial also yields an excellent result with less than 10% residual stenosis.  Summary: Successful recanalization right lower extremity for limb salvage   Disposition: Patient was taken to the recovery room in stable condition having tolerated the procedure well.  Belenda Cruise Schnier 11/18/2016,7:55 PM

## 2016-11-18 NOTE — H&P (Signed)
[facility] Admission History & Physical  MRN : [id number]  Thomas Hopkins is a 31 y.o. (1985-06-12) male who presents with chief complaint of  Chief Complaint  Patient presents with  . Foot Pain  .  History of Present Illness: the patient is a 31 year old gentleman who presented to [facility] emergency room earlier today with the complaint of a day and a half of discomfort in his foot. He told the emergency room physician and began proximally 14 hours ago but to me he described the beginning of the symptoms beyond that. He noted it began in his toe then progressed to encompass his entire foot gradually.He states his foot feels very cold and is painful. He denies trauma. 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 He did see a hematologist but considering this was his first DVT he and that he could not afford the cost of a hyperactive coag of a workup no further testing was done at that time.  He denies a family history of antegrade relation but heis uncertain about his father's past medical history  Duplex ultrasound of the venous system obtained today is negative for DVT  Current Facility-Administered Medications  Medication Dose Route Frequency Provider Last Rate Last Dose  . heparin ADULT infusion 100 units/mL (25000 units/29850mL sodium chloride 0.45%)  1,500 Units/hr Intravenous Continuous Olene FlossMaccia, Melissa D, RPH 15 mL/hr at 11/18/16 0920 1,500 Units/hr at 11/18/16 0920   Current Outpatient Prescriptions  Medication Sig Dispense Refill  . acetaminophen (TYLENOL) 500 MG tablet Take 500 mg by mouth every 6 (six) hours as needed.    Marland Kitchen. apixaban (ELIQUIS) 5 MG TABS tablet 2 five mg tablets twice a day (10mg ) for a week, then 1 five mg tablet twice a day for three months 60 tablet 2    Past Medical History:  Diagnosis Date  . DVT of lower  extremity, bilateral (HCC)    stopped Eliquis in 2017    Past Surgical History:  Procedure Laterality Date  . LAPAROSCOPIC APPENDECTOMY N/A 10/14/2014   Procedure: APPENDECTOMY LAPAROSCOPIC;  Surgeon: Lattie Hawichard E Cooper, MD;  Location: [facility] ORS;  Service: General;  Laterality: N/A;  . TONSILLECTOMY      Social History Social History  Substance Use Topics  . Smoking status: Current Some Day Smoker    Packs/day: 1.00    Years: 15.00  . Smokeless tobacco: Never Used  . Alcohol use Yes     Comment: rare consumption    Family History Family History  Problem Relation Age of Onset  . Diabetes Mother   . Diabetes Mellitus II Maternal Grandmother   . Rectal cancer Father   No family history of bleeding/clotting disorders, porphyria or autoimmune disease   Allergies  Allergen Reactions  . Vicodin [Hydrocodone-Acetaminophen] Nausea And Vomiting    Headache     REVIEW OF SYSTEMS (Negative unless checked)  Constitutional: [] Weight loss  [] Fever  [] Chills Cardiac: [] Chest pain   [] Chest pressure   [] Palpitations   [] Shortness of breath when laying flat   [] Shortness of breath at rest   [] Shortness of breath with exertion. Vascular:  [x] Pain in legs with walking   [x] Pain in legs at rest   [] Pain in legs when laying flat   [] Claudication   [] Pain in feet when walking  [] Pain in feet at rest  [] Pain in feet when laying flat   [  x]History of DVT   Phlebitis   Swelling in legs   Varicose veins   Non-healing ulcers Pulmonary:   Uses home oxygen   Productive cough   Hemoptysis   Wheeze  COPD   Asthma Neurologic:  Dizziness  Blackouts   Seizures   History of stroke   History of TIA  Aphasia   Temporary blindness   Dysphagia   Weakness or numbness in arms    numbness in right leg Musculoskeletal:  Arthritis   Joint swelling   Joint pain   Low back pain Hematologic:  Easy bruising  Easy bleeding   Hypercoagulable state   Anemic   Hepatitis Gastrointestinal:  Blood in stool   Vomiting blood  Gastroesophageal reflux/heartburn   Difficulty swallowing. Genitourinary:  Chronic kidney disease   Difficult urination  Frequent urination  Burning with urination   Blood in urine Skin:  Rashes   Ulcers   Wounds Psychological:  History of anxiety    History of major depression.  Physical Examination  Vitals:   11/18/16 0842 11/18/16 1037 11/18/16 1038 11/18/16 1100  BP: 118/61 116/73  121/76  Pulse: (!) 56  (!) 58 60  Resp: 18     Temp: 98.5 F (36.9 C)     TempSrc: Oral     SpO2: 97%  96% 97%  Weight:      Height:       Body mass index is 41.35 kg/m. Gen: WD/WN, NAD Head: [place]/AT, No temporalis wasting. Prominent temp pulse not noted. Ear/Nose/Throat: Hearing grossly intact, nares w/o erythema or drainage, oropharynx w/o Erythema/Exudate,  Eyes: Conjunctiva clear, sclera non-icteric Neck: Trachea midline.  No JVD.  Pulmonary:  Good air movement, respirations not labored, no use of accessory muscles.  Cardiac: RRR, normal S1, S2. Vascular: right foot is cool but remains pink with 4+ second capillary refill left foot is warm with instantaneous capillary refill and easily palpable pulses Vessel Right Left  Radial Palpable Palpable  Popliteal notPalpable Palpable  PT notPalpable Palpable  DP notPalpable Palpable  Gastrointestinal: soft, non-tender/non-distended. No guarding/reflex.  Musculoskeletal: M/S 5/5 throughout.  Left lower extremity without ischemic changes right lower extremity with moderate to severe ischemic changes.  No deformity or atrophy.  Neurologic: Sensation grossly intact in extremities.  Symmetrical.  Speech is fluent. Motor exam as listed above. Psychiatric: Judgment intact, Mood & affect appropriate for pt's clinical situation. Dermatologic: No rashes or ulcers noted.  No cellulitis or open wounds. Lymph : No Cervical, Axillary, or Inguinal  lymphadenopathy.     CBC Lab Results  Component Value Date   WBC 12.3 (H) 11/18/2016   HGB 15.1 11/18/2016   HCT 43.4 11/18/2016   MCV 88.0 11/18/2016   PLT 253 11/18/2016    BMET    Component Value Date/Time   NA 139 11/18/2016 0914   K 3.8 11/18/2016 0914   CL 109 11/18/2016 0914   CO2 23 11/18/2016 0914   GLUCOSE 91 11/18/2016 0914   BUN 9 11/18/2016 0914   CREATININE 0.68 11/18/2016 0914   CALCIUM 8.8 (L) 11/18/2016 0914   GFRNONAA >60 11/18/2016 0914   GFRAA >60 11/18/2016 0914   Estimated Creatinine Clearance: 178 mL/min (by C-G formula based on SCr of 0.68 mg/dL).  COAG Lab Results  Component Value Date   INR 1.05 11/18/2016   INR 1.23 10/23/2014    Radiology US Venous Img Lower Unilateral Right  Result Date: 11/18/2016 CLINICAL DATA:  Painful right foot, cold foot. EXAM: RIGHT LOWER EXTREMITY VENOUS  DOPPLER ULTRASOUND TECHNIQUE: Gray-scale sonography with graded compression, as well as color Doppler and duplex ultrasound were performed to evaluate the lower extremity deep venous systems from the level of the common femoral vein and including the common femoral, femoral, profunda femoral, popliteal and calf veins including the posterior tibial, peroneal and gastrocnemius veins when visible. The superficial great saphenous vein was also interrogated. Spectral Doppler was utilized to evaluate flow at rest and with distal augmentation maneuvers in the common femoral, femoral and popliteal veins. COMPARISON:  None. FINDINGS: Contralateral Common Femoral Vein: Respiratory phasicity is normal and symmetric with the symptomatic side. No evidence of thrombus. Normal compressibility. Common Femoral Vein: No evidence of thrombus. Normal compressibility, respiratory phasicity and response to augmentation. Saphenofemoral Junction: No evidence of thrombus. Normal compressibility and flow on color Doppler imaging. Profunda Femoral Vein: No evidence of thrombus. Normal compressibility  and flow on color Doppler imaging. Femoral Vein: No evidence of thrombus. Normal compressibility, respiratory phasicity and response to augmentation. Popliteal Vein: No evidence of thrombus. Normal compressibility, respiratory phasicity and response to augmentation. Calf Veins: No evidence of thrombus. Normal compressibility and flow on color Doppler imaging. Superficial Great Saphenous Vein: No evidence of thrombus. Normal compressibility and flow on color Doppler imaging. Venous Reflux:  None. Other Findings:  None. IMPRESSION: No evidence of DVT within the right lower extremity. Electronically Signed   By: Charlett Nose M.D.   On: 11/18/2016 10:38     Assessment/Plan 1. Ischemic right foot:  Patient has been initiated on heparin drip. I am highly suspicious this represents either Buerger's disease or a manifestation of the hypercoagulable state which is somewhat undefined. Duplex ultrasound of the venous system is negative. CT angiogram is pending. I will plan for the patient to go to angiography with the hope for intervention and reestablishing improved perfusion to save his foot.  The risks and benefits for angiography were discussed QUESTIONS answered patient agrees to proceed. He asks that everything that can be done be done to save his foot. 2. Hypercoagulable state: Given his past history of DVT and now this new vascular issue it does appear he is hypercoagulable. Workup will be done during this admission. Continue heparin drip for now. 3. Tobacco use: Patient will be counseled for smoking cessation and form that should he continue to use tobacco at all that he is likely to progress to .   Levora Dredge, MD  11/18/2016 11:28 AM

## 2016-11-18 NOTE — ED Triage Notes (Signed)
Pt here with c/o worsening right foot pain over the past few days, states he has a hx of DVT last year, states his foot is cold to the touch, was taken off blood thinner 6 months ago.

## 2016-11-18 NOTE — OR Nursing (Signed)
Report called to RN for room 213. Review of meds and orders. Left groin without bleeding or hematoma at this time. PAD device intact.

## 2016-11-18 NOTE — ED Notes (Signed)
Patient transported to CT 

## 2016-11-18 NOTE — Consult Note (Signed)
ANTICOAGULATION CONSULT NOTE - Initial Consult  Pharmacy Consult for heparin drip Indication: DVT  Allergies  Allergen Reactions  . Vicodin [Hydrocodone-Acetaminophen] Nausea And Vomiting    Headache    Patient Measurements: Height: 5\' 9"  (175.3 cm) Weight: 280 lb (127 kg) IBW/kg (Calculated) : 70.7 Heparin Dosing Weight: 100kg  Vital Signs: Temp: 98.5 F (36.9 C) (09/07 0842) Temp Source: Oral (09/07 0842) BP: 118/61 (09/07 0842) Pulse Rate: 56 (09/07 0842)  Labs: No results for input(s): HGB, HCT, PLT, APTT, LABPROT, INR, HEPARINUNFRC, HEPRLOWMOCWT, CREATININE, CKTOTAL, CKMB, TROPONINI in the last 72 hours.  CrCl cannot be calculated (Patient's most recent lab result is older than the maximum 21 days allowed.).   Medical History: History reviewed. No pertinent past medical history.  Medications:  Scheduled:  . heparin  6,000 Units Intravenous Once    Assessment: Pt is a 31 year old male with a hx of DVT who presents with foot cold to the touch. Pt was taken off of apixaban 6 months ago. Pharmacy consulted to dose heparin drip for possible DVT or arterial occlussion. Baseline labs ordered  Goal of Therapy:  Heparin level 0.3-0.7 units/ml Monitor platelets by anticoagulation protocol: Yes   Plan:  Give 6000 units bolus x 1 Start heparin infusion at 1500 units/hr Check anti-Xa level in 6 hours and daily while on heparin Continue to monitor H&H and platelets  Jaion Lagrange D Zaylie Gisler, Pharm.D, BCPS Clinical Pharmacist  11/18/2016,9:15 AM

## 2016-11-18 NOTE — ED Notes (Signed)
Vital signs in triage and then will be take to room 1.

## 2016-11-18 NOTE — Progress Notes (Signed)
Pt arrived to room 213. Pt is drowsy , no pain, VSS, left groin incision assessed with Calley RN at bedside. Incision site looks clean, dry, and no bruising or bleeding noted. PAD dressing intact and inflated with 40 ml of air. RN went over post op orders with pt and pt verbalized understanding. Will continue to monitor pt closely.   Shashana Fullington [employer]

## 2016-11-18 NOTE — ED Provider Notes (Signed)
[facility] Emergency Department Provider Note  ____________________________________________   First MD Initiated Contact with Patient 11/18/16 [phone]     (approximate)  I have reviewed the triage vital signs and the nursing notes.   HISTORY  Chief Complaint Foot Pain    HPI Thomas Hopkins is a 31 y.o. 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  The symptoms started about 14 hours ago.  At first he did not think much of it but it got gradually worse over the course of the night and until he presented here to the emergency department. he states that his foot felt very cold last night and he tried putting warm blankets on it but it did not help.  Either he nor his family member could feel a pulse.  When it was not better by this morning he felt he should get it evaluated.  The pain is worse with ambulation.  He states it is not quite as numb as it was before but it still has decreased sensation compared to the left.  He has no pain at all and the left leg, but he does state that he had bilateral lower extremity DVTs couple of years ago.  He states he has not been on Eliquis for almost a year although he did have some left over so he took one either last night or this morning because he thought it might help.  He describes the symptoms as severe, the pain as aching, and nothing makes it better.  He denies fever/chills, chest pain, shortness of breath, nausea, vomiting, and abdominal pain.  He denies any recent trauma or injury to the foot and has not had any recent surgeries.  He does smoke occasionally.   Past Medical History:  Diagnosis Date  . DVT of lower extremity, bilateral (HCC)    stopped Eliquis in 2017    Patient Active Problem List   Diagnosis Date Noted  . Acute venous embolism and thrombosis of deep  vessels of distal lower extremity (HCC) 10/22/2014  . Deep venous thrombosis (HCC) 10/22/2014  . RLQ abdominal pain   . Abdominal pain, acute, right lower quadrant 10/13/2014    Past Surgical History:  Procedure Laterality Date  . LAPAROSCOPIC APPENDECTOMY N/A 10/14/2014   Procedure: APPENDECTOMY LAPAROSCOPIC;  Surgeon: Lattie Haw, MD;  Location: [facility] ORS;  Service: General;  Laterality: N/A;  . TONSILLECTOMY      Prior to Admission medications   Medication Sig Start Date End Date Taking? Authorizing Provider  acetaminophen (TYLENOL) 500 MG tablet Take 500 mg by mouth every 6 (six) hours as needed.   Yes [provider]  apixaban (ELIQUIS) 5 MG TABS tablet 2 five mg tablets twice a day ( ) for a week, then 1 five mg tablet twice a day for three months 10/23/14  Yes Salley Hews, MD    Allergies Vicodin [hydrocodone-acetaminophen]  Family History  Problem Relation Age of Onset  . Diabetes Mother   . Diabetes Mellitus II Maternal Grandmother   . Rectal cancer Father     Social History Social History  Substance Use Topics  . Smoking status: Current Some Day Smoker    Packs/day: 1.00    Years: 15.00  . Smokeless tobacco: Never Used  . Alcohol use Yes     Comment: rare consumption  Review of Systems Constitutional: No fever/chills Eyes: No visual changes. ENT: No sore throat. Cardiovascular: Denies chest pain. Respiratory: Denies shortness of breath. Gastrointestinal: No abdominal pain.  No nausea, no vomiting.  No diarrhea.  No constipation. Genitourinary: Negative for dysuria. Musculoskeletal: acute onset pain, numbness, and coldness of the right foot about 14 hours prior to arrival in the emergency department Integumentary: Negative for rash. Neurological: Negative for headaches, focal weakness or numbness.   ____________________________________________   PHYSICAL EXAM:  VITAL SIGNS: ED Triage Vitals  Enc Vitals Group     BP 11/18/16 0842  118/61     Pulse Rate 11/18/16 0842 (!) 56     Resp 11/18/16 0842 18     Temp 11/18/16 0842 98.5 F (36.9 C)     Temp Source 11/18/16 0842 Oral     SpO2 11/18/16 0842 97 %     Weight 11/18/16 0827 127 kg (280 lb)     Height 11/18/16 0827 1.753 m ( )     Head Circumference --      Peak Flow --      Pain Score 11/18/16 0826 10     Pain Loc --      Pain Edu? --      Excl. in GC? --     Constitutional: Alert and oriented. Well appearing and in no acute distress. Eyes: Conjunctivae are normal.  Head: Atraumatic. Cardiovascular: Normal rate, regular rhythm. Good peripheral circulation except as documented below.  Grossly normal heart sounds. Respiratory: Normal respiratory effort.  No retractions.  Musculoskeletal: upon visual inspection the right foot is obviously a darker color than the left but more red  than blue.  the right foot has significantly delayed capillary refill and is tender to palpation. the patient reports decreased light touch sensation.  He is able to move it.  There is no palpable dorsalis pedis pulse.  The foot is cold to the touch.  The abnormalities stop at the ankle, and moving proximally from his ankle his 2 legs have an equivalent exam and are asymptomatic.  Left foot capillary refill is brisk and normal. Neurologic:  Normal speech and language. No gross focal neurologic deficits are appreciated.  Skin:  Skin is warm, dry and intact. No rash noted. Discoloration as noted above. Psychiatric: Mood and affect are normal. Speech and behavior are normal.  ____________________________________________   LABS (all labs ordered are listed, but only abnormal results are displayed)  Labs Reviewed  CBC WITH DIFFERENTIAL/PLATELET - Abnormal; Notable for the following:       Result Value   WBC 12.3 (*)    Neutro Abs 7.4 (*)    Basophils Absolute 0.2 (*)    All other components within normal limits  BASIC METABOLIC PANEL - Abnormal; Notable for the following:     Calcium 8.8 (*)    All other components within normal limits  PROTIME-INR  APTT  HEPARIN LEVEL (UNFRACTIONATED)   ____________________________________________  EKG  ED ECG REPORT I, Sayuri Rhames, the attending physician, personally viewed and interpreted this ECG.  Date: 11/18/2016 EKG Time: 11:40 AM Rate: 56 Rhythm: sinus bradycardia QRS Axis: normal Intervals: normal ST/T Wave abnormalities: normal Narrative Interpretation: no evidence of acute ischemia  ____________________________________________  RADIOLOGY   US Venous Img Lower Unilateral Right  Result Date: 11/18/2016 CLINICAL DATA:  Painful right foot, cold foot. EXAM: RIGHT LOWER EXTREMITY VENOUS DOPPLER ULTRASOUND TECHNIQUE: Gray-scale sonography with graded compression, as well as color Doppler and duplex ultrasound were performed to evaluate  the lower extremity deep venous systems from the level of the common femoral vein and including the common femoral, femoral, profunda femoral, popliteal and calf veins including the posterior tibial, peroneal and gastrocnemius veins when visible. The superficial great saphenous vein was also interrogated. Spectral Doppler was utilized to evaluate flow at rest and with distal augmentation maneuvers in the common femoral, femoral and popliteal veins. COMPARISON:  None. FINDINGS: Contralateral Common Femoral Vein: Respiratory phasicity is normal and symmetric with the symptomatic side. No evidence of thrombus. Normal compressibility. Common Femoral Vein: No evidence of thrombus. Normal compressibility, respiratory phasicity and response to augmentation. Saphenofemoral Junction: No evidence of thrombus. Normal compressibility and flow on color Doppler imaging. Profunda Femoral Vein: No evidence of thrombus. Normal compressibility and flow on color Doppler imaging. Femoral Vein: No evidence of thrombus. Normal compressibility, respiratory phasicity and response to augmentation. Popliteal Vein:  No evidence of thrombus. Normal compressibility, respiratory phasicity and response to augmentation. Calf Veins: No evidence of thrombus. Normal compressibility and flow on color Doppler imaging. Superficial Great Saphenous Vein: No evidence of thrombus. Normal compressibility and flow on color Doppler imaging. Venous Reflux:  None. Other Findings:  None. IMPRESSION: No evidence of DVT within the right lower extremity. Electronically Signed   By: Charlett Nose M.D.   On: 11/18/2016 10:38    ____________________________________________   PROCEDURES  Critical Care performed: Yes, see critical care procedure note(s)   Procedure(s) performed:   .Critical Care Performed by: Loleta Rose Authorized by: Loleta Rose   Critical care provider statement:    Critical care time (minutes):  45   Critical care time was exclusive of:  Separately billable procedures and treating other patients   Critical care was necessary to treat or prevent imminent or life-threatening deterioration of the following conditions:  Circulatory failure   Critical care was time spent personally by me on the following activities:  Development of treatment plan with patient or surrogate, discussions with consultants, evaluation of patient's response to treatment, examination of patient, obtaining history from patient or surrogate, ordering and performing treatments and interventions, ordering and review of laboratory studies, ordering and review of radiographic studies, pulse oximetry, re-evaluation of patient's condition and review of old charts      ____________________________________________   INITIAL IMPRESSION / ASSESSMENT AND PLAN / ED COURSE  Pertinent labs & imaging results that were available during my care of the patient were reviewed by me and considered in my medical decision making (see chart for details).   Clinical Course as of Nov 18 1148  Fri Nov 18, 2016  0912 I evaluated the patient almost  immediately after he was placed in the exam room.  His nurse and I both palpated for peripheral pulses in the right foot and could not find one.  We then used the Doppler and although I could easily identify the dorsalis pedis on the left foot, neither his nurse nor I could Doppler the right foot.  Given that he has a painful, discolored, cold, numb, and pulseless foot, with a history of  bilateral DVTs and has not been on anticoagulation for about a year, I immediately ordered heparin bolus and infusion according to protocol and UpToDate recommendations (18 units/kg/hour infusion rate). I then called to speak with Dr. Gilda Crease the vascular surgeon.  He was in the operating room but I relayed the message through his nurse that I have a 31 year old with a history of DVT with a cold painful pulseless foot and explained my current plan.  I asked if he would prefer that I obtain a venous ultrasound or a CTA.  He recommended stat ultrasound first and if negative proceed with CTA.  I also asked that he come to the emergency department as soon as possible after his surgery to evaluate the patient as well.  I have updated the patient.  It is important to note that the patient began having symptoms at least 14 hours prior to arrival in the emergency department.  [CF]  [phone] I spoke with the ultrasound technologist in person and let her know that this patient has priority and needs to go for his ultrasound next as soon as he gets started on heparin.  The nurse is establishing IV now.  The technologist passed along the word to her colleague so that he could be taken as soon as possible.  [CF]  1027 Patient back from ultrasound, awaiting results.    [CF]  1056 Ultrasound does not show DVT.  Ordering CT angio RLE.  Paged Dr. Gilda CreaseSchnier to give him an update.  [CF]  1104 Dr. Gilda CreaseSchnier just came out of the patient's room after having evaluated him in person.  We discussed the case in person.  He agreed with the plan for the CTA and  he expects to most likely take the patient to the angio suite for the patient gets the CTA and after he finishes one other case.  The patient knows the plan.  He is going at this time to the CT scanner.  [CF]  1144 Received phone call from radiologist regarding CTA.  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  I will update Dr. Gilda CreaseSchnier with these results.  [CF]  1149 Report is being called to send the patient to the OR with Dr. Gilda CreaseSchnier.  [CF]    Clinical Course User Index [CF] Loleta RoseForbach, Braidyn Scorsone, MD    ____________________________________________  FINAL CLINICAL IMPRESSION(S) / ED DIAGNOSES  Final diagnoses:  Arterial occlusion     MEDICATIONS GIVEN DURING THIS VISIT:  Medications  heparin ADULT infusion 100 units/mL (25000 units/22350mL sodium chloride 0.45%) (1,500 Units/hr Intravenous New Bag/Given 11/18/16 0920)  ceFAZolin (ANCEF) IVPB 2g/100 mL premix (not administered)  heparin bolus via infusion 6,000 Units (6,000 Units Intravenous Bolus from Bag 11/18/16 0916)  iopamidol (ISOVUE-370) 76 % injection 125 mL (125 mLs Intravenous Contrast Given 11/18/16 1109)     NEW OUTPATIENT MEDICATIONS STARTED DURING THIS VISIT:  New Prescriptions   No medications on file    Modified Medications   No medications on file    Discontinued Medications   IBUPROFEN (ADVIL,MOTRIN) 200 MG TABLET    Take 400 mg by mouth every 6 (six) hours as needed.     Note:  This document was prepared using Dragon voice recognition software and may include unintentional dictation errors.    Loleta RoseForbach, Kamarii Carton, MD 11/18/16 1150

## 2016-11-18 NOTE — ED Notes (Signed)
Patient Returned from CT.

## 2016-11-19 DIAGNOSIS — I70211 Atherosclerosis of native arteries of extremities with intermittent claudication, right leg: Secondary | ICD-10-CM

## 2016-11-19 LAB — CBC
HCT: 42.2 % (ref 40.0–52.0)
HEMOGLOBIN: 14.5 g/dL (ref 13.0–18.0)
MCH: 30.7 pg (ref 26.0–34.0)
MCHC: 34.4 g/dL (ref 32.0–36.0)
MCV: 89.3 fL (ref 80.0–100.0)
PLATELETS: 229 10*3/uL (ref 150–440)
RBC: 4.72 MIL/uL (ref 4.40–5.90)
RDW: 13.6 % (ref 11.5–14.5)
WBC: 11.9 10*3/uL — ABNORMAL HIGH (ref 3.8–10.6)

## 2016-11-19 LAB — HEPARIN LEVEL (UNFRACTIONATED): HEPARIN UNFRACTIONATED: 0.39 [IU]/mL (ref 0.30–0.70)

## 2016-11-19 MED ORDER — CLOPIDOGREL BISULFATE 75 MG PO TABS
300.0000 mg | ORAL_TABLET | ORAL | Status: AC
  Administered 2016-11-19: 300 mg via ORAL
  Filled 2016-11-19: qty 4

## 2016-11-19 MED ORDER — ASPIRIN EC 325 MG PO TBEC: 325.0000 mg | DELAYED_RELEASE_TABLET | Freq: Every day | ORAL | 1 refills | Status: AC

## 2016-11-19 MED ORDER — CLOPIDOGREL BISULFATE 75 MG PO TABS: 75.0000 mg | ORAL_TABLET | Freq: Every day | ORAL | 11 refills | Status: DC

## 2016-11-19 MED ORDER — ATORVASTATIN CALCIUM 20 MG PO TABS
20.0000 mg | ORAL_TABLET | Freq: Every day | ORAL | 6 refills | Status: DC
Start: 2016-11-19 — End: 2017-08-25

## 2016-11-19 NOTE — Progress Notes (Signed)
Patient discharge teaching given, including activity, diet, follow-up appoints, and medications. Patient verbalized understanding of all discharge instructions. IV access was d/c'd. Vitals are stable. Skin is intact except as charted in most recent assessments. Pt refused to be escorted out, to be driven home by family.  Jimmi Sidener  

## 2016-11-19 NOTE — Discharge Summary (Signed)
[facility]    Discharge Summary    Patient ID:  Thomas EnsignJonathan B Hopkins MRN: [id number] DOB/AGE: [birth year] 31 y.o.  Admit date: 11/18/2016 Discharge date: 11/19/2016 Date of Surgery: 11/18/2016 Surgeon: Surgeon(s): Channah Godeaux, Latina CraverGregory G, MD  Admission Diagnosis: Arterial occlusion [I70.90] Ischemic foot pain at rest [facility](HCC) [I73.9]  Discharge Diagnoses:  Arterial occlusion [I70.90] Ischemic foot pain at rest [facility](HCC) [I73.9]  Secondary Diagnoses: Past Medical History:  Diagnosis Date  . DVT of lower extremity, bilateral (HCC)    stopped Eliquis in 2017    Procedure(s): ABDOMINAL AORTOGRAM W/LOWER EXTREMITY  Discharged Condition: good  HPI:  The patient presented to the hospital emergency room with an ischemic right foot.  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  He was subsequently taken to special procedures where percutaneous thromboembolectomy was performed which was successful. He was maintained on a heparin drip overnight. Post procedure day #1 he was switched Plavix and aspirin and discharged home.  Hospital Course:  Thomas EnsignJonathan B Woodmansee is a 31 y.o. male is S/P Right Procedure(s): ABDOMINAL AORTOGRAM W/LOWER EXTREMITY Extubated: POD # 0 Physical exam: right foot is pink and warm trace palpable DP pulse Post-op wounds clean, dry, intact or healing well Pt. Ambulating, voiding and taking PO diet without difficulty. Pt pain controlled with PO pain meds. Labs as below Complications:none  Consults:    Significant Diagnostic Studies: CBC Lab Results  Component Value Date   WBC 11.9 (H) 11/19/2016   HGB 14.5 11/19/2016   HCT 42.2 11/19/2016   MCV 89.3 11/19/2016   PLT 229 11/19/2016    BMET    Component Value Date/Time   NA 139 11/18/2016 0914   K 3.8 11/18/2016 0914   CL 109 11/18/2016 0914   CO2 23 11/18/2016 0914   GLUCOSE 91 11/18/2016 0914   BUN 9 11/18/2016 0914   CREATININE 0.68 11/18/2016 0914   CALCIUM 8.8 (L) 11/18/2016 0914   GFRNONAA >60 11/18/2016 0914   GFRAA >60 11/18/2016 0914   COAG Lab Results  Component Value Date   INR 1.05 11/18/2016   INR 1.23 10/23/2014     Disposition:  Discharge to :Home Discharge Instructions    Call MD for:  redness, tenderness, or signs of infection (pain, swelling, bleeding, redness, odor or green/yellow discharge around incision site)    Complete by:  As directed    Call MD for:  severe or increased pain, loss or decreased feeling  in affected limb(s)    Complete by:  As directed    Call MD for:  temperature >100.5    Complete by:  As directed    Driving Restrictions    Complete by:  As directed    No driving for 2-3 days   Lifting restrictions    Complete by:  As directed    No lifting for 1 week   Resume previous diet    Complete by:  As directed      Allergies as of 11/19/2016      Reactions   Vicodin [hydrocodone-acetaminophen] Nausea And Vomiting   Headache      Medication List    STOP taking these medications   apixaban 5 MG Tabs tablet Commonly known as:  ELIQUIS     TAKE these medications  acetaminophen 500 MG tablet Commonly known as:  TYLENOL Take 500 mg by mouth every 6 (six) hours as needed.   aspirin EC 325 MG tablet Take 1 tablet (325 mg total) by mouth daily.   atorvastatin 20 MG tablet Commonly known as:  LIPITOR Take 1 tablet (20 mg total) by mouth daily at 6 PM.   clopidogrel 75 MG tablet Commonly known as:  PLAVIX Take 1 tablet (75 mg total) by mouth daily.            Discharge Care Instructions        Start     Ordered   11/20/16 0000  clopidogrel (PLAVIX) 75 MG tablet  Daily     11/19/16 1245   11/19/16 0000  atorvastatin (LIPITOR) 20 MG tablet  Daily-1800      11/19/16 1245   11/19/16 0000  Resume previous diet     11/19/16 1245   11/19/16 0000  Driving Restrictions    Comments:  No driving for 2-3 days   16/10/96 1245   11/19/16 0000  Lifting restrictions    Comments:  No lifting for 1 week   11/19/16 1245   11/19/16 0000  Call MD for:  temperature >100.5     11/19/16 1245   11/19/16 0000  Call MD for:  redness, tenderness, or signs of infection (pain, swelling, bleeding, redness, odor or green/yellow discharge around incision site)     11/19/16 1245   11/19/16 0000  Call MD for:  severe or increased pain, loss or decreased feeling  in affected limb(s)     11/19/16 1245   11/19/16 0000  aspirin EC 325 MG tablet  Daily     11/19/16 1245     Verbal and written Discharge instructions given to the patient. Wound care per Discharge AVS Follow-up Information    Darell Saputo, Latina Craver, MD Follow up.   Specialties:  Vascular Surgery, Cardiology, Radiology, Vascular Surgery Contact information: [address] [place] [address] [phone]           Signed: Levora Dredge, MD  11/19/2016, 3:02 PM

## 2016-11-19 NOTE — Progress Notes (Signed)
Verbal order from on call surgeon to take off PAD dressing. RN took off PAD dressing to left groin, minimal amount of bruising noted. Pt educated on the importance of assessing the site frequently for bruising or bleeding. Will continue to monitor pt.   Thomas Hopkins [place]

## 2016-11-19 NOTE — Care Management Note (Signed)
Case Management Note  Patient Details  Name: Lanier EnsignJonathan B Klindt MRN: [id number] Date of Birth: April 26, 1985  Subjective/Objective:     Mr Jacqualyn PoseyHelbert goes to [facility] for healthcare needs. He is currently uninsured and was provided with discount coupons for Plavix and Lipitor. Mr Jacqualyn PoseyHelbert requested information about assistance paying  His hospital bill. He was advised to call the Business Office on Monday morning to discuss his payment situation.               Action/Plan:   Expected Discharge Date:  11/19/16               Expected Discharge Plan:     In-House Referral:  NA  Discharge planning Services  NA  Post Acute Care Choice:  NA Choice offered to:  NA  DME Arranged:    DME Agency:     HH Arranged:  NA HH Agency:  NA  Status of Service:   (Provided with discount coupons for Plavix and Lipitor.  Goes to [facility] for medical treatment. )  If discussed at [employer] Length of Stay Meetings, dates discussed:    Additional Comments:  Avonne Berkery A, RN 11/19/2016, 1:46 PM

## 2016-11-21 ENCOUNTER — Encounter: Payer: Self-pay | Admitting: Vascular Surgery

## 2016-11-22 ENCOUNTER — Telehealth (INDEPENDENT_AMBULATORY_CARE_PROVIDER_SITE_OTHER): Payer: Self-pay | Admitting: Vascular Surgery

## 2016-11-22 NOTE — Telephone Encounter (Signed)
Sheron'S GRANDMOTHER MARY CALLED, Jaivon IS HAVING BAD PAINS IN HIS LEG, SHE IS WANTING TO KNOW IF [facility] WILL GIVE HIM SOME PAIN MEDICINE

## 2016-11-22 NOTE — Telephone Encounter (Signed)
I spoke KS about the patient asking for pain medicine because he is having some pain on the top of his foot;the patient just had a leg angio done on 11/18/16.Kim spoke with GS and he wanted the patient to have a abi done and we schedule him for 11/23/16 for the abi and KS wrote him a script for Oxycodone 5mg  1-2 po q6-8h prn

## 2016-11-23 ENCOUNTER — Other Ambulatory Visit (INDEPENDENT_AMBULATORY_CARE_PROVIDER_SITE_OTHER): Payer: Self-pay | Admitting: Vascular Surgery

## 2016-11-23 ENCOUNTER — Other Ambulatory Visit (INDEPENDENT_AMBULATORY_CARE_PROVIDER_SITE_OTHER): Payer: Self-pay

## 2016-11-23 ENCOUNTER — Encounter (INDEPENDENT_AMBULATORY_CARE_PROVIDER_SITE_OTHER): Payer: Self-pay

## 2016-11-23 DIAGNOSIS — M79606 Pain in leg, unspecified: Secondary | ICD-10-CM

## 2016-12-26 ENCOUNTER — Encounter (INDEPENDENT_AMBULATORY_CARE_PROVIDER_SITE_OTHER): Payer: Self-pay

## 2016-12-26 ENCOUNTER — Ambulatory Visit (INDEPENDENT_AMBULATORY_CARE_PROVIDER_SITE_OTHER): Payer: Self-pay | Admitting: Vascular Surgery

## 2017-01-23 ENCOUNTER — Encounter: Payer: Self-pay | Admitting: Emergency Medicine

## 2017-01-23 ENCOUNTER — Emergency Department
Admission: EM | Admit: 2017-01-23 | Discharge: 2017-01-23 | Disposition: A | Discharge status: Home / Self Care | Payer: Self-pay | Attending: Emergency Medicine | Admitting: Emergency Medicine

## 2017-01-23 ENCOUNTER — Emergency Department: Payer: Self-pay

## 2017-01-23 ENCOUNTER — Other Ambulatory Visit: Payer: Self-pay

## 2017-01-23 DIAGNOSIS — Z86718 Personal history of other venous thrombosis and embolism: Secondary | ICD-10-CM | POA: Insufficient documentation

## 2017-01-23 DIAGNOSIS — K118 Other diseases of salivary glands: Secondary | ICD-10-CM

## 2017-01-23 DIAGNOSIS — F17210 Nicotine dependence, cigarettes, uncomplicated: Secondary | ICD-10-CM | POA: Insufficient documentation

## 2017-01-23 DIAGNOSIS — R42 Dizziness and giddiness: Secondary | ICD-10-CM | POA: Insufficient documentation

## 2017-01-23 DIAGNOSIS — R51 Headache: Secondary | ICD-10-CM

## 2017-01-23 DIAGNOSIS — K119 Disease of salivary gland, unspecified: Secondary | ICD-10-CM | POA: Insufficient documentation

## 2017-01-23 DIAGNOSIS — R519 Headache, unspecified: Secondary | ICD-10-CM

## 2017-01-23 LAB — URINALYSIS, COMPLETE (UACMP) WITH MICROSCOPIC
BILIRUBIN URINE: NEGATIVE
Bacteria, UA: NONE SEEN
Glucose, UA: NEGATIVE mg/dL
Ketones, ur: NEGATIVE mg/dL
LEUKOCYTES UA: NEGATIVE
Nitrite: NEGATIVE
PH: 5 (ref 5.0–8.0)
Protein, ur: NEGATIVE mg/dL
SPECIFIC GRAVITY, URINE: 1.024 (ref 1.005–1.030)

## 2017-01-23 LAB — BASIC METABOLIC PANEL
ANION GAP: 9 (ref 5–15)
BUN: 12 mg/dL (ref 6–20)
CALCIUM: 8.8 mg/dL — AB (ref 8.9–10.3)
CO2: 23 mmol/L (ref 22–32)
CREATININE: 0.8 mg/dL (ref 0.61–1.24)
Chloride: 104 mmol/L (ref 101–111)
Glucose, Bld: 119 mg/dL — ABNORMAL HIGH (ref 65–99)
Potassium: 3.7 mmol/L (ref 3.5–5.1)
SODIUM: 136 mmol/L (ref 135–145)

## 2017-01-23 LAB — CBC
HCT: 46.1 % (ref 40.0–52.0)
HEMOGLOBIN: 15.2 g/dL (ref 13.0–18.0)
MCH: 29.6 pg (ref 26.0–34.0)
MCHC: 32.9 g/dL (ref 32.0–36.0)
MCV: 89.9 fL (ref 80.0–100.0)
PLATELETS: 260 10*3/uL (ref 150–440)
RBC: 5.13 MIL/uL (ref 4.40–5.90)
RDW: 13.2 % (ref 11.5–14.5)
WBC: 11.6 10*3/uL — AB (ref 3.8–10.6)

## 2017-01-23 MED ORDER — IOPAMIDOL (ISOVUE-370) INJECTION 76%
75.0000 mL | Freq: Once | INTRAVENOUS | Status: AC | PRN
Start: 2017-01-23 — End: 2017-01-23
  Administered 2017-01-23: 75 mL via INTRAVENOUS
  Filled 2017-01-23: qty 75

## 2017-01-23 MED ORDER — ACETAMINOPHEN 325 MG PO TABS
650.0000 mg | ORAL_TABLET | Freq: Once | ORAL | Status: AC
  Administered 2017-01-23: 650 mg via ORAL
  Filled 2017-01-23: qty 2

## 2017-01-23 MED ORDER — SODIUM CHLORIDE 0.9 % IV BOLUS (SEPSIS)
1000.0000 mL | Freq: Once | INTRAVENOUS | Status: AC
  Administered 2017-01-23: 1000 mL via INTRAVENOUS

## 2017-01-23 NOTE — ED Notes (Signed)
First Nurse: dizziness and head aches going on for a couple weeks.

## 2017-01-23 NOTE — ED Provider Notes (Signed)
[facility] Emergency Department Provider Note  ____________________________________________  Time seen: Approximately 2:44 PM  I have reviewed the triage vital signs and the nursing notes.   HISTORY  Chief Complaint Headache and Dizziness    HPI Thomas Hopkins is a 31 y.o. male with a history of migraines, history of abdominal aortic plaque rupture, DVT, on Plavix and full dose aspirin, presenting with intermittent headaches and dizziness.  The patient reports that for the past 2 weeks, he has had intermittent headaches that sometimes are frontal, posterior, or on the sides, every couple of days.  He does have a history of migraines, but these headaches are "not as bad" and much more frequent than his baseline migraines.  The headaches can last as long as several hours, and generally go away with sleep, or BC powders or Goody's Powder.  Unassociated to the headache, he is also had intermittent dizziness and lightheadedness without any blurred or double vision, numbness tingling or weakness, difficulty walking.  The dizziness and lightheadedness is worse if he sits down and better if he stands up.  He has been eating and drinking normally, not had any recent vomiting or diarrhea.  No recent illness including cough or cold symptoms, fever or chills.  No change in his medications.  No trauma.  This morning, the patient had a "mild" headache, with dizziness and lightheadedness but also associated nausea without vomiting, so he came to the emergency department for further evaluation.  Past Medical History:  Diagnosis Date  . DVT of lower extremity, bilateral (HCC)    stopped Eliquis in 2017    Patient Active Problem List   Diagnosis Date Noted  . Ischemic foot pain at rest [facility](HCC) 11/18/2016  . Acute venous embolism and thrombosis of deep vessels of distal lower extremity (HCC) 10/22/2014  . Deep venous thrombosis (HCC) 10/22/2014  . RLQ abdominal pain   . Abdominal  pain, acute, right lower quadrant 10/13/2014    Past Surgical History:  Procedure Laterality Date  . TONSILLECTOMY      Current Outpatient Rx  . Order #: [id number] Class: Historical Med  . Order #: [id number] Class: Print  . Order #: [id number] Class: Normal  . Order #: [id number] Class: Print    Allergies Vicodin [hydrocodone-acetaminophen]  Family History  Problem Relation Age of Onset  . Diabetes Mother   . Diabetes Mellitus II Maternal Grandmother   . Rectal cancer Father     Social History Social History   Tobacco Use  . Smoking status: Current Some Day Smoker    Packs/day: 1.00    Years: 15.00    Pack years: 15.00  . Smokeless tobacco: Never Used  Substance Use Topics  . Alcohol use: Yes    Comment: rare consumption  . Drug use: No    Review of Systems Constitutional: No fever/chills.  Positive lightheadedness and dizziness.  No syncope.  No diaphoresis.  No trauma. Eyes: No visual changes.  No blurred or double vision. ENT: No sore throat. No congestion or rhinorrhea.  No ear pain. Cardiovascular: Denies chest pain. Denies palpitations. Respiratory: Denies shortness of breath.  No cough. Gastrointestinal: No abdominal pain.  Positive nausea, no vomiting.  No diarrhea.  No constipation. Genitourinary: Negative for dysuria. Musculoskeletal: Negative for back pain. Skin: Negative for rash. Neurological: Positive for headaches. No focal numbness, tingling or weakness.  No difficulty walking.    ____________________________________________   PHYSICAL EXAM:  VITAL SIGNS: ED Triage Vitals  Enc Vitals Group     BP  01/23/17 1057 124/66     Pulse Rate 01/23/17 1057 60     Resp 01/23/17 1057 18     Temp 01/23/17 1057 98.2 F (36.8 C)     Temp Source 01/23/17 1057 Oral     SpO2 01/23/17 1057 96 %     Weight 01/23/17 1058 295 lb (133.8 kg)     Height 01/23/17 1058 5\' 9"  (1.753 m)     Head Circumference --      Peak Flow --      Pain Score 01/23/17 1103 2      Pain Loc --      Pain Edu? --      Excl. in GC? --     Constitutional: Alert and oriented. Well appearing and in no acute distress. Answers questions appropriately. Eyes: Conjunctivae are normal.  EOMI. PERRLA.  No vertical or horizontal nystagmus.  No scleral icterus. Head: Atraumatic. Nose: No congestion/rhinnorhea. Mouth/Throat: Mucous membranes are moist.  Poor dentition without any evidence of acute abscess.  No trismus. Neck: No stridor.  Supple.  No JVD.  No meningismus. Cardiovascular: Normal rate, regular rhythm. No murmurs, rubs or gallops.  Respiratory: Normal respiratory effort.  No accessory muscle use or retractions. Lungs CTAB.  No wheezes, rales or ronchi. Gastrointestinal: Obese.  Soft, nontender and nondistended.  No guarding or rebound.  No peritoneal signs. Musculoskeletal: No LE edema. Neurologic: Alert and oriented 3. Speech is clear.Face and smile symmetric. Tongue is midline.  EOMI.  PERRLA.  No vertical or horizontal nystagmus.  No pronator drift. 5 out of 5 grip, biceps, triceps, hip flexors, plantar flexion and dorsiflexion. Normal sensation to light touch in the bilateral upper and lower extremities, and face.  Normal gait without ataxia. Skin:  Skin is warm, dry and intact. No rash noted. Psychiatric: Mood and affect are normal. Speech and behavior are normal.  Normal judgement.  ____________________________________________   LABS (all labs ordered are listed, but only abnormal results are displayed)  Labs Reviewed  BASIC METABOLIC PANEL - Abnormal; Notable for the following components:      Result Value   Glucose, Bld 119 (*)    Calcium 8.8 (*)    All other components within normal limits  CBC - Abnormal; Notable for the following components:   WBC 11.6 (*)    All other components within normal limits  URINALYSIS, COMPLETE (UACMP) WITH MICROSCOPIC - Abnormal; Notable for the following components:   Color, Urine YELLOW (*)    APPearance CLEAR (*)     Hgb urine dipstick SMALL (*)    Squamous Epithelial / LPF 0-5 (*)    All other components within normal limits   ____________________________________________  EKG  ED ECG REPORT I, Rockne Menghini, the attending physician, personally viewed and interpreted this ECG.   Date: 01/23/2017  EKG Time: 1108  Rate: 54  Rhythm: sinus bradycardia  Axis: normal  Intervals:none  ST&T Change: No STEMI  ____________________________________________  RADIOLOGY  Ct Angio Head W Or Wo Contrast  Result Date: 01/23/2017 CLINICAL DATA:  Intermittent headaches and dizziness. Nausea. Presyncopal sensation. Right-sided headache. EXAM: CT ANGIOGRAPHY HEAD AND NECK TECHNIQUE: Multidetector CT imaging of the head and neck was performed using the standard protocol during bolus administration of intravenous contrast. Multiplanar CT image reconstructions and MIPs were obtained to evaluate the vascular anatomy. Carotid stenosis measurements (when applicable) are obtained utilizing NASCET criteria, using the distal internal carotid diameter as the denominator. CONTRAST:  75mL ISOVUE-370 IOPAMIDOL (ISOVUE-370) INJECTION 76% COMPARISON:  None. FINDINGS: CT HEAD FINDINGS Brain: No acute infarct, hemorrhage, or mass lesion is present. No significant extraaxial fluid collection is present. The ventricles are of normal size. Vascular: No hyperdense vessel or unexpected calcification. Skull: Calvarium is intact. No focal lytic or blastic lesions are present. Sinuses: The paranasal sinuses and mastoid air cells are clear. Orbits: Globes and orbits are within normal limits. Review of the MIP images confirms the above findings CTA NECK FINDINGS Aortic arch: A 3 vessel arch configuration is present. No significant vascular calcification or stenosis is present. Right carotid system: The right common carotid artery is within normal limits. Bifurcation is normal. There is moderate tortuosity in the cervical right ICA without  significant stenosis. Left carotid system: A left common carotid artery is within normal limits. The left carotid bifurcation is normal. There is moderate tortuosity of the cervical left ICA without focal stenosis. Vertebral arteries: The vertebral arteries originate from the subclavian artery is bilaterally without focal stenosis. The vertebral arteries are codominant. There is no focal stenosis of either vertebral artery in the neck. Skeleton: Vertebral body heights and alignment are normal. No focal lytic or blastic lesions are present. Other neck: A 10 mm ovoid density is present within the superficial segment of the right parotid gland. Other periparotid lymph nodes are present bilaterally. There is no significant cervical adenopathy. The submandibular glands are normal bilaterally. No focal mucosal or submucosal lesions are present. Upper chest: The lung apices are clear. The superior mediastinum is within normal limits. Review of the MIP images confirms the above findings CTA HEAD FINDINGS Anterior circulation: The internal carotid artery is are within normal limits from the skullbase through the ICA termini bilaterally. The A1 and M1 segments are normal. The anterior communicating artery is patent. MCA bifurcations are intact bilaterally. The ACA and MCA branch vessels are within normal limits. Posterior circulation: The vertebral arteries are codominant. PICA origins are visualized and normal. The vertebrobasilar junction is normal. The basilar artery is normal. Both posterior cerebral artery is originate from the basilar tip. PCA branch vessels are within normal limits. Venous sinuses: The dural sinuses are patent. The left transverse sinus is dominant. The straight sinus and deep cerebral veins are intact. Cortical veins are unremarkable. Anatomic variants: None Delayed phase: The postcontrast images demonstrate no pathologic enhancement. Review of the MIP images confirms the above findings IMPRESSION: 1.  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 This can be seen in the setting of chronic hypertension. 2. No other focal vascular abnormality. 3. 10 mm homogeneously dense ovoid lesion within the right parotid gland likely represents an intraparotid lymph node. Recommend follow-up CT of the neck with contrast at 06-12 months to assure stability. **An incidental finding of potential clinical significance has been found. Right parotid density** Electronically Signed   By: Marin Roberts M.D.   On: 01/23/2017 15:41   Ct Angio Neck W And/or Wo Contrast  Result Date: 01/23/2017 CLINICAL DATA:  Intermittent headaches and dizziness. Nausea. Presyncopal sensation. Right-sided headache. EXAM: CT ANGIOGRAPHY HEAD AND NECK TECHNIQUE: Multidetector CT imaging of the head and neck was performed using the standard protocol during bolus administration of intravenous contrast. Multiplanar CT image reconstructions and MIPs were obtained to evaluate the vascular anatomy. Carotid stenosis measurements (when applicable) are obtained utilizing NASCET criteria, using the distal internal carotid diameter as the denominator. CONTRAST:  75mL ISOVUE-370 IOPAMIDOL (ISOVUE-370) INJECTION 76% COMPARISON:  None. FINDINGS: CT HEAD FINDINGS Brain: No acute infarct, hemorrhage,  or mass lesion is present. No significant extraaxial fluid collection is present. The ventricles are of normal size. Vascular: No hyperdense vessel or unexpected calcification. Skull: Calvarium is intact. No focal lytic or blastic lesions are present. Sinuses: The paranasal sinuses and mastoid air cells are clear. Orbits: Globes and orbits are within normal limits. Review of the MIP images confirms the above findings CTA NECK FINDINGS Aortic arch: A 3 vessel arch configuration is present. No significant vascular calcification or stenosis is present. Right carotid system: The right common carotid artery is  within normal limits. Bifurcation is normal. There is moderate tortuosity in the cervical right ICA without significant stenosis. Left carotid system: A left common carotid artery is within normal limits. The left carotid bifurcation is normal. There is moderate tortuosity of the cervical left ICA without focal stenosis. Vertebral arteries: The vertebral arteries originate from the subclavian artery is bilaterally without focal stenosis. The vertebral arteries are codominant. There is no focal stenosis of either vertebral artery in the neck. Skeleton: Vertebral body heights and alignment are normal. No focal lytic or blastic lesions are present. Other neck: A 10 mm ovoid density is present within the superficial segment of the right parotid gland. Other periparotid lymph nodes are present bilaterally. There is no significant cervical adenopathy. The submandibular glands are normal bilaterally. No focal mucosal or submucosal lesions are present. Upper chest: The lung apices are clear. The superior mediastinum is within normal limits. Review of the MIP images confirms the above findings CTA HEAD FINDINGS Anterior circulation: The internal carotid artery is are within normal limits from the skullbase through the ICA termini bilaterally. The A1 and M1 segments are normal. The anterior communicating artery is patent. MCA bifurcations are intact bilaterally. The ACA and MCA branch vessels are within normal limits. Posterior circulation: The vertebral arteries are codominant. PICA origins are visualized and normal. The vertebrobasilar junction is normal. The basilar artery is normal. Both posterior cerebral artery is originate from the basilar tip. PCA branch vessels are within normal limits. Venous sinuses: The dural sinuses are patent. The left transverse sinus is dominant. The straight sinus and deep cerebral veins are intact. Cortical veins are unremarkable. Anatomic variants: None Delayed phase: The postcontrast  images demonstrate no pathologic enhancement. Review of the MIP images confirms the above findings IMPRESSION: 1. 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 This can be seen in the setting of chronic hypertension. 2. No other focal vascular abnormality. 3. 10 mm homogeneously dense ovoid lesion within the right parotid gland likely represents an intraparotid lymph node. Recommend follow-up CT of the neck with contrast at 06-12 months to assure stability. **An incidental finding of potential clinical significance has been found. Right parotid density** Electronically Signed   By: Marin Robertshristopher  Mattern M.D.   On: 01/23/2017 15:41    ____________________________________________   PROCEDURES  Procedure(s) performed: None  Procedures  Critical Care performed: No ____________________________________________   INITIAL IMPRESSION / ASSESSMENT AND PLAN / ED COURSE  Pertinent labs & imaging results that were available during my care of the patient were reviewed by me and considered in my medical decision making (see chart for details).  31 y.o. male with a history of migraines, DVT and aortic plaque rupture on Plavix and aspirin, presenting for intermittent headaches with lightheadedness and dizziness, today with nausea.  Overall, the patient is hemodynamically stable and afebrile.  There is no evidence of meningismus.  It is unlikely he has a subarachnoid hemorrhage  or acute intracranial bleeding, but given his clot history, will get a CT and CT angiogram to evaluate for bleeding, clot or posterior stroke.  These headaches are not indicative of pathology typical for migraines.  We will also check his electrolytes.  Plan reevaluation for final disposition.  ----------------------------------------- 3:06 PM on 01/23/2017 -----------------------------------------  Patient's laboratory studies are reassuring.  His electrolytes are within  normal limits, his blood counts are normal, and his urinalysis does not show a UTI.  ----------------------------------------- 4:24 PM on 01/23/2017 -----------------------------------------  The patient has undergone CT and CT angiogram which did not show any causes for his headache, lightheadedness or dizziness.  He does have a tortuous internal carotid without any thrombus.  He also has a right parotid mass, which I have told him about, so that he may undergo repeat imaging in 6-12 months with his primary care physician.  The patient is not orthostatic, and continues to be hemodynamically stable and neurologically intact.  At this time, the patient is safe for discharge home, and will follow up with his primary care physician.  Return precautions were discussed with the patient and his mother.  ____________________________________________  FINAL CLINICAL IMPRESSION(S) / ED DIAGNOSES  Final diagnoses:  Parotid mass  Acute nonintractable headache, unspecified headache type  Dizziness  Lightheadedness         NEW MEDICATIONS STARTED DURING THIS VISIT:  This SmartLink is deprecated. Use AVSMEDLIST instead to display the medication list for a patient.    Rockne Menghini, MD 01/23/17 [phone]

## 2017-01-23 NOTE — ED Triage Notes (Signed)
Headaches and dizziness on and off last week.  Has had nausea.  Dizziness comes on when just sitting.  Will feel like going to pass out.  Hx DVT and blockage in abdominal artery.  No photophobia.  Headache today is right side.

## 2017-01-23 NOTE — Discharge Instructions (Signed)
Please drink plenty of fluids stay well-hydrated, and eat small regular healthy meals throughout the day.  Get plenty of rest.  Today your CT scan did show a mass in your right parotid gland, which is a saliva gland by her mouth.  This will need repeat imaging in 6-12 months, which your regular doctor will need to order for you.  Return to the emergency department if you develop severe pain, changes in vision or speech, changes in mental status, numbness tingling or weakness, difficulty walking, or any other symptoms concerning to you.

## 2017-01-25 ENCOUNTER — Other Ambulatory Visit (INDEPENDENT_AMBULATORY_CARE_PROVIDER_SITE_OTHER): Payer: Self-pay | Admitting: Vascular Surgery

## 2017-01-25 DIAGNOSIS — I739 Peripheral vascular disease, unspecified: Secondary | ICD-10-CM

## 2017-01-26 ENCOUNTER — Ambulatory Visit (INDEPENDENT_AMBULATORY_CARE_PROVIDER_SITE_OTHER): Payer: Self-pay

## 2017-01-26 ENCOUNTER — Ambulatory Visit (INDEPENDENT_AMBULATORY_CARE_PROVIDER_SITE_OTHER): Payer: Self-pay | Admitting: Vascular Surgery

## 2017-01-26 ENCOUNTER — Encounter (INDEPENDENT_AMBULATORY_CARE_PROVIDER_SITE_OTHER): Payer: Self-pay

## 2017-01-26 ENCOUNTER — Encounter (INDEPENDENT_AMBULATORY_CARE_PROVIDER_SITE_OTHER): Payer: Self-pay | Admitting: Vascular Surgery

## 2017-01-26 VITALS — BP 117/74 | HR 53 | Resp 14 | Ht 69.0 in | Wt 297.0 lb

## 2017-01-26 DIAGNOSIS — I739 Peripheral vascular disease, unspecified: Secondary | ICD-10-CM

## 2017-01-26 DIAGNOSIS — I824Z1 Acute embolism and thrombosis of unspecified deep veins of right distal lower extremity: Secondary | ICD-10-CM

## 2017-01-26 DIAGNOSIS — I700 Atherosclerosis of aorta: Secondary | ICD-10-CM

## 2017-01-27 ENCOUNTER — Encounter (INDEPENDENT_AMBULATORY_CARE_PROVIDER_SITE_OTHER): Payer: Self-pay | Admitting: Vascular Surgery

## 2017-01-27 DIAGNOSIS — I700 Atherosclerosis of aorta: Secondary | ICD-10-CM | POA: Insufficient documentation

## 2017-01-27 NOTE — Progress Notes (Signed)
MRN : [id number]  Thomas EnsignJonathan B Gordy is a 31 y.o. (27-Nov-1985) male who presents with chief complaint of  Chief Complaint  Patient presents with  . Follow-up    2wk f/u ABI  .  History of Present Illness: The patient returns to the office for followup and review of the noninvasive studies.  There is been no further episodes of embolization.  There have been no interval changes in lower extremity symptoms. No interval shortening of the patient's claudication distance or development of rest pain symptoms. No new ulcers or wounds have occurred since the last visit.  There have been no significant changes to the patient's overall health care.  The patient denies amaurosis fugax or recent TIA symptoms. There are no recent neurological changes noted. The patient denies history of DVT, PE or superficial thrombophlebitis. The patient denies recent episodes of angina or shortness of breath.   ABI are normal bilaterally Current Meds  Medication Sig  . acetaminophen (TYLENOL) 500 MG tablet Take 500 mg by mouth every 6 (six) hours as needed.  Marland Kitchen. aspirin EC 325 MG tablet Take 1 tablet (325 mg total) by mouth daily.  Marland Kitchen. atorvastatin (LIPITOR) 20 MG tablet Take 1 tablet (20 mg total) by mouth daily at 6 PM.  . clopidogrel (PLAVIX) 75 MG tablet Take 1 tablet (75 mg total) by mouth daily.    Past Medical History:  Diagnosis Date  . DVT of lower extremity, bilateral (HCC)    stopped Eliquis in 2017    Past Surgical History:  Procedure Laterality Date  . ABDOMINAL AORTOGRAM W/LOWER EXTREMITY Right 11/18/2016   Performed by Renford DillsSchnier, Gregory G[facility]  . APPENDECTOMY LAPAROSCOPIC N/A 10/14/2014   Performed by Lattie Hawooper, Richard E, [facility]  . TONSILLECTOMY      Social History Social History   Tobacco Use  . Smoking status: Current Some Day Smoker    Packs/day: 1.00    Years: 15.00    Pack years: 15.00  . Smokeless tobacco: Never Used  Substance Use Topics  . Alcohol  use: Yes    Comment: rare consumption  . Drug use: No    Family History Family History  Problem Relation Age of Onset  . Diabetes Mother   . Diabetes Mellitus II Maternal Grandmother   . Rectal cancer Father     Allergies  Allergen Reactions  . Vicodin [Hydrocodone-Acetaminophen] Nausea And Vomiting    Headache     REVIEW OF SYSTEMS (Negative unless checked)  Constitutional: [] Weight loss  [] Fever  [] Chills Cardiac: [] Chest pain   [] Chest pressure   [] Palpitations   [] Shortness of breath when laying flat   [] Shortness of breath with exertion. Vascular:  [] Pain in legs with walking   [] Pain in legs at rest  [] History of DVT   [] Phlebitis   [] Swelling in legs   [] Varicose veins   [] Non-healing ulcers Pulmonary:   [] Uses home oxygen   [] Productive cough   [] Hemoptysis   [] Wheeze  [] COPD   [] Asthma Neurologic:  [] Dizziness   [] Seizures   [] History of stroke   [] History of TIA  [] Aphasia   [] Vissual changes   [] Weakness or numbness in arm   [] Weakness or numbness in leg Musculoskeletal:   [] Joint swelling   [] Joint pain   [] Low back pain Hematologic:  [] Easy bruising  [] Easy bleeding   [] Hypercoagulable state   [] Anemic Gastrointestinal:  [] Diarrhea   [] Vomiting  [] Gastroesophageal reflux/heartburn   [] Difficulty swallowing. Genitourinary:  []   Chronic kidney disease   [] Difficult urination  [] Frequent urination   [] Blood in urine Skin:  [] Rashes   [] Ulcers  Psychological:  [] History of anxiety   []  History of major depression.  Physical Examination  Vitals:   01/26/17 0821  BP: 117/74  Pulse: (!) 53  Resp: 14  Weight: 134.7 kg (297 lb)  Height: 5\' 9"  (1.753 m)   Body mass index is 43.86 kg/m. Gen: WD/WN, NAD Head: [place]/AT, No temporalis wasting.  Ear/Nose/Throat: Hearing grossly intact, nares w/o erythema or drainage, poor dentition Eyes: PER, EOMI, sclera nonicteric.  Neck: Supple, no masses.  No bruit or JVD.  Pulmonary:  Good air movement, clear to auscultation  bilaterally, no use of accessory muscles.  Cardiac: RRR, normal S1, S2, no Murmurs. Vascular:  Vessel Right Left  Radial Palpable Palpable  Ulnar Palpable Palpable  Brachial Palpable Palpable  Carotid Palpable Palpable  Femoral Palpable Palpable  Popliteal Palpable Palpable  PT Palpable Palpable  DP Palpable Palpable   Gastrointestinal: soft, non-distended. No guarding/no peritoneal signs.  Musculoskeletal: M/S 5/5 throughout.  No deformity or atrophy.  Neurologic: CN 2-12 intact. Pain and light touch intact in extremities.  Symmetrical.  Speech is fluent. Motor exam as listed above. Psychiatric: Judgment intact, Mood & affect appropriate for pt's clinical situation. Dermatologic: No rashes or ulcers noted.  No changes consistent with cellulitis. Lymph : No Cervical lymphadenopathy, no lichenification or skin changes of chronic lymphedema.  CBC Lab Results  Component Value Date   WBC 11.6 (H) 01/23/2017   HGB 15.2 01/23/2017   HCT 46.1 01/23/2017   MCV 89.9 01/23/2017   PLT 260 01/23/2017    BMET    Component Value Date/Time   NA 136 01/23/2017 1103   K 3.7 01/23/2017 1103   CL 104 01/23/2017 1103   CO2 23 01/23/2017 1103   GLUCOSE 119 (H) 01/23/2017 1103   BUN 12 01/23/2017 1103   CREATININE 0.80 01/23/2017 1103   CALCIUM 8.8 (L) 01/23/2017 1103   GFRNONAA >60 01/23/2017 1103   GFRAA >60 01/23/2017 1103   Estimated Creatinine Clearance: 182.2 mL/min (by C-G formula based on SCr of 0.8 mg/dL).  COAG Lab Results  Component Value Date   INR 1.05 11/18/2016   INR 1.23 10/23/2014    Radiology Ct Angio Head W Or Wo Contrast  Result Date: 01/23/2017 CLINICAL DATA:  Intermittent headaches and dizziness. Nausea. Presyncopal sensation. Right-sided headache. EXAM: CT ANGIOGRAPHY HEAD AND NECK TECHNIQUE: Multidetector CT imaging of the head and neck was performed using the standard protocol during bolus administration of intravenous contrast. Multiplanar CT image  reconstructions and MIPs were obtained to evaluate the vascular anatomy. Carotid stenosis measurements (when applicable) are obtained utilizing NASCET criteria, using the distal internal carotid diameter as the denominator. CONTRAST:  75mL ISOVUE-370 IOPAMIDOL (ISOVUE-370) INJECTION 76% COMPARISON:  None. FINDINGS: CT HEAD FINDINGS Brain: No acute infarct, hemorrhage, or mass lesion is present. No significant extraaxial fluid collection is present. The ventricles are of normal size. Vascular: No hyperdense vessel or unexpected calcification. Skull: Calvarium is intact. No focal lytic or blastic lesions are present. Sinuses: The paranasal sinuses and mastoid air cells are clear. Orbits: Globes and orbits are within normal limits. Review of the MIP images confirms the above findings CTA NECK FINDINGS Aortic arch: A 3 vessel arch configuration is present. No significant vascular calcification or stenosis is present. Right carotid system: The right common carotid artery is within normal limits. Bifurcation is normal. There is moderate tortuosity in the cervical  right ICA without significant stenosis. Left carotid system: A left common carotid artery is within normal limits. The left carotid bifurcation is normal. There is moderate tortuosity of the cervical left ICA without focal stenosis. Vertebral arteries: The vertebral arteries originate from the subclavian artery is bilaterally without focal stenosis. The vertebral arteries are codominant. There is no focal stenosis of either vertebral artery in the neck. Skeleton: Vertebral body heights and alignment are normal. No focal lytic or blastic lesions are present. Other neck: A 10 mm ovoid density is present within the superficial segment of the right parotid gland. Other periparotid lymph nodes are present bilaterally. There is no significant cervical adenopathy. The submandibular glands are normal bilaterally. No focal mucosal or submucosal lesions are present. Upper  chest: The lung apices are clear. The superior mediastinum is within normal limits. Review of the MIP images confirms the above findings CTA HEAD FINDINGS Anterior circulation: The internal carotid artery is are within normal limits from the skullbase through the ICA termini bilaterally. The A1 and M1 segments are normal. The anterior communicating artery is patent. MCA bifurcations are intact bilaterally. The ACA and MCA branch vessels are within normal limits. Posterior circulation: The vertebral arteries are codominant. PICA origins are visualized and normal. The vertebrobasilar junction is normal. The basilar artery is normal. Both posterior cerebral artery is originate from the basilar tip. PCA branch vessels are within normal limits. Venous sinuses: The dural sinuses are patent. The left transverse sinus is dominant. The straight sinus and deep cerebral veins are intact. Cortical veins are unremarkable. Anatomic variants: None Delayed phase: The postcontrast images demonstrate no pathologic enhancement. Review of the MIP images confirms the above findings IMPRESSION: 1. 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 This can be seen in the setting of chronic hypertension. 2. No other focal vascular abnormality. 3. 10 mm homogeneously dense ovoid lesion within the right parotid gland likely represents an intraparotid lymph node. Recommend follow-up CT of the neck with contrast at 06-12 months to assure stability. **An incidental finding of potential clinical significance has been found. Right parotid density** Electronically Signed   By: Marin Robertshristopher  Mattern M.D.   On: 01/23/2017 15:41   Ct Angio Neck W And/or Wo Contrast  Result Date: 01/23/2017 CLINICAL DATA:  Intermittent headaches and dizziness. Nausea. Presyncopal sensation. Right-sided headache. EXAM: CT ANGIOGRAPHY HEAD AND NECK TECHNIQUE: Multidetector CT imaging of the head and neck was  performed using the standard protocol during bolus administration of intravenous contrast. Multiplanar CT image reconstructions and MIPs were obtained to evaluate the vascular anatomy. Carotid stenosis measurements (when applicable) are obtained utilizing NASCET criteria, using the distal internal carotid diameter as the denominator. CONTRAST:  75mL ISOVUE-370 IOPAMIDOL (ISOVUE-370) INJECTION 76% COMPARISON:  None. FINDINGS: CT HEAD FINDINGS Brain: No acute infarct, hemorrhage, or mass lesion is present. No significant extraaxial fluid collection is present. The ventricles are of normal size. Vascular: No hyperdense vessel or unexpected calcification. Skull: Calvarium is intact. No focal lytic or blastic lesions are present. Sinuses: The paranasal sinuses and mastoid air cells are clear. Orbits: Globes and orbits are within normal limits. Review of the MIP images confirms the above findings CTA NECK FINDINGS Aortic arch: A 3 vessel arch configuration is present. No significant vascular calcification or stenosis is present. Right carotid system: The right common carotid artery is within normal limits. Bifurcation is normal. There is moderate tortuosity in the cervical right ICA without significant stenosis. Left carotid system: A left  common carotid artery is within normal limits. The left carotid bifurcation is normal. There is moderate tortuosity of the cervical left ICA without focal stenosis. Vertebral arteries: The vertebral arteries originate from the subclavian artery is bilaterally without focal stenosis. The vertebral arteries are codominant. There is no focal stenosis of either vertebral artery in the neck. Skeleton: Vertebral body heights and alignment are normal. No focal lytic or blastic lesions are present. Other neck: A 10 mm ovoid density is present within the superficial segment of the right parotid gland. Other periparotid lymph nodes are present bilaterally. There is no significant cervical  adenopathy. The submandibular glands are normal bilaterally. No focal mucosal or submucosal lesions are present. Upper chest: The lung apices are clear. The superior mediastinum is within normal limits. Review of the MIP images confirms the above findings CTA HEAD FINDINGS Anterior circulation: The internal carotid artery is are within normal limits from the skullbase through the ICA termini bilaterally. The A1 and M1 segments are normal. The anterior communicating artery is patent. MCA bifurcations are intact bilaterally. The ACA and MCA branch vessels are within normal limits. Posterior circulation: The vertebral arteries are codominant. PICA origins are visualized and normal. The vertebrobasilar junction is normal. The basilar artery is normal. Both posterior cerebral artery is originate from the basilar tip. PCA branch vessels are within normal limits. Venous sinuses: The dural sinuses are patent. The left transverse sinus is dominant. The straight sinus and deep cerebral veins are intact. Cortical veins are unremarkable. Anatomic variants: None Delayed phase: The postcontrast images demonstrate no pathologic enhancement. Review of the MIP images confirms the above findings IMPRESSION: 1. 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 This can be seen in the setting of chronic hypertension. 2. No other focal vascular abnormality. 3. 10 mm homogeneously dense ovoid lesion within the right parotid gland likely represents an intraparotid lymph node. Recommend follow-up CT of the neck with contrast at 06-12 months to assure stability. **An incidental finding of potential clinical significance has been found. Right parotid density** Electronically Signed   By: Marin Roberts M.D.   On: 01/23/2017 15:41     Assessment/Plan 1. Atherosclerosis of aorta (HCC) Recommend:  The patient has experienced past symptoms of embolization from his aortic  atherosclerosis and is at continued risk over the long-term for future episodes..   Given the severity of the patient's lower extremity symptoms the patient should undergo angiography and intervention.  Risk and benefits were reviewed the patient.  Indications for the procedure were reviewed.  All questions were answered, the patient agrees to proceed but wishes to proceed in early January   The patient should continue walking and begin a more formal exercise program.  The patient should continue antiplatelet therapy and aggressive treatment of the lipid abnormalities  The patient will follow up with me after the angiogram.   2. Acute venous embolism and thrombosis of deep vessels of distal end of right lower extremity (HCC) ABIs show resolution there are now normal we will move forward with repair of his aorta in January at his request.  We will have him follow-up in the office to review    Levora Dredge, MD  01/27/2017 7:59 PM

## 2017-03-19 DIAGNOSIS — I743 Embolism and thrombosis of arteries of the lower extremities: Secondary | ICD-10-CM | POA: Insufficient documentation

## 2017-03-19 DIAGNOSIS — E785 Hyperlipidemia, unspecified: Secondary | ICD-10-CM | POA: Insufficient documentation

## 2017-03-19 NOTE — Progress Notes (Signed)
MRN : [id number]  Thomas Hopkins is a 32 y.o. (January 07, 1986) male who presents with chief complaint of No chief complaint on file. Marland Kitchen.  History of Present Illness: The patient returns to the office for followup and review of the noninvasive studies.  There is been no further episodes of embolization.  There have been no interval changes in lower extremity symptoms. No interval shortening of the patient's claudication distance or development of rest pain symptoms. No new ulcers or wounds have occurred since the last visit.  No new symptoms to suggest he has had another embolism  Historically:  He is s/p right leg thromboembolectomy of the tibial vessels and angioplaty of the PT on 11/18/2016.  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  There have been no significant changes to the patient's overall health care.  The patient denies amaurosis fugax or recent TIA symptoms. There are no recent neurological changes noted. The patient denies history of DVT, PE or superficial thrombophlebitis. The patient denies recent episodes of angina or shortness of breath.      No outpatient medications have been marked as taking for the 03/20/17 encounter (Appointment) with Gilda CreaseSchnier, Latina CraverGregory G, MD.    Past Medical History:  Diagnosis Date  . DVT of lower extremity, bilateral (HCC)    stopped Eliquis in 2017    Past Surgical History:  Procedure Laterality Date  . ABDOMINAL AORTOGRAM W/LOWER EXTREMITY Right 11/18/2016   Procedure: ABDOMINAL AORTOGRAM W/LOWER EXTREMITY;  Surgeon: Renford DillsSchnier, Gregory G, MD;  Location: [facility] INVASIVE CV LAB;  Service: Cardiovascular;  Laterality: Right;  . LAPAROSCOPIC APPENDECTOMY N/A 10/14/2014   Procedure: APPENDECTOMY LAPAROSCOPIC;  Surgeon: Lattie Hawichard E Cooper, MD;  Location: [facility] ORS;  Service: General;  Laterality: N/A;  . TONSILLECTOMY      Social History Social History   Tobacco Use  . Smoking status: Current Some Day Smoker   Packs/day: 1.00    Years: 15.00    Pack years: 15.00  . Smokeless tobacco: Never Used  Substance Use Topics  . Alcohol use: Yes    Comment: rare consumption  . Drug use: No    Family History Family History  Problem Relation Age of Onset  . Diabetes Mother   . Diabetes Mellitus II Maternal Grandmother   . Rectal cancer Father     Allergies  Allergen Reactions  . Vicodin [Hydrocodone-Acetaminophen] Nausea And Vomiting    Headache     REVIEW OF SYSTEMS (Negative unless checked)  Constitutional: [] Weight loss  [] Fever  [] Chills Cardiac: [] Chest pain   [] Chest pressure   [] Palpitations   [] Shortness of breath when laying flat   [] Shortness of breath with exertion. Vascular:  [x] Pain in legs with walking   [] Pain in legs at rest  [] History of DVT   [] Phlebitis   [] Swelling in legs   [] Varicose veins   [] Non-healing ulcers Pulmonary:   [] Uses home oxygen   [] Productive cough   [] Hemoptysis   [] Wheeze  [] COPD   [] Asthma Neurologic:  [] Dizziness   [] Seizures   [] History of stroke   [] History of TIA  [] Aphasia   [] Vissual changes   [] Weakness or numbness in arm   [] Weakness or numbness in leg Musculoskeletal:   [] Joint swelling   [] Joint pain   [] Low back pain Hematologic:  [] Easy bruising  [] Easy bleeding   [] Hypercoagulable state   [] Anemic Gastrointestinal:  [] Diarrhea   [] Vomiting  [] Gastroesophageal reflux/heartburn   [] Difficulty swallowing. Genitourinary:  [] Chronic kidney  disease   [] Difficult urination  [] Frequent urination   [] Blood in urine Skin:  [] Rashes   [] Ulcers  Psychological:  [] History of anxiety   []  History of major depression.  Physical Examination  There were no vitals filed for this visit. There is no height or weight on file to calculate BMI. Gen: WD/WN, NAD Head: [place]/AT, No temporalis wasting.  Ear/Nose/Throat: Hearing grossly intact, nares w/o erythema or drainage Eyes: PER, EOMI, sclera nonicteric.  Neck: Supple, no large masses.   Pulmonary:  Good air  movement, no audible wheezing bilaterally, no use of accessory muscles.  Cardiac: RRR, no JVD Vascular: Vessel Right Left  Radial Palpable Palpable  Popliteal Palpable Palpable  PT Palpable Palpable  DP Trace Palpable Trace Palpable  Gastrointestinal: Non-distended. No guarding/no peritoneal signs.  Musculoskeletal: M/S 5/5 throughout.  No deformity or atrophy.  Neurologic: CN 2-12 intact. Symmetrical.  Speech is fluent. Motor exam as listed above. Psychiatric: Judgment intact, Mood & affect appropriate for pt's clinical situation. Dermatologic: No rashes or ulcers noted.  No changes consistent with cellulitis. Lymph : No lichenification or skin changes of chronic lymphedema.  CBC Lab Results  Component Value Date   WBC 11.6 (H) 01/23/2017   HGB 15.2 01/23/2017   HCT 46.1 01/23/2017   MCV 89.9 01/23/2017   PLT 260 01/23/2017    BMET    Component Value Date/Time   NA 136 01/23/2017 1103   K 3.7 01/23/2017 1103   CL 104 01/23/2017 1103   CO2 23 01/23/2017 1103   GLUCOSE 119 (H) 01/23/2017 1103   BUN 12 01/23/2017 1103   CREATININE 0.80 01/23/2017 1103   CALCIUM 8.8 (L) 01/23/2017 1103   GFRNONAA >60 01/23/2017 1103   GFRAA >60 01/23/2017 1103   CrCl cannot be calculated (Patient's most recent lab result is older than the maximum 21 days allowed.).  COAG Lab Results  Component Value Date   INR 1.05 11/18/2016   INR 1.23 10/23/2014    Radiology No results found.  Assessment/Plan 1. 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  No new symptoms to suggest he has had another embolism  Historically:  He is s/p right leg thromboembolectomy of the tibial vessels and angioplaty of the PT on 11/18/2016.  CT scan at that time showed thrombus/plaque in the distal aorta and right  common iliac.  Given the severity of the patient's lower extremity symptoms the patient should undergo CT angiography to confirm that this lesion is still present (given his 3 months of anticoagulation)and if it is we will set up intervention as previously discussed.  Risk and benefits were reviewed the patient.  Indications for the procedure were reviewed.  All questions were answered.   The patient should continue walking and begin a more formal exercise program.  The patient should continue antiplatelet therapy and aggressive treatment of the lipid abnormalities  The patient will follow up with me after the angiogram.   2. Deep vein thrombosis (DVT) of popliteal vein, unspecified chronicity, unspecified laterality (HCC) Currently on anticoagulation  3. Mixed hyperlipidemia Continue statin as ordered and reviewed, no changes at this time    Levora Dredge, MD  03/19/2017 12:04 PM

## 2017-03-20 ENCOUNTER — Ambulatory Visit (INDEPENDENT_AMBULATORY_CARE_PROVIDER_SITE_OTHER): Payer: MEDICAID | Admitting: Vascular Surgery

## 2017-03-20 ENCOUNTER — Encounter (INDEPENDENT_AMBULATORY_CARE_PROVIDER_SITE_OTHER): Payer: Self-pay | Admitting: Vascular Surgery

## 2017-03-20 VITALS — BP 119/77 | HR 63 | Resp 16 | Ht 68.0 in | Wt 307.0 lb

## 2017-03-20 DIAGNOSIS — I743 Embolism and thrombosis of arteries of the lower extremities: Secondary | ICD-10-CM

## 2017-03-20 DIAGNOSIS — I82439 Acute embolism and thrombosis of unspecified popliteal vein: Secondary | ICD-10-CM

## 2017-03-20 DIAGNOSIS — E782 Mixed hyperlipidemia: Secondary | ICD-10-CM

## 2017-03-23 ENCOUNTER — Other Ambulatory Visit: Payer: Self-pay

## 2017-03-23 ENCOUNTER — Emergency Department
Admission: EM | Admit: 2017-03-23 | Discharge: 2017-03-23 | Disposition: A | Discharge status: Home / Self Care | Payer: Self-pay | Attending: Emergency Medicine | Admitting: Emergency Medicine

## 2017-03-23 ENCOUNTER — Encounter: Payer: Self-pay | Admitting: Emergency Medicine

## 2017-03-23 DIAGNOSIS — F17200 Nicotine dependence, unspecified, uncomplicated: Secondary | ICD-10-CM | POA: Insufficient documentation

## 2017-03-23 DIAGNOSIS — Z7901 Long term (current) use of anticoagulants: Secondary | ICD-10-CM | POA: Insufficient documentation

## 2017-03-23 DIAGNOSIS — K0889 Other specified disorders of teeth and supporting structures: Secondary | ICD-10-CM | POA: Insufficient documentation

## 2017-03-23 DIAGNOSIS — Z7982 Long term (current) use of aspirin: Secondary | ICD-10-CM | POA: Insufficient documentation

## 2017-03-23 DIAGNOSIS — Z7902 Long term (current) use of antithrombotics/antiplatelets: Secondary | ICD-10-CM | POA: Insufficient documentation

## 2017-03-23 DIAGNOSIS — Z79899 Other long term (current) drug therapy: Secondary | ICD-10-CM | POA: Insufficient documentation

## 2017-03-23 DIAGNOSIS — I2510 Atherosclerotic heart disease of native coronary artery without angina pectoris: Secondary | ICD-10-CM | POA: Insufficient documentation

## 2017-03-23 MED ORDER — IBUPROFEN 600 MG PO TABS: 600.0000 mg | ORAL_TABLET | Freq: Four times a day (QID) | ORAL | 0 refills | Status: DC | PRN

## 2017-03-23 MED ORDER — AMOXICILLIN 500 MG PO CAPS: 500.0000 mg | ORAL_CAPSULE | Freq: Three times a day (TID) | ORAL | 0 refills | Status: DC

## 2017-03-23 MED ORDER — TRAMADOL HCL 50 MG PO TABS: 50.0000 mg | ORAL_TABLET | Freq: Four times a day (QID) | ORAL | 0 refills | Status: DC | PRN

## 2017-03-23 NOTE — Discharge Instructions (Signed)
May follow-up from the list of dental clinics below: ?

## 2017-03-23 NOTE — ED Triage Notes (Signed)
Pt to ED via POV c/o dental pain and headache. Pt states that he has had dental pain for the past 3-4 days. Pt states that his headache started yesterday. Pt denies N/V or blurred vision. Pt in NAD at this time.

## 2017-03-23 NOTE — ED Provider Notes (Signed)
[facility] Emergency Department Provider Note [email or web address]   ____________________________________________   First MD Initiated Contact with Patient 03/23/17 1621     (approximate)  I have reviewed the triage vital signs and the nursing notes.   HISTORY  Chief Complaint Dental Pain    HPI Thomas Hopkins is a 32 y.o. male patient complaining of dental pain for 3-4 days.  Patient has a long history of devitalized teeth does not follow-up with dentist 3-4 years.  Patient denies fever associated complaint.  Patient also states he has a frontal headache.  Patient denies nausea, vomiting, or blurry vision.  Patient rates his pain as a 10/10.  Patient described the pain as "throbbing".  Patient used over-the-counter Alka-Seltzer with no improvement.  Past Medical History:  Diagnosis Date  . DVT of lower extremity, bilateral (HCC)    stopped Eliquis in 2017    Patient Active Problem List   Diagnosis Date Noted  . 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 Hyperlipidemia 03/19/2017  . Atherosclerosis of aorta (HCC) 01/27/2017  . Ischemic foot pain at rest [facility]) 11/18/2016  . Acute venous embolism and thrombosis of deep vessels of distal lower extremity (HCC) 10/22/2014  . Deep venous thrombosis (HCC) 10/22/2014  . RLQ abdominal pain   . Abdominal pain, acute, right lower quadrant 10/13/2014    Past Surgical History:  Procedure Laterality Date  . ABDOMINAL AORTOGRAM W/LOWER EXTREMITY Right 11/18/2016   Procedure: ABDOMINAL AORTOGRAM W/LOWER EXTREMITY;  Surgeon: Renford Dills, MD;  Location: [facility] INVASIVE CV LAB;  Service: Cardiovascular;  Laterality: Right;  . LAPAROSCOPIC APPENDECTOMY N/A 10/14/2014   Procedure: APPENDECTOMY LAPAROSCOPIC;  Surgeon: Lattie Haw, MD;  Location: [facility] ORS;  Service: General;  Laterality: N/A;  . TONSILLECTOMY      Prior to Admission medications   Medication Sig Start Date End Date  Taking? Authorizing Provider  acetaminophen (TYLENOL) 500 MG tablet Take 500 mg by mouth every 6 (six) hours as needed.    [provider]  amoxicillin (AMOXIL) 500 MG capsule Take 1 capsule (500 mg total) by mouth 3 (three) times daily. 03/23/17   Joni Reining, PA-C  apixaban (ELIQUIS) 5 MG TABS tablet 2 five mg tablets twice a day (10mg ) for a week, then 1 five mg tablet twice a day for three months 10/23/14   [provider]  aspirin EC 325 MG tablet Take 1 tablet (325 mg total) by mouth daily. 11/19/16 11/19/17  Schnier, Latina Craver, MD  Aspirin-Acetaminophen-Caffeine [phone] MG PACK Take by mouth.    [provider]  atorvastatin (LIPITOR) 20 MG tablet Take 1 tablet (20 mg total) by mouth daily at 6 PM. 11/19/16   Schnier, Latina Craver, MD  clopidogrel (PLAVIX) 75 MG tablet Take 1 tablet (75 mg total) by mouth daily. 11/20/16   Schnier, Latina Craver, MD  ibuprofen (ADVIL,MOTRIN) 600 MG tablet Take 1 tablet (600 mg total) by mouth every 6 (six) hours as needed. 03/23/17   Joni Reining, PA-C  traMADol (ULTRAM) 50 MG tablet Take 1 tablet (50 mg total) by mouth every 6 (six) hours as needed for moderate pain. 03/23/17   Joni Reining, PA-C    Allergies Bee venom; Vicodin [hydrocodone-acetaminophen]; Hydrocodone-acetaminophen; Mushroom extract complex; and Olive oil  Family History  Problem Relation Age of Onset  . Diabetes Mother   . Diabetes Mellitus II Maternal Grandmother   . Rectal cancer Father     Social History Social History  Tobacco Use  . Smoking status: Current Some Day Smoker    Packs/day: 1.00    Years: 15.00    Pack years: 15.00  . Smokeless tobacco: Never Used  Substance Use Topics  . Alcohol use: Yes    Comment: rare consumption  . Drug use: No    Review of Systems Constitutional: No fever/chills Eyes: No visual changes. ENT: No sore throat. Cardiovascular: Denies chest pain. Respiratory: Denies shortness of breath. Gastrointestinal: No  abdominal pain.  No nausea, no vomiting.  No diarrhea.  No constipation. Genitourinary: Negative for dysuria. Musculoskeletal: Negative for back pain. Skin: Negative for rash. Neurological: Negative for headaches, focal weakness or numbness. Allergic/Immunilogical: See medication list ____________________________________________   PHYSICAL EXAM:  VITAL SIGNS: ED Triage Vitals [03/23/17 1601]  Enc Vitals Group     BP 139/84     Pulse Rate 63     Resp 16     Temp 98.5 F (36.9 C)     Temp src      SpO2 96 %     Weight      Height      Head Circumference      Peak Flow      Pain Score 10     Pain Loc      Pain Edu?      Excl. in GC?    Constitutional: Alert and oriented. Well appearing and in no acute distress. Eyes: Conjunctivae are normal. PERRL. EOMI. Head: Atraumatic. Nose: No congestion/rhinnorhea. Mouth/Throat: Mucous membranes are moist.  Oropharynx non-erythematous.  Multiple caries and devitalized teeth. Neck: No stridor.   Cardiovascular: Normal rate, regular rhythm. Grossly normal heart sounds.  Good peripheral circulation. Respiratory: Normal respiratory effort.  No retractions. Lungs CTAB. Skin:  Skin is warm, dry and intact. No rash noted. Psychiatric: Mood and affect are normal. Speech and behavior are normal.  ____________________________________________   LABS (all labs ordered are listed, but only abnormal results are displayed)  Labs Reviewed - No data to display ____________________________________________  EKG   ____________________________________________  RADIOLOGY  No results found.  ____________________________________________   PROCEDURES  Procedure(s) performed: None  Procedures  Critical Care performed: No  ____________________________________________   INITIAL IMPRESSION / ASSESSMENT AND PLAN / ED COURSE  As part of my medical decision making, I reviewed the following data within the electronic MEDICAL RECORD NUMBER     Dental pain secondary to multiple caries and devitalized teeth.  Patient given discharge care instructions advised to follow-up with the dental clinic from list provided.  Patient advised take medication as directed.      ____________________________________________   FINAL CLINICAL IMPRESSION(S) / ED DIAGNOSES  Final diagnoses:  Dentalgia     ED Discharge Orders        Ordered    amoxicillin (AMOXIL) 500 MG capsule  3 times daily     03/23/17 1635    traMADol (ULTRAM) 50 MG tablet  Every 6 hours PRN     03/23/17 1635    ibuprofen (ADVIL,MOTRIN) 600 MG tablet  Every 6 hours PRN     03/23/17 1635       Note:  This document was prepared using Dragon voice recognition software and may include unintentional dictation errors.    Joni ReiningSmith, Ronald K, PA-C 03/23/17 1642    Arnaldo NatalMalinda, Paul F, MD 03/23/17 2123

## 2017-04-10 ENCOUNTER — Ambulatory Visit
Admission: RE | Admit: 2017-04-10 | Discharge: 2017-04-10 | Disposition: A | Discharge status: Home / Self Care | Payer: Self-pay | Source: Ambulatory Visit | Attending: Vascular Surgery | Admitting: Vascular Surgery

## 2017-04-10 DIAGNOSIS — I743 Embolism and thrombosis of arteries of the lower extremities: Secondary | ICD-10-CM | POA: Insufficient documentation

## 2017-04-10 MED ORDER — IOPAMIDOL (ISOVUE-370) INJECTION 76%
125.0000 mL | Freq: Once | INTRAVENOUS | Status: AC | PRN
  Administered 2017-04-10: 125 mL via INTRAVENOUS

## 2017-04-15 ENCOUNTER — Emergency Department
Admission: EM | Admit: 2017-04-15 | Discharge: 2017-04-15 | Disposition: A | Discharge status: Home / Self Care | Payer: Self-pay | Attending: Emergency Medicine | Admitting: Emergency Medicine

## 2017-04-15 ENCOUNTER — Other Ambulatory Visit: Payer: Self-pay

## 2017-04-15 DIAGNOSIS — H60312 Diffuse otitis externa, left ear: Secondary | ICD-10-CM | POA: Insufficient documentation

## 2017-04-15 DIAGNOSIS — F17200 Nicotine dependence, unspecified, uncomplicated: Secondary | ICD-10-CM | POA: Insufficient documentation

## 2017-04-15 DIAGNOSIS — Z7902 Long term (current) use of antithrombotics/antiplatelets: Secondary | ICD-10-CM | POA: Insufficient documentation

## 2017-04-15 DIAGNOSIS — H6502 Acute serous otitis media, left ear: Secondary | ICD-10-CM | POA: Insufficient documentation

## 2017-04-15 DIAGNOSIS — H6500 Acute serous otitis media, unspecified ear: Secondary | ICD-10-CM

## 2017-04-15 DIAGNOSIS — Z79899 Other long term (current) drug therapy: Secondary | ICD-10-CM | POA: Insufficient documentation

## 2017-04-15 MED ORDER — PREDNISONE 10 MG PO TABS: 30.0000 mg | ORAL_TABLET | Freq: Every day | ORAL | 0 refills | Status: DC

## 2017-04-15 MED ORDER — CEPHALEXIN 500 MG PO CAPS: 500.0000 mg | ORAL_CAPSULE | Freq: Three times a day (TID) | ORAL | 0 refills | Status: DC

## 2017-04-15 NOTE — Discharge Instructions (Signed)
Follow-up with your regular doctor if you are not better in 3 days.  Use medication as prescribed.  You were recently on amoxicillin so we prescribed Keflex.  If you are worsening please return the emergency department

## 2017-04-15 NOTE — ED Provider Notes (Signed)
[facility] Emergency Department Provider Note  ____________________________________________   First MD Initiated Contact with Patient 04/15/17 [phone]     (approximate)  I have reviewed the triage vital signs and the nursing notes.   HISTORY  Chief Complaint Otalgia    HPI Lanier EnsignJonathan B Tamblyn is a 32 y.o. male complains of left ear pain.  States he is swollen at the left side of his neck.  He denies fever or chills.  He denies chest pain or shortness of breath.  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  He noticed the lump in the neck yesterday.  He is concerned for blood clot.  He is currently taking Plavix.  He denies cough, runny nose, congestion, vomiting or diarrhea  Past Medical History:  Diagnosis Date  . DVT of lower extremity, bilateral (HCC)    stopped Eliquis in 2017    Patient Active Problem List   Diagnosis Date Noted  . 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 Hyperlipidemia 03/19/2017  . Atherosclerosis of aorta (HCC) 01/27/2017  . Ischemic foot pain at rest [facility](HCC) 11/18/2016  . Acute venous embolism and thrombosis of deep vessels of distal lower extremity (HCC) 10/22/2014  . Deep venous thrombosis (HCC) 10/22/2014  . RLQ abdominal pain   . Abdominal pain, acute, right lower quadrant 10/13/2014    Past Surgical History:  Procedure Laterality Date  . ABDOMINAL AORTOGRAM W/LOWER EXTREMITY Right 11/18/2016   Procedure: ABDOMINAL AORTOGRAM W/LOWER EXTREMITY;  Surgeon: Renford DillsSchnier, Gregory G, MD;  Location: [facility] INVASIVE CV LAB;  Service: Cardiovascular;  Laterality: Right;  . LAPAROSCOPIC APPENDECTOMY N/A 10/14/2014   Procedure: APPENDECTOMY LAPAROSCOPIC;  Surgeon: Lattie Hawichard E Cooper, MD;  Location: [facility] ORS;  Service: General;  Laterality: N/A;  . TONSILLECTOMY      Prior to Admission medications   Medication Sig Start Date End Date Taking? Authorizing Provider  acetaminophen (TYLENOL) 500 MG tablet Take 500  mg by mouth every 6 (six) hours as needed.    [provider]  apixaban (ELIQUIS) 5 MG TABS tablet 2 five mg tablets twice a day (10mg ) for a week, then 1 five mg tablet twice a day for three months 10/23/14   [provider]  aspirin EC 325 MG tablet Take 1 tablet (325 mg total) by mouth daily. 11/19/16 11/19/17  Schnier, Latina CraverGregory G, MD  Aspirin-Acetaminophen-Caffeine [id number] MG PACK Take by mouth.    [provider]  atorvastatin (LIPITOR) 20 MG tablet Take 1 tablet (20 mg total) by mouth daily at 6 PM. 11/19/16   Schnier, Latina CraverGregory G, MD  cephALEXin (KEFLEX) 500 MG capsule Take 1 capsule (500 mg total) by mouth 3 (three) times daily. 04/15/17   Sherrie MustacheFisher, Roselyn BeringSusan W, PA-C  clopidogrel (PLAVIX) 75 MG tablet Take 1 tablet (75 mg total) by mouth daily. 11/20/16   Schnier, Latina CraverGregory G, MD  ibuprofen (ADVIL,MOTRIN) 600 MG tablet Take 1 tablet (600 mg total) by mouth every 6 (six) hours as needed. 03/23/17   Joni ReiningSmith, Ronald K, PA-C  predniSONE (DELTASONE) 10 MG tablet Take 3 tablets (30 mg total) by mouth daily with breakfast. 04/15/17   Sherrie MustacheFisher, Roselyn BeringSusan W, PA-C    Allergies Bee venom; Vicodin [hydrocodone-acetaminophen]; Hydrocodone-acetaminophen; Mushroom extract complex; and Olive oil  Family History  Problem Relation Age of Onset  . Diabetes Mother   . Diabetes Mellitus II Maternal Grandmother   . Rectal cancer Father     Social History Social History   Tobacco  Use  . Smoking status: Current Some Day Smoker    Packs/day: 1.00    Years: 15.00    Pack years: 15.00  . Smokeless tobacco: Never Used  Substance Use Topics  . Alcohol use: Yes    Comment: rare consumption  . Drug use: No    Review of Systems  Constitutional: No fever/chills Eyes: No visual changes. ENT: No sore throat.  Positive for left ear pain.  Positive for swelling below the left ear Respiratory: Denies cough Gastrointestinal: Denies vomiting or diarrhea Genitourinary: Negative for dysuria. Musculoskeletal:  Negative for back pain. Skin: Negative for rash.    ____________________________________________   PHYSICAL EXAM:  VITAL SIGNS: ED Triage Vitals  Enc Vitals Group     BP 04/15/17 0513 138/70     Pulse Rate 04/15/17 0513 66     Resp 04/15/17 0513 18     Temp 04/15/17 0513 98.5 F (36.9 C)     Temp Source 04/15/17 0513 Oral     SpO2 04/15/17 0513 95 %     Weight 04/15/17 0512 300 lb (136.1 kg)     Height 04/15/17 0512 5\' 9"  (1.753 m)     Head Circumference --      Peak Flow --      Pain Score 04/15/17 0512 10     Pain Loc --      Pain Edu? --      Excl. in GC? --     Constitutional: Alert and oriented. Well appearing and in no acute distress. Eyes: Conjunctivae are normal.  Head: Atraumatic. Ears: The right ear shows a red tympanic membrane.  The area is dull and there are no landmarks noted.  The left ear canal is red and swollen with some exudate.  The left tympanic membrane is red swollen with no landmarks noted Neck: Left side of the neck has upper cervical lymphadenopathy.  Some postauricular lymphadenopathy Nose: No congestion/rhinnorhea. Mouth/Throat: Mucous membranes are moist.  Throat is normal Cardiovascular: Normal rate, regular rhythm.  Heart sounds are normal Respiratory: Normal respiratory effort.  No retractions, lungs clear to auscultation GU: deferred Musculoskeletal: FROM all extremities, warm and well perfused Neurologic:  Normal speech and language.  Skin:  Skin is warm, dry and intact. No rash noted. Psychiatric: Mood and affect are normal. Speech and behavior are normal.  ____________________________________________   LABS (all labs ordered are listed, but only abnormal results are displayed)  Labs Reviewed - No data to display ____________________________________________   ____________________________________________  RADIOLOGY    ____________________________________________   PROCEDURES  Procedure(s) performed:  No  Procedures    ____________________________________________   INITIAL IMPRESSION / ASSESSMENT AND PLAN / ED COURSE  Pertinent labs & imaging results that were available during my care of the patient were reviewed by me and considered in my medical decision making (see chart for details).  Patient is 32 year old male complaining of left ear pain and swelling below the left ear.  He denies fever or chills.  On physical exam the left ear canal is red and swollen, the left TM is red and swollen with no landmarks noted.  Right TM is also red and swollen with no landmarks.  Remainder of the exam is benign  Diagnosis acute otitis externa and acute otitis media with lymphadenopathy.  Patient was prescribed Keflex 500 mg 3 times a day, prednisone 30 mg daily for 3 days.  Patient was recently on amoxicillin so instructed the patient to take a probiotic.  He states he understands  will comply.  He was discharged in stable condition     As part of my medical decision making, I reviewed the following data within the electronic MEDICAL RECORD NUMBER Nursing notes reviewed and incorporated, Old chart reviewed, Notes from prior ED visits and [place] Controlled Substance Database  ____________________________________________   FINAL CLINICAL IMPRESSION(S) / ED DIAGNOSES  Final diagnoses:  Acute serous otitis media, recurrence not specified, unspecified laterality  Acute diffuse otitis externa of left ear      NEW MEDICATIONS STARTED DURING THIS VISIT:  New Prescriptions   CEPHALEXIN (KEFLEX) 500 MG CAPSULE    Take 1 capsule (500 mg total) by mouth 3 (three) times daily.   PREDNISONE (DELTASONE) 10 MG TABLET    Take 3 tablets (30 mg total) by mouth daily with breakfast.     Note:  This document was prepared using Dragon voice recognition software and may include unintentional dictation errors.    Faythe Ghee, PA-C 04/15/17 1610    Jeanmarie Plant, MD 04/15/17 [phone]

## 2017-04-15 NOTE — ED Triage Notes (Signed)
Patient reports left ear pain that radiates into left jaw.

## 2017-05-11 ENCOUNTER — Encounter (INDEPENDENT_AMBULATORY_CARE_PROVIDER_SITE_OTHER): Payer: Self-pay | Admitting: Vascular Surgery

## 2017-05-11 ENCOUNTER — Ambulatory Visit (INDEPENDENT_AMBULATORY_CARE_PROVIDER_SITE_OTHER): Payer: Self-pay | Admitting: Vascular Surgery

## 2017-05-11 VITALS — BP 118/77 | HR 56 | Resp 14 | Ht 69.0 in | Wt 311.0 lb

## 2017-05-11 DIAGNOSIS — E782 Mixed hyperlipidemia: Secondary | ICD-10-CM

## 2017-05-11 DIAGNOSIS — I743 Embolism and thrombosis of arteries of the lower extremities: Secondary | ICD-10-CM

## 2017-05-11 DIAGNOSIS — I82439 Acute embolism and thrombosis of unspecified popliteal vein: Secondary | ICD-10-CM

## 2017-05-11 NOTE — Progress Notes (Signed)
MRN : [id number]  Thomas Hopkins is a 32 y.o. (September 09, 1985) male who presents with chief complaint of No chief complaint on file. Marland Kitchen  History of Present Illness: The patient returns to the office for review of the CT scan.There is been no further episodes of embolization. There have been no interval changes in lower extremity symptoms. No interval shortening of the patient's claudication distance or development of rest pain symptoms. No new ulcers or wounds have occurred since the last visit.  No new symptoms to suggest he has had another embolism  Historically:  He is s/p right leg thromboembolectomy of the tibial vessels and angioplaty of the PT on 11/18/2016.  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  There have been no significant changes to the patient's overall health care.  He does note he is taking antibiotics for an ear infection  The patient denies amaurosis fugax or recent TIA symptoms. There are no recent neurological changes noted. The patient denies history of DVT, PE or superficial thrombophlebitis.  CT shows resolution of the distal aortic thrombus with preservation of the iliofemoral and popliteal system   No outpatient medications have been marked as taking for the 05/11/17 encounter (Appointment) with Gilda Crease, Latina Craver, MD.    Past Medical History:  Diagnosis Date  . DVT of lower extremity, bilateral (HCC)    stopped Eliquis in 2017    Past Surgical History:  Procedure Laterality Date  . ABDOMINAL AORTOGRAM W/LOWER EXTREMITY Right 11/18/2016   Procedure: ABDOMINAL AORTOGRAM W/LOWER EXTREMITY;  Surgeon: Renford Dills, MD;  Location: [facility];  Service: Cardiovascular;  Laterality: Right;  . LAPAROSCOPIC APPENDECTOMY N/A 10/14/2014   Procedure: APPENDECTOMY LAPAROSCOPIC;  Surgeon: Lattie Haw, MD;  Location: [facility];  Service: General;  Laterality: N/A;  . TONSILLECTOMY      Social History Social  History   Tobacco Use  . Smoking status: Current Some Day Smoker    Packs/day: 1.00    Years: 15.00    Pack years: 15.00  . Smokeless tobacco: Never Used  Substance Use Topics  . Alcohol use: Yes    Comment: rare consumption  . Drug use: No    Family History Family History  Problem Relation Age of Onset  . Diabetes Mother   . Diabetes Mellitus II Maternal Grandmother   . Rectal cancer Father     Allergies  Allergen Reactions  . Bee Venom Hives  . Vicodin [Hydrocodone-Acetaminophen] Nausea And Vomiting    Headache  . Hydrocodone-Acetaminophen Nausea And Vomiting    Headache  . Mushroom Extract Complex Nausea And Vomiting  . Olive Oil Nausea And Vomiting     REVIEW OF SYSTEMS (Negative unless checked)  Constitutional: [] Weight loss  [] Fever  [] Chills Cardiac: [] Chest pain   [] Chest pressure   [] Palpitations   [] Shortness of breath when laying flat   [] Shortness of breath with exertion. Vascular:  [] Pain in legs with walking   [] Pain in legs at rest  [] History of DVT   [] Phlebitis   [] Swelling in legs   [] Varicose veins   [] Non-healing ulcers Pulmonary:   [] Uses home oxygen   [] Productive cough   [] Hemoptysis   [] Wheeze  [] COPD   [] Asthma Neurologic:  [] Dizziness   [] Seizures   [] History of stroke   [] History of TIA  [] Aphasia   [] Vissual changes   [] Weakness or numbness in arm   [] Weakness or numbness in leg Musculoskeletal:   []   Joint swelling   [] Joint pain   [] Low back pain Hematologic:  [] Easy bruising  [] Easy bleeding   [] Hypercoagulable state   [] Anemic Gastrointestinal:  [] Diarrhea   [] Vomiting  [] Gastroesophageal reflux/heartburn   [] Difficulty swallowing. Genitourinary:  [] Chronic kidney disease   [] Difficult urination  [] Frequent urination   [] Blood in urine Skin:  [] Rashes   [] Ulcers  Psychological:  [] History of anxiety   []  History of major depression.  Physical Examination  There were no vitals filed for this visit. There is no height or weight on file to  calculate BMI. Gen: WD/WN, NAD Head: [place]/AT, No temporalis wasting.  Ear/Nose/Throat: Hearing grossly intact, nares w/o erythema or drainage Eyes: PER, EOMI, sclera nonicteric.  Neck: Supple, no large masses.   Pulmonary:  Good air movement, no audible wheezing bilaterally, no use of accessory muscles.  Cardiac: RRR, no JVD Vascular:  Vessel Right Left  Radial Palpable Palpable  Gastrointestinal: Non-distended. No guarding/no peritoneal signs.  Musculoskeletal: M/S 5/5 throughout.  No deformity or atrophy.  Neurologic: CN 2-12 intact. Symmetrical.  Speech is fluent. Motor exam as listed above. Psychiatric: Judgment intact, Mood & affect appropriate for pt's clinical situation. Dermatologic: No rashes or ulcers noted.  No changes consistent with cellulitis. Lymph : No lichenification or skin changes of chronic lymphedema.  CBC Lab Results  Component Value Date   WBC 11.6 (H) 01/23/2017   HGB 15.2 01/23/2017   HCT 46.1 01/23/2017   MCV 89.9 01/23/2017   PLT 260 01/23/2017    BMET    Component Value Date/Time   NA 136 01/23/2017 1103   K 3.7 01/23/2017 1103   CL 104 01/23/2017 1103   CO2 23 01/23/2017 1103   GLUCOSE 119 (H) 01/23/2017 1103   BUN 12 01/23/2017 1103   CREATININE 0.80 01/23/2017 1103   CALCIUM 8.8 (L) 01/23/2017 1103   GFRNONAA >60 01/23/2017 1103   GFRAA >60 01/23/2017 1103   CrCl cannot be calculated (Patient's most recent lab result is older than the maximum 21 days allowed.).  COAG Lab Results  Component Value Date   INR 1.05 11/18/2016   INR 1.23 10/23/2014    Radiology Ct Angio Ao+bifem W & Or Wo Contrast  Result Date: 04/11/2017 CLINICAL DATA:  Bilateral intermittent leg pain for the past 2 years. EXAM: CT ANGIOGRAPHY OF ABDOMINAL AORTA WITH ILIOFEMORAL RUNOFF TECHNIQUE: Multidetector CT imaging of the abdomen, pelvis and lower extremities was performed using the standard protocol during bolus administration of intravenous contrast. Multiplanar  CT image reconstructions and MIPs were obtained to evaluate the vascular anatomy. Note, initial contrast administration on 04/10/2017 was timed incorrectly and as such, the patient returned the following day on 04/11/2017 for completion of the examination. CONTRAST:  125 cc Isovue-300 (administered 04/10/2017) 100 cc Isovue 300 (administered on 04/11/2017). COMPARISON:  CTA run-off - 11/18/2016; CT abdomen and pelvis - 10/13/2014 FINDINGS: VASCULAR Aorta: Normal caliber of the abdominal aorta. Previously noted irregular nonocclusive intimal thickening involving the distal aspect of the abdominal aorta extending to involve the right common iliac artery has nearly resolved in the interval with minimal amount of slightly eccentric intimal wall thickening remaining at this location (image 132, series 12). No abdominal aortic dissection or periaortic stranding. Celiac: Widely patent without a hemodynamically significant narrowing. Conventional branching pattern. SMA: Widely patent without a hemodynamically significant narrowing. Conventional branching pattern. The distal tributaries of the SMA are widely patent without discrete intraluminal filling defect to suggest distal embolism. Renals: Duplicated bilaterally. There are tiny accessory renal artery  supplying the inferior poles of the bilateral kidneys. There is very mild beaded irregularity involving the bilateral renal arteries (coronal image 101, series 14; coronal image 106, series 14), not resulting in a hemodynamically significant stenosis though could be seen in the setting of FMD. No vessel dissection or perivascular stranding. No evidence of end organ ischemia or geographic atrophy. IMA: Widely patent without hemodynamically significant narrowing. RIGHT Lower Extremity Inflow: The right common, external and internal iliac arteries are of normal caliber and widely patent without hemodynamically significant stenosis. Outflow: The right common femoral artery is of  normal caliber and widely patent without a hemodynamically significant narrowing. The right deep femoral artery is widely patent without a hemodynamically significant narrowing. The right superficial femoral artery is widely patent without hemodynamically significant narrowing. The right above and below-knee popliteal artery is widely patent without a hemodynamically significant narrowing. Runoff: Predominantly 2 vessel runoff to the right lower leg and foot via the right posterior tibial and peroneal arteries. There is short-segment occlusion of the right anterior tibial artery (image 312, 41) with reconstitution at the level of the mid tibia (image 334). The right-sided dorsalis pedis artery is patent to the level of the hindfoot. LEFT Lower Extremity Inflow: The left common, external and internal iliac arteries are of normal caliber and widely patent without hemodynamically significant narrowing. Outflow: The left common femoral artery is widely patent without hemodynamically significant narrowing. The left deep femoral artery is widely patent without hemodynamically significant narrowing. The left superficial femoral artery is widely patent without hemodynamically significant stenosis. The left above and below-knee popliteal arteries are widely patent without hemodynamically significant narrowing. Runoff: The tibioperoneal trunk appears atretic in comparison to the contralateral right lower extremity (image 278, series 41, likely the sequela of previous arterial thrombus at this location. There is a three-vessel runoff to the left lower leg and foot. The left-sided dorsalis pedis artery is patent to the level of the hindfoot. Veins: The IVC and pelvic venous system appears widely patent. Review of the MIP images confirms the above findings. NON-VASCULAR Evaluation of the abdominal organs is largely limited to the arterial phase of enhancement. Lower chest: Limited visualization of the lower thorax demonstrates  minimal dependent subpleural ground-glass atelectasis. No discrete focal airspace opacities. No pleural effusion. Normal heart size. No pericardial effusion. Hepatobiliary: Normal hepatic contour. There is diffuse decreased attenuation of the hepatic parenchyma on this postcontrast examination suggestive of hepatic steatosis. No discrete hyperenhancing hepatic lesions. Normal appearance of the gallbladder given degree distention. No radiopaque gallstones. No intra extrahepatic bili duct dilatation. No ascites. Pancreas: Normal appearance of the pancreas Spleen: Normal appearance of the spleen. Note is made of a tiny splenule. Adrenals/Urinary Tract: There is symmetric enhancement of the bilateral kidneys. No definite renal stones on this postcontrast examination. No discrete renal lesions. No urine obstruction or perinephric stranding. Normal appearance of the bilateral adrenal glands. Normal appearance of the urinary bladder given degree distention. Stomach/Bowel: Moderate colonic stool burden without evidence of enteric obstruction. Normal appearance of the terminal ileum. Post appendectomy. No pneumoperitoneum, pneumatosis or portal venous gas. Lymphatic: Scattered porta hepatis and bilateral external iliac chain and inguinal lymph nodes are prominent though individually not enlarged by size criteria and morphologically similar to the 10/2014 examination and thus presumably reactive secondary to patient body habitus. No bulky retroperitoneal, mesenteric, pelvic or inguinal lymphadenopathy. Reproductive: Normal appearance of the prostate gland. No free fluid in the pelvic cul-de-sac. Other: Tiny mesenteric fat containing periumbilical hernia. Subcutaneous edema is noted  about the midline of the low back as well as the bilateral flanks. Musculoskeletal: No acute or aggressive osseous abnormalities. IMPRESSION: VASCULAR 1. Marked improvement in previously noted irregular intimal wall thickening involving the distal  aspect of the abdominal aorta with very minimal amount of residual intimal thickening at this location, not resulting in hemodynamically significant stenosis. No abdominal aortic dissection or periaortic stranding. No evidence of distal embolism. 2. 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 No evidence of end organ ischemia. 3. Marked improvement in previously noted bilateral distal emboli. No evidence of acute embolism/thrombosis. 4. Predominantly two vessel runoff to the right lower leg and foot with short-segment occlusion of the anterior tibial artery with reconstitution at the lower leg, likely the sequela prior arterial thrombus/distal embolism at this location. The right-sided dorsalis pedis artery is patent to the level of the hindfoot. 5. Three-vessel runoff to the left lower leg and foot, however the left tibioperoneal trunk appears atretic in comparison to the right lower extremity, likely the sequela of prior arterial thrombus/distal embolism at this location. The left-sided dorsalis pedis artery is patent to the level of the hindfoot. NON-VASCULAR 1. No acute findings within the abdomen or pelvis. 2. Suspected hepatic steatosis. Electronically Signed   By: Simonne Come M.D.   On: 04/11/2017 10:29    Assessment/Plan 1. 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  No new symptoms to suggest he has had another embolism  Historically:  He is s/p right leg thromboembolectomy of the tibial vessels and angioplaty of the PT on 11/18/2016.  CT scan at that time showed thrombus/plaque in the distal aorta and right common iliac.  CT scan shows resolution of the  mural thrombus in the aorta and iliac arteries.  Therefore surgery is not indicated at this time.  I would continue his anticoagulation and statin therapy as this appears to have been very effective treatment.  Hyer coag panel with be obtained.  The patient should continue walking and begin a more formal exercise program.  The patient should continue antiplatelet therapy and aggressive treatment of the lipid abnormalities  - Hypercoagulable panel, comprehensive  2. Deep vein thrombosis (DVT) of popliteal vein, unspecified chronicity, unspecified laterality (HCC) Continue Eliquis  3. Mixed hyperlipidemia Continue statin as ordered and reviewed, no changes at this time    Levora Dredge, MD  05/11/2017 8:52 AM

## 2017-06-12 ENCOUNTER — Encounter (INDEPENDENT_AMBULATORY_CARE_PROVIDER_SITE_OTHER): Payer: Self-pay | Admitting: Vascular Surgery

## 2017-06-12 ENCOUNTER — Ambulatory Visit (INDEPENDENT_AMBULATORY_CARE_PROVIDER_SITE_OTHER): Payer: Self-pay | Admitting: Vascular Surgery

## 2017-06-12 VITALS — BP 113/72 | HR 5 | Resp 16 | Ht 69.0 in | Wt 316.0 lb

## 2017-06-12 DIAGNOSIS — E782 Mixed hyperlipidemia: Secondary | ICD-10-CM

## 2017-06-12 DIAGNOSIS — I82439 Acute embolism and thrombosis of unspecified popliteal vein: Secondary | ICD-10-CM

## 2017-06-12 DIAGNOSIS — I743 Embolism and thrombosis of arteries of the lower extremities: Secondary | ICD-10-CM

## 2017-06-12 DIAGNOSIS — I700 Atherosclerosis of aorta: Secondary | ICD-10-CM

## 2017-06-14 ENCOUNTER — Encounter (INDEPENDENT_AMBULATORY_CARE_PROVIDER_SITE_OTHER): Payer: Self-pay | Admitting: Vascular Surgery

## 2017-06-14 NOTE — Progress Notes (Signed)
MRN : [id number]  Thomas Hopkins is a 32 y.o. (1985/08/29) male who presents with chief complaint of  Chief Complaint  Patient presents with  . Follow-up    28month follow up  .  History of Present Illness:   The patient returns to the office for follow up.There has been no further episodes of embolization. There have been no interval changes in lower extremity symptoms. No interval shortening of the patient's claudication distance or development of rest pain symptoms. No new ulcers or wounds have occurred since the last visit.No new symptoms to suggest he has had another embolism  Historically: He is s/p right leg thromboembolectomy of the tibial vessels and angioplaty of the PT on 11/18/2016. 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  There have been no significant changes to the patient's overall health care.  He does note he is taking antibiotics for an ear infection  The patient denies amaurosis fugax or recent TIA symptoms. There are no recent neurological changes noted. The patient denies history of DVT, PE or superficial thrombophlebitis.  CT shows resolution of the distal aortic thrombus with preservation of the iliofemoral and popliteal system    Current Meds  Medication Sig  . acetaminophen (TYLENOL) 500 MG tablet Take 500 mg by mouth every 6 (six) hours as needed.  Marland Kitchen. apixaban (ELIQUIS) 5 MG TABS tablet 2 five mg tablets twice a day (10mg ) for a week, then 1 five mg tablet twice a day for three months  . aspirin EC 325 MG tablet Take 1 tablet (325 mg total) by mouth daily.  . Aspirin-Acetaminophen-Caffeine [phone] MG PACK Take by mouth.  Marland Kitchen. atorvastatin (LIPITOR) 20 MG tablet Take 1 tablet (20 mg total) by mouth daily at 6 PM.  . clopidogrel (PLAVIX) 75 MG tablet Take 1 tablet (75 mg total) by mouth daily.  . ranitidine (ZANTAC) 150 MG tablet Take 150 mg by mouth 2 (two) times daily.    Past Medical History:    Diagnosis Date  . DVT of lower extremity, bilateral (HCC)    stopped Eliquis in 2017    Past Surgical History:  Procedure Laterality Date  . ABDOMINAL AORTOGRAM W/LOWER EXTREMITY Right 11/18/2016   Procedure: ABDOMINAL AORTOGRAM W/LOWER EXTREMITY;  Surgeon: Renford DillsSchnier, Zenovia Justman G, MD;  Location: [facility];  Service: Cardiovascular;  Laterality: Right;  . LAPAROSCOPIC APPENDECTOMY N/A 10/14/2014   Procedure: APPENDECTOMY LAPAROSCOPIC;  Surgeon: Lattie Hawichard E Cooper, MD;  Location: [facility];  Service: General;  Laterality: N/A;  . TONSILLECTOMY      Social History Social History   Tobacco Use  . Smoking status: Current Some Day Smoker    Packs/day: 1.00    Years: 15.00    Pack years: 15.00  . Smokeless tobacco: Never Used  Substance Use Topics  . Alcohol use: Yes    Comment: rare consumption  . Drug use: No    Family History Family History  Problem Relation Age of Onset  . Diabetes Mother   . Diabetes Mellitus II Maternal Grandmother   . Rectal cancer Father     Allergies  Allergen Reactions  . Bee Venom Hives  . Vicodin [Hydrocodone-Acetaminophen] Nausea And Vomiting    Headache  . Hydrocodone-Acetaminophen Nausea And Vomiting    Headache  . Mushroom Extract Complex Nausea And Vomiting  . Olive Oil Nausea And Vomiting     REVIEW OF SYSTEMS (Negative unless checked)  Constitutional: [] Weight loss  [] Fever  []   Chills Cardiac: [] Chest pain   [] Chest pressure   [] Palpitations   [] Shortness of breath when laying flat   [] Shortness of breath with exertion. Vascular:  [] Pain in legs with walking   [] Pain in legs at rest  [] History of DVT   [] Phlebitis   [] Swelling in legs   [] Varicose veins   [] Non-healing ulcers Pulmonary:   [] Uses home oxygen   [] Productive cough   [] Hemoptysis   [] Wheeze  [] COPD   [] Asthma Neurologic:  [] Dizziness   [] Seizures   [] History of stroke   [] History of TIA  [] Aphasia   [] Vissual changes   [] Weakness or numbness in arm   [] Weakness or  numbness in leg Musculoskeletal:   [] Joint swelling   [] Joint pain   [] Low back pain Hematologic:  [] Easy bruising  [] Easy bleeding   [] Hypercoagulable state   [] Anemic Gastrointestinal:  [] Diarrhea   [] Vomiting  [] Gastroesophageal reflux/heartburn   [] Difficulty swallowing. Genitourinary:  [] Chronic kidney disease   [] Difficult urination  [] Frequent urination   [] Blood in urine Skin:  [] Rashes   [] Ulcers  Psychological:  [] History of anxiety   []  History of major depression.  Physical Examination  Vitals:   06/12/17 0838  BP: 113/72  Pulse: (!) 5  Resp: 16  Weight: (!) 316 lb (143.3 kg)  Height: 5\' 9"  (1.753 m)   Body mass index is 46.67 kg/m. Gen: WD/WN, NAD Head: [place]/AT, No temporalis wasting.  Ear/Nose/Throat: Hearing grossly intact, nares w/o erythema or drainage Eyes: PER, EOMI, sclera nonicteric.  Neck: Supple, no large masses.   Pulmonary:  Good air movement, no audible wheezing bilaterally, no use of accessory muscles.  Cardiac: RRR, no JVD Vascular:  Vessel Right Left  Radial Palpable Palpable  PT Palpable Palpable  DP Palpable Palpable  Gastrointestinal: Non-distended. No guarding/no peritoneal signs.  Musculoskeletal: M/S 5/5 throughout.  No deformity or atrophy.  Neurologic: CN 2-12 intact. Symmetrical.  Speech is fluent. Motor exam as listed above. Psychiatric: Judgment intact, Mood & affect appropriate for pt's clinical situation. Dermatologic: No rashes or ulcers noted.  No changes consistent with cellulitis. Lymph : No lichenification or skin changes of chronic lymphedema.  CBC Lab Results  Component Value Date   WBC 11.6 (H) 01/23/2017   HGB 15.2 01/23/2017   HCT 46.1 01/23/2017   MCV 89.9 01/23/2017   PLT 260 01/23/2017    BMET    Component Value Date/Time   NA 136 01/23/2017 1103   K 3.7 01/23/2017 1103   CL 104 01/23/2017 1103   CO2 23 01/23/2017 1103   GLUCOSE 119 (H) 01/23/2017 1103   BUN 12 01/23/2017 1103   CREATININE 0.80 01/23/2017  1103   CALCIUM 8.8 (L) 01/23/2017 1103   GFRNONAA >60 01/23/2017 1103   GFRAA >60 01/23/2017 1103   CrCl cannot be calculated (Patient's most recent lab result is older than the maximum 21 days allowed.).  COAG Lab Results  Component Value Date   INR 1.05 11/18/2016   INR 1.23 10/23/2014    Radiology No results found.   Assessment/Plan 1. Atherosclerosis of aorta (HCC)  Recommend:  The patient has evidence of atherosclerosis of the lower extremities with claudication.  The patient does not voice lifestyle limiting changes at this point in time.  Noninvasive studies do not suggest clinically significant change.  No invasive studies, angiography or surgery at this time The patient should continue walking and begin a more formal exercise program.  The patient should continue antiplatelet therapy and aggressive treatment of the lipid abnormalities  No changes in the patient's medications at this time  The patient should continue wearing graduated compression socks 10-15 mmHg strength to control the mild edema.   - CT Angio Abd/Pel w/ and/or w/o; Future - CT Angio Chest W/Cm &/Or Wo Cm; Future  2. 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 Deep vein thrombosis (DVT) of popliteal vein, unspecified chronicity, unspecified laterality (HCC) Recommend:   No surgery or intervention at this point in time.  IVC filter is not indicated at present.  The patient is on anticoagulation   Elevation was stressed, use of a recliner was discussed.  I have had a long discussion with the patient regarding DVT and post phlebitic changes such as swelling and why it  causes symptoms such as pain.  The patient will wear graduated compression stockings class 1 (20-30 mmHg), beginning after three full days of anticoagulation, on a daily basis a prescription was given. The patient will  beginning wearing the stockings first thing in the morning and removing them in the  evening. The patient is instructed specifically not to sleep in the stockings.  In addition, behavioral modification including elevation during the day and avoidance of prolonged dependency will be initiated.    The patient will continue anticoagulation for now as there have not been any problems or complications at this point.    4. Mixed hyperlipidemia Continue statin as ordered and reviewed, no changes at this time     Levora Dredge, MD  06/14/2017 3:01 PM

## 2017-07-11 ENCOUNTER — Emergency Department: Payer: Self-pay

## 2017-07-11 ENCOUNTER — Emergency Department
Admission: EM | Admit: 2017-07-11 | Discharge: 2017-07-11 | Disposition: A | Discharge status: Home / Self Care | Payer: Self-pay | Attending: Emergency Medicine | Admitting: Emergency Medicine

## 2017-07-11 ENCOUNTER — Encounter: Payer: Self-pay | Admitting: Emergency Medicine

## 2017-07-11 ENCOUNTER — Other Ambulatory Visit: Payer: Self-pay

## 2017-07-11 DIAGNOSIS — R1012 Left upper quadrant pain: Secondary | ICD-10-CM | POA: Insufficient documentation

## 2017-07-11 DIAGNOSIS — R109 Unspecified abdominal pain: Secondary | ICD-10-CM

## 2017-07-11 DIAGNOSIS — F17200 Nicotine dependence, unspecified, uncomplicated: Secondary | ICD-10-CM | POA: Insufficient documentation

## 2017-07-11 DIAGNOSIS — Z7901 Long term (current) use of anticoagulants: Secondary | ICD-10-CM | POA: Insufficient documentation

## 2017-07-11 DIAGNOSIS — Z79899 Other long term (current) drug therapy: Secondary | ICD-10-CM | POA: Insufficient documentation

## 2017-07-11 DIAGNOSIS — Z7982 Long term (current) use of aspirin: Secondary | ICD-10-CM | POA: Insufficient documentation

## 2017-07-11 LAB — URINALYSIS, COMPLETE (UACMP) WITH MICROSCOPIC
Bacteria, UA: NONE SEEN
Bilirubin Urine: NEGATIVE
Glucose, UA: NEGATIVE mg/dL
Ketones, ur: NEGATIVE mg/dL
Leukocytes, UA: NEGATIVE
Nitrite: NEGATIVE
PH: 5 (ref 5.0–8.0)
Protein, ur: NEGATIVE mg/dL
RBC / HPF: >50 RBC/hpf — ABNORMAL HIGH (ref 0–5)
SPECIFIC GRAVITY, URINE: 1.024 (ref 1.005–1.030)
SQUAMOUS EPITHELIAL / LPF: NONE SEEN (ref 0–5)

## 2017-07-11 LAB — CBC
HCT: 45.1 % (ref 40.0–52.0)
Hemoglobin: 15.4 g/dL (ref 13.0–18.0)
MCH: 30 pg (ref 26.0–34.0)
MCHC: 34.1 g/dL (ref 32.0–36.0)
MCV: 88 fL (ref 80.0–100.0)
PLATELETS: 274 10*3/uL (ref 150–440)
RBC: 5.13 MIL/uL (ref 4.40–5.90)
RDW: 13.4 % (ref 11.5–14.5)
WBC: 7.8 10*3/uL (ref 3.8–10.6)

## 2017-07-11 LAB — COMPREHENSIVE METABOLIC PANEL
ALK PHOS: 48 U/L (ref 38–126)
ALT: 87 U/L — AB (ref 17–63)
AST: 57 U/L — AB (ref 15–41)
Albumin: 4.3 g/dL (ref 3.5–5.0)
Anion gap: 7 (ref 5–15)
BUN: 13 mg/dL (ref 6–20)
CALCIUM: 8.8 mg/dL — AB (ref 8.9–10.3)
CHLORIDE: 102 mmol/L (ref 101–111)
CO2: 27 mmol/L (ref 22–32)
CREATININE: 0.89 mg/dL (ref 0.61–1.24)
GFR calc Af Amer: >60 mL/min (ref >60)
Glucose, Bld: 109 mg/dL — ABNORMAL HIGH (ref 65–99)
Potassium: 3.7 mmol/L (ref 3.5–5.1)
SODIUM: 136 mmol/L (ref 135–145)
Total Bilirubin: 0.6 mg/dL (ref 0.3–1.2)
Total Protein: 7.3 g/dL (ref 6.5–8.1)

## 2017-07-11 LAB — LIPASE, BLOOD: LIPASE: 23 U/L (ref 11–51)

## 2017-07-11 MED ORDER — SUCRALFATE 1 G PO TABS: 1.0000 g | ORAL_TABLET | Freq: Four times a day (QID) | ORAL | 0 refills | Status: DC

## 2017-07-11 MED ORDER — IOPAMIDOL (ISOVUE-370) INJECTION 76%
125.0000 mL | Freq: Once | INTRAVENOUS | Status: AC | PRN
  Administered 2017-07-11: 125 mL via INTRAVENOUS
  Filled 2017-07-11: qty 125

## 2017-07-11 MED ORDER — SODIUM CHLORIDE 0.9 % IV BOLUS
1000.0000 mL | Freq: Once | INTRAVENOUS | Status: AC
  Administered 2017-07-11: 1000 mL via INTRAVENOUS

## 2017-07-11 NOTE — ED Notes (Signed)
Patient transported to CT 

## 2017-07-11 NOTE — ED Provider Notes (Signed)
[facility] Emergency Department Provider Note  ___________________________________________   First MD Initiated Contact with Patient 07/11/17 1401     (approximate)  I have reviewed the triage vital signs and the nursing notes.   HISTORY  Chief Complaint Abdominal Pain and Back Pain   HPI Thomas Hopkins is a 32 y.o. 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  He says that he is no longer on Eliquis.  States that the pain woke him from sleep yesterday morning and was sharp and stabbing and lasted several hours.  He is denying any pain at this time.  Says the pain is worse with eating and he feels a "bloating" sensation after eating.  Denies any nausea vomiting or diarrhea.  Says that he is compliant with his Plavix and aspirin.  Denies any smoking, drinking or drug use.  Denies any pain at this time.  Says that he also has ongoing chronic low back pain that is worse with moving and bending forward.  He says that he occasionally takes oxycodone for this and uses a Biofreeze.  Denies any blood in his urine.   Past Medical History:  Diagnosis Date  . DVT of lower extremity, bilateral (HCC)    stopped Eliquis in 2017    Patient Active Problem List   Diagnosis Date Noted  . 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 Hyperlipidemia 03/19/2017  . Atherosclerosis of aorta (HCC) 01/27/2017  . Ischemic foot pain at rest [facility]) 11/18/2016  . Acute venous embolism and thrombosis of deep vessels of distal lower extremity (HCC) 10/22/2014  . Deep venous thrombosis (HCC) 10/22/2014  . RLQ abdominal pain   . Abdominal pain, acute, right lower quadrant 10/13/2014    Past Surgical History:  Procedure Laterality Date  . ABDOMINAL AORTOGRAM W/LOWER EXTREMITY Right 11/18/2016   Procedure: ABDOMINAL AORTOGRAM W/LOWER EXTREMITY;   Surgeon: Renford Dills, MD;  Location: [facility] INVASIVE CV LAB;  Service: Cardiovascular;  Laterality: Right;  . LAPAROSCOPIC APPENDECTOMY N/A 10/14/2014   Procedure: APPENDECTOMY LAPAROSCOPIC;  Surgeon: Lattie Haw, MD;  Location: [facility] ORS;  Service: General;  Laterality: N/A;  . TONSILLECTOMY      Prior to Admission medications   Medication Sig Start Date End Date Taking? Authorizing Provider  acetaminophen (TYLENOL) 500 MG tablet Take 500 mg by mouth every 6 (six) hours as needed.    [provider]  apixaban (ELIQUIS) 5 MG TABS tablet 2 five mg tablets twice a day ( ) for a week, then 1 five mg tablet twice a day for three months 10/23/14   [provider]  aspirin EC 325 MG tablet Take 1 tablet (325 mg total) by mouth daily. 11/19/16 11/19/17  Schnier, Latina Craver, MD  Aspirin-Acetaminophen-Caffeine [phone] MG PACK Take by mouth.    [provider]  atorvastatin (LIPITOR) 20 MG tablet Take 1 tablet (20 mg total) by mouth daily at 6 PM. 11/19/16   Schnier, Latina Craver, MD  cephALEXin (KEFLEX) 500 MG capsule Take 1 capsule (500 mg total) by mouth 3 (three) times daily. Patient not taking: Reported on 05/11/2017 04/15/17   Faythe Ghee, PA-C  clopidogrel (PLAVIX) 75 MG tablet Take 1 tablet (75 mg total) by mouth daily. 11/20/16   Schnier, Latina Craver, MD  ibuprofen (ADVIL,MOTRIN) 600 MG tablet Take 1 tablet (600 mg total) by mouth every 6 (  six) hours as needed. Patient not taking: Reported on 05/11/2017 03/23/17   Joni Reining, PA-C  predniSONE (DELTASONE) 10 MG tablet Take 3 tablets (30 mg total) by mouth daily with breakfast. Patient not taking: Reported on 05/11/2017 04/15/17   Faythe Ghee, PA-C  ranitidine (ZANTAC) 150 MG tablet Take 150 mg by mouth 2 (two) times daily. 04/25/17   [provider]    Allergies Bee venom; Vicodin [hydrocodone-acetaminophen]; Hydrocodone-acetaminophen; Mushroom extract complex; and Olive oil  Family History  Problem  Relation Age of Onset  . Diabetes Mother   . Diabetes Mellitus II Maternal Grandmother   . Rectal cancer Father     Social History Social History   Tobacco Use  . Smoking status: Current Some Day Smoker    Packs/day: 1.00    Years: 15.00    Pack years: 15.00  . Smokeless tobacco: Never Used  Substance Use Topics  . Alcohol use: Yes    Comment: rare consumption  . Drug use: No    Review of Systems  Constitutional: No fever/chills Eyes: No visual changes. ENT: No sore throat. Cardiovascular: Denies chest pain. Respiratory: Denies shortness of breath. Gastrointestinal: No nausea, no vomiting.  No diarrhea.  No constipation. Genitourinary: Negative for dysuria. Musculoskeletal: As above Skin: Negative for rash. Neurological: Negative for headaches, focal weakness or numbness.   ____________________________________________   PHYSICAL EXAM:  VITAL SIGNS: ED Triage Vitals [07/11/17 1126]  Enc Vitals Group     BP 136/76     Pulse Rate 68     Resp 16     Temp 98.6 F (37 C)     Temp Source Oral     SpO2 96 %     Weight (!) 316 lb (143.3 kg)     Height      Head Circumference      Peak Flow      Pain Score 0     Pain Loc      Pain Edu?      Excl. in GC?     Constitutional: Alert and oriented. Well appearing and in no acute distress. Eyes: Conjunctivae are normal.  Head: Atraumatic. Nose: No congestion/rhinnorhea. Mouth/Throat: Mucous membranes are moist.  Neck: No stridor.   Cardiovascular: Normal rate, regular rhythm. Grossly normal heart sounds.   Respiratory: Normal respiratory effort.  No retractions. Lungs CTAB. Gastrointestinal: Soft with minimal left upper quadrant abdominal tenderness.  No rebound or guarding.  No distention. No CVA tenderness. Musculoskeletal: No lower extremity tenderness nor edema.  No joint effusions. Neurologic:  Normal speech and language. No gross focal neurologic deficits are appreciated. Skin:  Skin is warm, dry and  intact. No rash noted. Psychiatric: Mood and affect are normal. Speech and behavior are normal.  ____________________________________________   LABS (all labs ordered are listed, but only abnormal results are displayed)  Labs Reviewed  COMPREHENSIVE METABOLIC PANEL - Abnormal; Notable for the following components:      Result Value   Glucose, Bld 109 (*)    Calcium 8.8 (*)    AST 57 (*)    ALT 87 (*)    All other components within normal limits  URINALYSIS, COMPLETE (UACMP) WITH MICROSCOPIC - Abnormal; Notable for the following components:   Color, Urine YELLOW (*)    APPearance HAZY (*)    Hgb urine dipstick LARGE (*)    RBC / HPF >50 (*)    All other components within normal limits  LIPASE, BLOOD  CBC   ____________________________________________  EKG   ____________________________________________  RADIOLOGY  No acute finding on the noncontrast renal CT  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  No other acute finding. ____________________________________________   PROCEDURES  Procedure(s) performed:   Procedures  Critical Care performed:   ____________________________________________   INITIAL IMPRESSION / ASSESSMENT AND PLAN / ED COURSE  Pertinent labs & imaging results that were available during my care of the patient were reviewed by me and considered in my medical decision making (see chart for details).  Differential diagnosis includes, but is not limited to, biliary disease (biliary colic, acute cholecystitis, cholangitis, choledocholithiasis, etc), intrathoracic causes for epigastric abdominal pain including ACS, gastritis, duodenitis, pancreatitis, small bowel or large bowel obstruction, abdominal aortic aneurysm, hernia, and gastritis. As part of my medical decision making, I reviewed the following data within the electronic MEDICAL RECORD NUMBER Notes from prior ED visits  ----------------------------------------- 5:22 PM on  07/11/2017 -----------------------------------------  Patient with reassuring CT angiography without emboli.  Stable mural thrombus.  Patient will be discharged at this time.  We will give sucralfate as well as diclofenac gel for the back pain.  I will also give GI follow-up.  The patient is understanding of the diagnosis.  We reviewed the CT scans.  He is understanding of the treatment plan willing to comply.  Low back pain likely musculoskeletal.  No signs of cauda equina.  ____________________________________________   FINAL CLINICAL IMPRESSION(S) / ED DIAGNOSES  Left-sided abdominal flank pain.  Low back pain.    NEW MEDICATIONS STARTED DURING THIS VISIT:  New Prescriptions   No medications on file     Note:  This document was prepared using Dragon voice recognition software and may include unintentional dictation errors.     Myrna Blazer, MD 07/11/17 [phone]

## 2017-07-11 NOTE — ED Triage Notes (Signed)
Pt to ED via POV c/o abdominal and back pain for "a while". Pt states that pain has been worse since yesterday. Pt is in NAD at this time.

## 2017-07-28 ENCOUNTER — Other Ambulatory Visit (INDEPENDENT_AMBULATORY_CARE_PROVIDER_SITE_OTHER): Payer: Self-pay | Admitting: Vascular Surgery

## 2017-08-03 ENCOUNTER — Telehealth (INDEPENDENT_AMBULATORY_CARE_PROVIDER_SITE_OTHER): Payer: Self-pay | Admitting: Vascular Surgery

## 2017-08-03 NOTE — Telephone Encounter (Signed)
Patient's wife stated that the patient has been seen for his recent stomach pains but they are only treating him for acid reflux at this time. She says that his stomach pain seems to be getting worse, and she wonders if it's coming from the Aspirin? She was advised to contact the PCP to see if further testing is needed.  The patient was advised that Dr. Gilda Crease says that he does need to be taking both blood thinners.

## 2017-08-06 ENCOUNTER — Encounter: Payer: Self-pay | Admitting: Emergency Medicine

## 2017-08-06 ENCOUNTER — Emergency Department
Admission: EM | Admit: 2017-08-06 | Discharge: 2017-08-06 | Disposition: A | Discharge status: Home / Self Care | Payer: Self-pay | Attending: Emergency Medicine | Admitting: Emergency Medicine

## 2017-08-06 DIAGNOSIS — Z79899 Other long term (current) drug therapy: Secondary | ICD-10-CM | POA: Insufficient documentation

## 2017-08-06 DIAGNOSIS — Z7982 Long term (current) use of aspirin: Secondary | ICD-10-CM | POA: Insufficient documentation

## 2017-08-06 DIAGNOSIS — R51 Headache: Secondary | ICD-10-CM | POA: Insufficient documentation

## 2017-08-06 DIAGNOSIS — Z87891 Personal history of nicotine dependence: Secondary | ICD-10-CM | POA: Insufficient documentation

## 2017-08-06 DIAGNOSIS — Z7901 Long term (current) use of anticoagulants: Secondary | ICD-10-CM | POA: Insufficient documentation

## 2017-08-06 DIAGNOSIS — R519 Headache, unspecified: Secondary | ICD-10-CM

## 2017-08-06 DIAGNOSIS — Z7902 Long term (current) use of antithrombotics/antiplatelets: Secondary | ICD-10-CM | POA: Insufficient documentation

## 2017-08-06 DIAGNOSIS — K029 Dental caries, unspecified: Secondary | ICD-10-CM | POA: Insufficient documentation

## 2017-08-06 MED ORDER — DEXAMETHASONE SODIUM PHOSPHATE 10 MG/ML IJ SOLN
10.0000 mg | Freq: Once | INTRAMUSCULAR | Status: AC
  Administered 2017-08-06: 10 mg via INTRAMUSCULAR
  Filled 2017-08-06: qty 1

## 2017-08-06 MED ORDER — LIDOCAINE-EPINEPHRINE 2 %-1:100000 IJ SOLN
1.7000 mL | Freq: Once | INTRAMUSCULAR | Status: AC
  Administered 2017-08-06: 1.7 mL
  Filled 2017-08-06: qty 1.7

## 2017-08-06 MED ORDER — AMOXICILLIN 500 MG PO CAPS
500.0000 mg | ORAL_CAPSULE | Freq: Once | ORAL | Status: AC
  Administered 2017-08-06: 500 mg via ORAL
  Filled 2017-08-06: qty 1

## 2017-08-06 MED ORDER — DEXAMETHASONE SODIUM PHOSPHATE 10 MG/ML IJ SOLN
INTRAMUSCULAR | Status: AC
  Filled 2017-08-06: qty 1

## 2017-08-06 MED ORDER — CYCLOBENZAPRINE HCL 10 MG PO TABS
10.0000 mg | ORAL_TABLET | Freq: Once | ORAL | Status: AC
Start: 2017-08-06 — End: 2017-08-06
  Administered 2017-08-06: 10 mg via ORAL
  Filled 2017-08-06: qty 1

## 2017-08-06 MED ORDER — CYCLOBENZAPRINE HCL 5 MG PO TABS: 5.0000 mg | ORAL_TABLET | Freq: Three times a day (TID) | ORAL | 0 refills | Status: DC | PRN

## 2017-08-06 MED ORDER — LIDOCAINE VISCOUS HCL 2 % MT SOLN
OROMUCOSAL | Status: AC
  Filled 2017-08-06: qty 15

## 2017-08-06 MED ORDER — AMOXICILLIN 500 MG PO CAPS: 500.0000 mg | ORAL_CAPSULE | Freq: Three times a day (TID) | ORAL | 0 refills | Status: DC

## 2017-08-06 MED ORDER — LIDOCAINE VISCOUS HCL 2 % MT SOLN
15.0000 mL | Freq: Once | OROMUCOSAL | Status: AC
  Administered 2017-08-06: 15 mL via OROMUCOSAL
  Filled 2017-08-06: qty 15

## 2017-08-06 NOTE — Discharge Instructions (Addendum)
You may be experiencing some increased headache from your dental caries. Take the antibiotic as directed. Follow-up with a local dental provider for dental extraction and routine dental care.   OPTIONS FOR DENTAL FOLLOW UP CARE  [place] [facility] and [facility] - Local Safety Net Dental Clinics [email or web address]   [facility] [phone])  [facility] Ma [phone])  [place] [phone])  [facility] [phone])  [facility] [phone]) This clinic caters to the indigent population and is on a lottery system. Location: [employer] of Dentistry, [employer], [address] [place] Clinic Hours: Wednesdays from 6pm - 9pm, patients seen by a lottery system. For dates, call or go to [email or web address] Services: Cleanings, fillings and simple extractions. Payment Options: DENTAL WORK IS FREE OF CHARGE. Bring proof of income or support. Best way to get seen: Arrive at 5:15 pm - this is a lottery, NOT first come/first serve, so arriving earlier will not increase your chances of being seen.     [facility] [phone] Select option 1 for emergencies   Location: [facility] of Dentistry, [place], [address], [place] Clinic Hours: No walk-ins accepted - call the day before to schedule an appointment. Check in times are 9:30 am and 1:30 pm. Services: Simple extractions, temporary fillings, pulpectomy/pulp debridement, uncomplicated abscess drainage. Payment Options: PAYMENT IS DUE AT THE TIME OF SERVICE.  Fee is usually $100-200, additional surgical procedures (e.g. abscess drainage) may be extra. Cash, checks, Visa/MasterCard accepted.  Can file Medicaid if patient is covered for dental - patient should call case worker to check. No discount for [facility]  patients. Best way to get seen: MUST call the day before and get onto the schedule. Can usually be seen the next 1-2 days. No walk-ins accepted.     [facility] [phone]   Location: [facility], [address], [place] Clinic Hours: M, W, Th, F 8am or 1:30pm, Tues 9a or 1:30 - first come/first served. Services: Simple extractions, temporary fillings, uncomplicated abscess drainage.  You do not need to be an [facility] resident. Payment Options: PAYMENT IS DUE AT THE TIME OF SERVICE. Dental insurance, otherwise sliding scale - bring proof of income or support. Depending on income and treatment needed, cost is usually $50-200. Best way to get seen: Arrive early as it is first come/first served.     [facility] [phone]   Location: [address] Clinic Hours: Mon-Thu 8a-5p Services: Most basic dental services including extractions and fillings. Payment Options: PAYMENT IS DUE AT THE TIME OF SERVICE. Sliding scale, up to 50% off - bring proof if income or support. Medicaid with dental option accepted. Best way to get seen: Call to schedule an appointment, can usually be seen within 2 weeks OR they will try to see walk-ins - show up at 8a or 2p (you may have to wait).     [facility] [phone] [place] RESIDENTS ONLY   Location: [facility] [address], [place], [place] [address] Clinic Hours: By appointment only. Monday - Thursday 8am-5pm, Friday 8am-12pm Services: Cleanings, fillings, extractions. Payment Options: PAYMENT IS DUE AT THE TIME OF SERVICE. Cash, Visa or MasterCard. Sliding scale - $30 minimum per service. Best way to get seen: Come in to office, complete packet and make an appointment - need proof of income or support monies for each household  member and proof of [facility] residence. Usually takes about a month to  get in.     [facility] [phone]   Location: [address] [facility] Clinic Hours: Walk-in Urgent Care Dental Services are offered Monday-Friday mornings only. The numbers of emergencies accepted daily is limited to the number of providers available. Maximum 15 - Mondays, Wednesdays & Thursdays Maximum 10 - Tuesdays & Fridays Services: You do not need to be a [facility] resident to be seen for a dental emergency. Emergencies are defined as pain, swelling, abnormal bleeding, or dental trauma. Walkins will receive x-rays if needed. NOTE: Dental cleaning is not an emergency. Payment Options: PAYMENT IS DUE AT THE TIME OF SERVICE. Minimum co-pay is $40.00 for uninsured patients. Minimum co-pay is $3.00 for Medicaid with dental coverage. Dental Insurance is accepted and must be presented at time of visit. Medicare does not cover dental. Forms of payment: Cash, credit card, checks. Best way to get seen: If not previously registered with the clinic, walk-in dental registration begins at 7:15 am and is on a first come/first serve basis. If previously registered with the clinic, call to make an appointment.     [facility] [phone] [place] RESIDENTS ONLY   Location: [address], [place], [place] Clinic Hours: Mon-Thu 10a-2p Services: Extractions only! Payment Options: FREE (donations accepted) - bring proof of income or support Best way to get seen: Call and schedule an appointment OR come at 8am on the 1st Monday of every month (except for holidays) when it is first come/first served.     [facility] [phone]   Location: [address] [place], [place] Clinic Hours: Friday mornings Services, Payment Options, Best way to get seen: Call for info

## 2017-08-06 NOTE — ED Triage Notes (Signed)
Patient with complaint of right upper dental pain times two days that has now caused a headache.

## 2017-08-07 NOTE — ED Provider Notes (Signed)
[facility] Emergency Department Provider Note ____________________________________________  Time seen: 2219  I have reviewed the triage vital signs and the nursing notes.  HISTORY  Chief Complaint  Dental Pain and Headache  HPI ROSHAWN AYALA is a 32 y.o. male presents to the ED accompanied by his mother, for evaluation of dental pain.  Patient with chronically decayed front upper teeth, reports dental pain above the right primary incisor.  He reports increased pain over the last 2 days.  He reports that the dental pain is caused some headache.  He describes a headache at times been as severe as a 10 out of 10 in nature.  He denies any nausea, vomiting, syncope, or vertigo.  He has taken Tylenol with limited benefit.  He presents now denying any fevers, chills, sweats, or spontaneous purulent drainage.  Patient was concerned with the intensity of his headaches citing his recent history of a DVT.  He denies any visual change, chest pain, or shortness of breath.  Past Medical History:  Diagnosis Date  . DVT of lower extremity, bilateral (HCC)    stopped Eliquis in 2017    Patient Active Problem List   Diagnosis Date Noted  . 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 Hyperlipidemia 03/19/2017  . Atherosclerosis of aorta (HCC) 01/27/2017  . Ischemic foot pain at rest [facility]) 11/18/2016  . Acute venous embolism and thrombosis of deep vessels of distal lower extremity (HCC) 10/22/2014  . Deep venous thrombosis (HCC) 10/22/2014  . RLQ abdominal pain   . Abdominal pain, acute, right lower quadrant 10/13/2014    Past Surgical History:  Procedure Laterality Date  . ABDOMINAL AORTOGRAM W/LOWER EXTREMITY Right 11/18/2016   Procedure: ABDOMINAL AORTOGRAM W/LOWER EXTREMITY;  Surgeon: Renford Dills, MD;  Location: [facility] INVASIVE CV LAB;  Service: Cardiovascular;  Laterality: Right;  . LAPAROSCOPIC APPENDECTOMY N/A 10/14/2014   Procedure:  APPENDECTOMY LAPAROSCOPIC;  Surgeon: Lattie Haw, MD;  Location: [facility] ORS;  Service: General;  Laterality: N/A;  . TONSILLECTOMY      Prior to Admission medications   Medication Sig Start Date End Date Taking? Authorizing Provider  acetaminophen (TYLENOL) 500 MG tablet Take 500 mg by mouth every 6 (six) hours as needed.    [provider]  amoxicillin (AMOXIL) 500 MG capsule Take 1 capsule (500 mg total) by mouth 3 (three) times daily. 08/06/17   Andreea Arca, Charlesetta Ivory, PA-C  apixaban (ELIQUIS) 5 MG TABS tablet 2 five mg tablets twice a day ( ) for a week, then 1 five mg tablet twice a day for three months 10/23/14   [provider]  aspirin EC 325 MG tablet Take 1 tablet (325 mg total) by mouth daily. 11/19/16 11/19/17  Schnier, Latina Craver, MD  Aspirin-Acetaminophen-Caffeine [id number] MG PACK Take by mouth.    [provider]  atorvastatin (LIPITOR) 20 MG tablet Take 1 tablet (20 mg total) by mouth daily at 6 PM. 11/19/16   Schnier, Latina Craver, MD  cephALEXin (KEFLEX) 500 MG capsule Take 1 capsule (500 mg total) by mouth 3 (three) times daily. Patient not taking: Reported on 05/11/2017 04/15/17   Faythe Ghee, PA-C  clopidogrel (PLAVIX) 75 MG tablet Take 1 tablet (75 mg total) by mouth daily. 11/20/16   Schnier, Latina Craver, MD  cyclobenzaprine (FLEXERIL) 5 MG tablet Take 1 tablet (5 mg total) by mouth 3 (three) times daily as needed for muscle spasms. 08/06/17   Delsin Copen, Charlesetta Ivory, PA-C  ibuprofen (  ADVIL,MOTRIN) 600 MG tablet Take 1 tablet (600 mg total) by mouth every 6 (six) hours as needed. Patient not taking: Reported on 05/11/2017 03/23/17   Joni Reining, PA-C  predniSONE (DELTASONE) 10 MG tablet Take 3 tablets (30 mg total) by mouth daily with breakfast. Patient not taking: Reported on 05/11/2017 04/15/17   Faythe Ghee, PA-C  ranitidine (ZANTAC) 150 MG tablet Take 150 mg by mouth 2 (two) times daily. 04/25/17   [provider]  sucralfate (CARAFATE) 1  g tablet Take 1 tablet (1 g total) by mouth 4 (four) times daily. 07/11/17 07/11/18  Schaevitz, Myra Rude, MD    Allergies Bee venom; Vicodin [hydrocodone-acetaminophen]; Hydrocodone-acetaminophen; Mushroom extract complex; and Olive oil  Family History  Problem Relation Age of Onset  . Diabetes Mother   . Diabetes Mellitus II Maternal Grandmother   . Rectal cancer Father     Social History Social History   Tobacco Use  . Smoking status: Former Smoker    Packs/day: 1.00    Years: 15.00    Pack years: 15.00  . Smokeless tobacco: Never Used  Substance Use Topics  . Alcohol use: Not Currently  . Drug use: No    Review of Systems  Constitutional: Negative for fever. Eyes: Negative for visual changes. ENT: Negative for sore throat. Reports dental pain as above Cardiovascular: Negative for chest pain. Respiratory: Negative for shortness of breath. Gastrointestinal: Negative for abdominal pain, vomiting and diarrhea. Neurological: Negative for focal weakness or numbness.  Reports some headache ____________________________________________  PHYSICAL EXAM:  VITAL SIGNS: ED Triage Vitals [08/06/17 2117]  Enc Vitals Group     BP 134/67     Pulse Rate (!) 56     Resp 18     Temp 98.4 F (36.9 C)     Temp Source Oral     SpO2 95 %     Weight (!) 316 lb (143.3 kg)     Height  (1.753 m)     Head Circumference      Peak Flow      Pain Score 10     Pain Loc      Pain Edu?      Excl. in GC?     Constitutional: Alert and oriented. Well appearing and in no distress. Head: Normocephalic and atraumatic. Mouth/Throat: Mucous membranes are moist.  Patient with chronically decayed primary and secondary incisor to the upper jaw.  Teeth are decayed to the dentin and nearly to the gumline.  No focal gum edema, erythema, or pointing is noted.  Uvula is midline and tonsils are flat.  No sublingual edema or erythema is appreciated. Neck: Supple. No  thyromegaly. Hematological/Lymphatic/Immunological: No cervical lymphadenopathy. Cardiovascular: Normal rate, regular rhythm. Normal distal pulses. Respiratory: Normal respiratory effort. No wheezes/rales/rhonchi. Musculoskeletal: Nontender with normal range of motion in all extremities.  Neurologic:  Normal gait without ataxia. Normal speech and language. No gross focal neurologic deficits are appreciated. Skin:  Skin is warm, dry and intact. No rash noted. Psychiatric: Mood and affect are normal. Patient exhibits appropriate insight and judgment. ____________________________________________  PROCEDURES  Flexeril 10 mg PO amoxicillin 500 mg PO Decadron 10 mg IM Viscous lido 2% topically  DENTAL BLOCK  Performed by: Lissa Hoard Consent: Verbal consent obtained. Required items: devices and special equipment available Time out: Immediately prior to procedure a "time out" was called to verify the correct patient, procedure, equipment, support staff and site/side marked as required.  Indication: pain Nerve block  body site: upper right secondary incisor  Preparation: Patient was prepped and draped in the usual sterile fashion. Needle gauge: 27 G Location technique: anatomical landmarks  Local anesthetic: lido-epi 2%-1:100000  Anesthetic total: 1.5 ml  Outcome: pain improved Patient tolerance: Patient tolerated the procedure well with no immediate complications. He reports no local anesthesia and continued to report pain to the gum ____________________________________________  INITIAL IMPRESSION / ASSESSMENT AND PLAN / ED COURSE  Patient with ED evaluation of acute dental pain secondary to chronic decay and caries.  Patient's exam is overall benign.  Neurologically intact without any signs of neuromuscular deficit.  Patient tolerated a dental block procedure but ultimately had resolution of his dental pain with topical lidocaine applied to the gum.  He will be  discharged with a prescription for amoxicillin as well as flexeril for headache pain.  He will continue to dose his home medications as needed.  He is given a dental provider list for definitive management.  Return precautions have been reviewed. ____________________________________________  FINAL CLINICAL IMPRESSION(S) / ED DIAGNOSES  Final diagnoses:  Pain due to dental caries  Bad headache      Endya Austin, Charlesetta Ivory, PA-C 08/07/17 4098    Minna Antis, MD 08/07/17 2332

## 2017-08-20 ENCOUNTER — Other Ambulatory Visit: Payer: Self-pay

## 2017-08-20 ENCOUNTER — Emergency Department: Payer: Self-pay

## 2017-08-20 ENCOUNTER — Emergency Department
Admission: EM | Admit: 2017-08-20 | Discharge: 2017-08-20 | Disposition: A | Discharge status: Home / Self Care | Payer: Self-pay | Attending: Emergency Medicine | Admitting: Emergency Medicine

## 2017-08-20 ENCOUNTER — Encounter: Payer: Self-pay | Admitting: Emergency Medicine

## 2017-08-20 DIAGNOSIS — Z7902 Long term (current) use of antithrombotics/antiplatelets: Secondary | ICD-10-CM | POA: Insufficient documentation

## 2017-08-20 DIAGNOSIS — R12 Heartburn: Secondary | ICD-10-CM | POA: Insufficient documentation

## 2017-08-20 DIAGNOSIS — Z79899 Other long term (current) drug therapy: Secondary | ICD-10-CM | POA: Insufficient documentation

## 2017-08-20 DIAGNOSIS — Z87891 Personal history of nicotine dependence: Secondary | ICD-10-CM | POA: Insufficient documentation

## 2017-08-20 DIAGNOSIS — Z7982 Long term (current) use of aspirin: Secondary | ICD-10-CM | POA: Insufficient documentation

## 2017-08-20 LAB — CBC
HCT: 43.2 % (ref 40.0–52.0)
Hemoglobin: 14.6 g/dL (ref 13.0–18.0)
MCH: 29.7 pg (ref 26.0–34.0)
MCHC: 33.8 g/dL (ref 32.0–36.0)
MCV: 87.9 fL (ref 80.0–100.0)
PLATELETS: 270 10*3/uL (ref 150–440)
RBC: 4.92 MIL/uL (ref 4.40–5.90)
RDW: 13.4 % (ref 11.5–14.5)
WBC: 10.4 10*3/uL (ref 3.8–10.6)

## 2017-08-20 LAB — BASIC METABOLIC PANEL
Anion gap: 9 (ref 5–15)
BUN: 14 mg/dL (ref 6–20)
CALCIUM: 9 mg/dL (ref 8.9–10.3)
CHLORIDE: 104 mmol/L (ref 101–111)
CO2: 26 mmol/L (ref 22–32)
CREATININE: 0.73 mg/dL (ref 0.61–1.24)
GFR calc Af Amer: >60 mL/min (ref >60)
GFR calc non Af Amer: >60 mL/min (ref >60)
Glucose, Bld: 98 mg/dL (ref 65–99)
Potassium: 3.8 mmol/L (ref 3.5–5.1)
SODIUM: 139 mmol/L (ref 135–145)

## 2017-08-20 LAB — TROPONIN I
Troponin I: <0.03 ng/mL (ref <0.03)
Troponin I: <0.03 ng/mL (ref <0.03)

## 2017-08-20 MED ORDER — GI COCKTAIL ~~LOC~~
30.0000 mL | Freq: Once | ORAL | Status: AC
  Administered 2017-08-20: 30 mL via ORAL

## 2017-08-20 MED ORDER — PANTOPRAZOLE SODIUM 40 MG PO TBEC: 40.0000 mg | DELAYED_RELEASE_TABLET | Freq: Every day | ORAL | 1 refills | Status: DC

## 2017-08-20 MED ORDER — PANTOPRAZOLE SODIUM 40 MG PO TBEC
40.0000 mg | DELAYED_RELEASE_TABLET | Freq: Once | ORAL | Status: AC
  Administered 2017-08-20: 40 mg via ORAL
  Filled 2017-08-20: qty 1

## 2017-08-20 MED ORDER — GI COCKTAIL ~~LOC~~
ORAL | Status: AC
  Administered 2017-08-20: 30 mL via ORAL
  Filled 2017-08-20: qty 30

## 2017-08-20 MED ORDER — GI COCKTAIL ~~LOC~~
30.0000 mL | Freq: Once | ORAL | Status: AC
  Administered 2017-08-20: 30 mL via ORAL
  Filled 2017-08-20: qty 30

## 2017-08-20 NOTE — ED Notes (Signed)
ED Provider at bedside. 

## 2017-08-20 NOTE — Discharge Instructions (Signed)
Take the Protonix 1 pill a day. Continue your amoxicillin, aspirin and Plavix. Do not take the ibuprofen any longer. if you have bad pain and need something you can try the Percocet 1 pill 4 times a day.Be careful that can make you constipated and woozy. Do not drive on it. do not take the Percocet with Tylenol. There is Tylenol in the Percocet and you can get too much Tylenol that way. Please call the cardiologist tomorrow morning before you get U tooth pulled and schedule a follow-up appointment. I am doing that just to be safe since you have had or blood clots in the past.

## 2017-08-20 NOTE — ED Provider Notes (Signed)
[facility] Emergency Department Provider Note   ____________________________________________   First MD Initiated Contact with Patient 08/20/17 1419     (approximate)  I have reviewed the triage vital signs and the nursing notes.   HISTORY  Chief Complaint No chief complaint on file.   HPI Thomas Hopkins is a 32 y.o. male Who went to the dentist and got amoxicillin and ibuprofen for toothache. He started taking aspirin Plavix andpelvic was. He said the first time he took the medicine last night he was fine but this morning about a half an hour after he took the medicine he began having burning his left lower chest. It waxes and wanes but has been present for 7 hours without completely resolving. He's also had some mild low back pain and a little headache.he has not eaten since he developed the pain. Exercise does not make any of the pain worse. Does not make it better either.   Past Medical History:  Diagnosis Date  . DVT of lower extremity, bilateral (HCC)    stopped Eliquis in 2017    Patient Active Problem List   Diagnosis Date Noted  . 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 Hyperlipidemia 03/19/2017  . Atherosclerosis of aorta (HCC) 01/27/2017  . Ischemic foot pain at rest [facility]) 11/18/2016  . Acute venous embolism and thrombosis of deep vessels of distal lower extremity (HCC) 10/22/2014  . Deep venous thrombosis (HCC) 10/22/2014  . RLQ abdominal pain   . Abdominal pain, acute, right lower quadrant 10/13/2014    Past Surgical History:  Procedure Laterality Date  . ABDOMINAL AORTOGRAM W/LOWER EXTREMITY Right 11/18/2016   Procedure: ABDOMINAL AORTOGRAM W/LOWER EXTREMITY;  Surgeon: Renford Dills, MD;  Location: [facility] INVASIVE CV LAB;  Service: Cardiovascular;  Laterality: Right;  . LAPAROSCOPIC APPENDECTOMY N/A 10/14/2014   Procedure: APPENDECTOMY LAPAROSCOPIC;  Surgeon: Lattie Haw, MD;  Location: [facility]  ORS;  Service: General;  Laterality: N/A;  . TONSILLECTOMY      Prior to Admission medications   Medication Sig Start Date End Date Taking? Authorizing Provider  acetaminophen (TYLENOL) 500 MG tablet Take 500 mg by mouth every 6 (six) hours as needed.    [provider]  amoxicillin (AMOXIL) 500 MG capsule Take 1 capsule (500 mg total) by mouth 3 (three) times daily. 08/06/17   Menshew, Charlesetta Ivory, PA-C  apixaban (ELIQUIS) 5 MG TABS tablet 2 five mg tablets twice a day (10mg ) for a week, then 1 five mg tablet twice a day for three months 10/23/14   [provider]  aspirin EC 325 MG tablet Take 1 tablet (325 mg total) by mouth daily. 11/19/16 11/19/17  Schnier, Latina Craver, MD  Aspirin-Acetaminophen-Caffeine [phone] MG PACK Take by mouth.    [provider]  atorvastatin (LIPITOR) 20 MG tablet Take 1 tablet (20 mg total) by mouth daily at 6 PM. 11/19/16   Schnier, Latina Craver, MD  cephALEXin (KEFLEX) 500 MG capsule Take 1 capsule (500 mg total) by mouth 3 (three) times daily. Patient not taking: Reported on 05/11/2017 04/15/17   Faythe Ghee, PA-C  clopidogrel (PLAVIX) 75 MG tablet Take 1 tablet (75 mg total) by mouth daily. 11/20/16   Schnier, Latina Craver, MD  cyclobenzaprine (FLEXERIL) 5 MG tablet Take 1 tablet (5 mg total) by mouth 3 (three) times daily as needed for muscle spasms. 08/06/17   Menshew, Charlesetta Ivory, PA-C  ibuprofen (ADVIL,MOTRIN) 600 MG tablet Take 1 tablet (  600 mg total) by mouth every 6 (six) hours as needed. Patient not taking: Reported on 05/11/2017 03/23/17   Joni Reining, PA-C  pantoprazole (PROTONIX) 40 MG tablet Take 1 tablet (40 mg total) by mouth daily. 08/20/17 08/20/18  Arnaldo Natal, MD  predniSONE (DELTASONE) 10 MG tablet Take 3 tablets (30 mg total) by mouth daily with breakfast. Patient not taking: Reported on 05/11/2017 04/15/17   Faythe Ghee, PA-C  ranitidine (ZANTAC) 150 MG tablet Take 150 mg by mouth 2 (two) times daily. 04/25/17    [provider]  sucralfate (CARAFATE) 1 g tablet Take 1 tablet (1 g total) by mouth 4 (four) times daily. 07/11/17 07/11/18  Schaevitz, Myra Rude, MD    Allergies Bee venom; Vicodin [hydrocodone-acetaminophen]; Hydrocodone-acetaminophen; Mushroom extract complex; and Olive oil  Family History  Problem Relation Age of Onset  . Diabetes Mother   . Diabetes Mellitus II Maternal Grandmother   . Rectal cancer Father     Social History Social History   Tobacco Use  . Smoking status: Former Smoker    Packs/day: 1.00    Years: 15.00    Pack years: 15.00  . Smokeless tobacco: Never Used  Substance Use Topics  . Alcohol use: Not Currently  . Drug use: No    Review of Systems  Constitutional: No fever/chills Eyes: No visual changes. ENT: No sore throat. Cardiovascular: Denies chest pain. Respiratory: Denies shortness of breath. Gastrointestinal: see history of present illness  No nausea, no vomiting.  No diarrhea.  No constipation. Genitourinary: Negative for dysuria. Musculoskeletal: Negative for back pain. Skin: Negative for rash. Neurological: Negative for headaches, focal weakness   ____________________________________________   PHYSICAL EXAM:  VITAL SIGNS: ED Triage Vitals [08/20/17 1411]  Enc Vitals Group     BP 134/70     Pulse      Resp 16     Temp 98.3 F (36.8 C)     Temp Source Oral     SpO2 98 %     Weight (!) 316 lb (143.3 kg)     Height 5\' 9"  (1.753 m)     Head Circumference      Peak Flow      Pain Score 4     Pain Loc      Pain Edu?      Excl. in GC?    \ Constitutional: Alert and oriented. Well appearing and in no acute distress. Eyes: Conjunctivae are normal.  Head: Atraumatic. Nose: No congestion/rhinnorhea. Mouth/Throat: Mucous membranes are moist.  Oropharynx non-erythematous. Neck: No stridor.  Cardiovascular: Normal rate, regular rhythm. Grossly normal heart sounds.  Good peripheral circulation. Respiratory: Normal  respiratory effort.  No retractions. Lungs CTAB. Gastrointestinal: Soft and nontender. No distention. No abdominal bruits. No CVA tenderness. Musculoskeletal: No lower extremity tenderness nor edema.   Neurologic:  Normal speech and language. No gross focal neurologic deficits are appreciated. No gait instability. Skin:  Skin is warm, dry and intact. No rash noted. Psychiatric: Mood and affect are normal. Speech and behavior are normal.  ____________________________________________   LABS (all labs ordered are listed, but only abnormal results are displayed)  Labs Reviewed  BASIC METABOLIC PANEL  CBC  TROPONIN I  TROPONIN I   ____________________________________________  EKG  EKG read and interpreted by me shows sinus bradycardia rate of 56 normal axis he has flipped T's in 3 and lead F lead 3 is somewhat deeper and T-wave inversion and previously he flipped T in lead F there  is slight and no. ____________________________________________  RADIOLOGY  ED MD interpretation  Official radiology report(s): Dg Chest 2 View  Result Date: 08/20/2017 CLINICAL DATA:  Pain EXAM: CHEST - 2 VIEW COMPARISON:  None. FINDINGS: Lungs are clear. Heart size and pulmonary vascularity are normal. No adenopathy. No pneumothorax. No bone lesions. IMPRESSION: No edema or consolidation. Electronically Signed   By: Bretta BangWilliam  Woodruff III M.D.   On: 08/20/2017 14:51    ____________________________________________   PROCEDURES  Procedure(s) performed:   Procedures  Critical Care performed:  ____________________________________________   INITIAL IMPRESSION / ASSESSMENT AND PLAN / ED COURSE   patient's EKG is slightly different than previous EKG however his chest pain sounds more like indigestion or heartburn that anything else that is not worse with exercising or deep breathing it is better with the GI cocktail and his troponin after 7 hours of pain is negative. I will treat this patient with by  mouth acid blocker but in view of his repeated episodes of thrombosis I will send him to cardiology for follow-up.second troponin is negative as well. I will have him follow-up and discharge him now.        ____________________________________________   FINAL CLINICAL IMPRESSION(S) / ED DIAGNOSES  Final diagnoses:  Heart burn     ED Discharge Orders        Ordered    pantoprazole (PROTONIX) 40 MG tablet  Daily     08/20/17 1516       Note:  This document was prepared using Dragon voice recognition software and may include unintentional dictation errors.    Arnaldo NatalMalinda, Theora Vankirk F, MD 08/20/17 [phone]

## 2017-08-20 NOTE — ED Notes (Signed)
Patient transported to X-ray 

## 2017-08-20 NOTE — ED Triage Notes (Signed)
Has some dental carries and has been taking antibiotic (amoxicillin) and tylenol and ibuprofen together.  States last night after taking medications stomach started to burn, c/o headache, back started hurting, and felt chest burning.    Patient is AAOx3.  Skin warm and dry.  NAD.  Patient has history of DVT and takes plavix for history of DVT.  Patient concerned that pain is a medication reaction.

## 2017-08-25 ENCOUNTER — Other Ambulatory Visit (INDEPENDENT_AMBULATORY_CARE_PROVIDER_SITE_OTHER): Payer: Self-pay | Admitting: Vascular Surgery

## 2017-11-28 ENCOUNTER — Emergency Department: Payer: Self-pay

## 2017-11-28 ENCOUNTER — Encounter: Payer: Self-pay | Admitting: Emergency Medicine

## 2017-11-28 ENCOUNTER — Emergency Department
Admission: EM | Admit: 2017-11-28 | Discharge: 2017-11-28 | Disposition: A | Discharge status: Home / Self Care | Payer: Self-pay | Attending: Emergency Medicine | Admitting: Emergency Medicine

## 2017-11-28 ENCOUNTER — Other Ambulatory Visit: Payer: Self-pay

## 2017-11-28 DIAGNOSIS — Z87891 Personal history of nicotine dependence: Secondary | ICD-10-CM | POA: Insufficient documentation

## 2017-11-28 DIAGNOSIS — R600 Localized edema: Secondary | ICD-10-CM | POA: Insufficient documentation

## 2017-11-28 DIAGNOSIS — R2243 Localized swelling, mass and lump, lower limb, bilateral: Secondary | ICD-10-CM | POA: Insufficient documentation

## 2017-11-28 DIAGNOSIS — N5082 Scrotal pain: Secondary | ICD-10-CM

## 2017-11-28 DIAGNOSIS — R609 Edema, unspecified: Secondary | ICD-10-CM

## 2017-11-28 DIAGNOSIS — R1909 Other intra-abdominal and pelvic swelling, mass and lump: Secondary | ICD-10-CM

## 2017-11-28 LAB — URINALYSIS, COMPLETE (UACMP) WITH MICROSCOPIC
BILIRUBIN URINE: NEGATIVE
Bacteria, UA: NONE SEEN
Glucose, UA: NEGATIVE mg/dL
HGB URINE DIPSTICK: NEGATIVE
KETONES UR: NEGATIVE mg/dL
LEUKOCYTES UA: NEGATIVE
NITRITE: NEGATIVE
PH: 6 (ref 5.0–8.0)
Protein, ur: NEGATIVE mg/dL
Specific Gravity, Urine: 1.019 (ref 1.005–1.030)
Squamous Epithelial / LPF: NONE SEEN (ref 0–5)

## 2017-11-28 LAB — COMPREHENSIVE METABOLIC PANEL
ALBUMIN: 4.2 g/dL (ref 3.5–5.0)
ALT: 99 U/L — ABNORMAL HIGH (ref 0–44)
ANION GAP: 6 (ref 5–15)
AST: 53 U/L — ABNORMAL HIGH (ref 15–41)
Alkaline Phosphatase: 53 U/L (ref 38–126)
BILIRUBIN TOTAL: 0.6 mg/dL (ref 0.3–1.2)
BUN: 13 mg/dL (ref 6–20)
CO2: 26 mmol/L (ref 22–32)
Calcium: 8.8 mg/dL — ABNORMAL LOW (ref 8.9–10.3)
Chloride: 108 mmol/L (ref 98–111)
Creatinine, Ser: 0.77 mg/dL (ref 0.61–1.24)
GFR calc Af Amer: >60 mL/min (ref >60)
GFR calc non Af Amer: >60 mL/min (ref >60)
Glucose, Bld: 94 mg/dL (ref 70–99)
POTASSIUM: 4.1 mmol/L (ref 3.5–5.1)
SODIUM: 140 mmol/L (ref 135–145)
TOTAL PROTEIN: 7.4 g/dL (ref 6.5–8.1)

## 2017-11-28 LAB — CBC WITH DIFFERENTIAL/PLATELET
Basophils Absolute: 0.1 10*3/uL (ref 0–0.1)
Basophils Relative: 1 %
EOS ABS: 0.2 10*3/uL (ref 0–0.7)
EOS PCT: 2 %
HEMATOCRIT: 44.4 % (ref 40.0–52.0)
Hemoglobin: 15.2 g/dL (ref 13.0–18.0)
LYMPHS ABS: 1.7 10*3/uL (ref 1.0–3.6)
Lymphocytes Relative: 21 %
MCH: 30.5 pg (ref 26.0–34.0)
MCHC: 34.3 g/dL (ref 32.0–36.0)
MCV: 88.9 fL (ref 80.0–100.0)
MONOS PCT: 8 %
Monocytes Absolute: 0.7 10*3/uL (ref 0.2–1.0)
Neutro Abs: 5.7 10*3/uL (ref 1.4–6.5)
Neutrophils Relative %: 68 %
Platelets: 281 10*3/uL (ref 150–440)
RBC: 4.99 MIL/uL (ref 4.40–5.90)
RDW: 14 % (ref 11.5–14.5)
WBC: 8.3 10*3/uL (ref 3.8–10.6)

## 2017-11-28 NOTE — ED Notes (Signed)
Patient back from ultrasound

## 2017-11-28 NOTE — ED Provider Notes (Signed)
[facility] Emergency Department Provider Note       Time seen: ----------------------------------------- 8:40 AM on 11/28/2017 -----------------------------------------   I have reviewed the triage vital signs and the nursing notes.  HISTORY   Chief Complaint No chief complaint on file.    HPI Thomas Hopkins is a 32 y.o. male with a history of bilateral DVT and possible clotting disorder who presents to the ED for pain in his groin area that he has had on both sides at times for the past week and a half.  Patient also noted some swelling in his left foot.  He denies any injuries, falls or trauma.  Currently he is taking aspirin and Plavix for anticoagulation.  Main area of swelling and discomfort is in his left foot.  Past Medical History:  Diagnosis Date  . DVT of lower extremity, bilateral (HCC)    stopped Eliquis in 2017    Patient Active Problem List   Diagnosis Date Noted  . 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 Hyperlipidemia 03/19/2017  . Atherosclerosis of aorta (HCC) 01/27/2017  . Ischemic foot pain at rest [facility]) 11/18/2016  . Acute venous embolism and thrombosis of deep vessels of distal lower extremity (HCC) 10/22/2014  . Deep venous thrombosis (HCC) 10/22/2014  . RLQ abdominal pain   . Abdominal pain, acute, right lower quadrant 10/13/2014    Past Surgical History:  Procedure Laterality Date  . ABDOMINAL AORTOGRAM W/LOWER EXTREMITY Right 11/18/2016   Procedure: ABDOMINAL AORTOGRAM W/LOWER EXTREMITY;  Surgeon: Renford Dills, MD;  Location: [facility] INVASIVE CV LAB;  Service: Cardiovascular;  Laterality: Right;  . LAPAROSCOPIC APPENDECTOMY N/A 10/14/2014   Procedure: APPENDECTOMY LAPAROSCOPIC;  Surgeon: Lattie Haw, MD;  Location: [facility] ORS;  Service: General;  Laterality: N/A;  . TONSILLECTOMY      Allergies Bee venom; Vicodin [hydrocodone-acetaminophen]; Hydrocodone-acetaminophen; Mushroom  extract complex; and Olive oil  Social History Social History   Tobacco Use  . Smoking status: Former Smoker    Packs/day: 1.00    Years: 15.00    Pack years: 15.00  . Smokeless tobacco: Never Used  Substance Use Topics  . Alcohol use: Not Currently  . Drug use: No   Review of Systems Constitutional: Negative for fever. Cardiovascular: Negative for chest pain. Respiratory: Negative for shortness of breath. Gastrointestinal: Negative for abdominal pain, vomiting and diarrhea. Genitourinary: Positive for groin swelling Musculoskeletal: Positive for left foot and ankle swelling Skin: Negative for rash. Neurological: Negative for headaches, focal weakness or numbness.  All systems negative/normal/unremarkable except as stated in the HPI  ____________________________________________   PHYSICAL EXAM:  VITAL SIGNS: ED Triage Vitals  Enc Vitals Group     BP --      Pulse Rate 11/28/17 0813 69     Resp 11/28/17 0813 18     Temp 11/28/17 0813 98.2 F (36.8 C)     Temp Source 11/28/17 0813 Oral     SpO2 11/28/17 0813 95 %     Weight 11/28/17 0816 (!) 312 lb (141.5 kg)     Height 11/28/17 0816 5\' 9"  (1.753 m)     Head Circumference --      Peak Flow --      Pain Score 11/28/17 0816 4     Pain Loc --      Pain Edu? --      Excl. in GC? --    Constitutional: Alert and oriented. Well appearing and in no distress. Eyes: Conjunctivae  are normal. Normal extraocular movements. ENT   Head: Normocephalic and atraumatic.   Nose: No congestion/rhinnorhea.   Mouth/Throat: Mucous membranes are moist.   Neck: No stridor. Cardiovascular: Normal rate, regular rhythm. No murmurs, rubs, or gallops. Respiratory: Normal respiratory effort without tachypnea nor retractions. Breath sounds are clear and equal bilaterally. No wheezes/rales/rhonchi. Gastrointestinal: Soft and nontender. Normal bowel sounds Genitourinary: No focal testicular swelling or inguinal hernia is  appreciated bilaterally Musculoskeletal: Nontender with normal range of motion in extremities.  Left lower extremity edema is noted Neurologic:  Normal speech and language. No gross focal neurologic deficits are appreciated.  Skin:  Skin is warm, dry and intact. No rash noted. Psychiatric: Mood and affect are normal. Speech and behavior are normal.  ____________________________________________  ED COURSE:  As part of my medical decision making, I reviewed the following data within the electronic MEDICAL RECORD NUMBER History obtained from family if available, nursing notes, old chart and ekg, as well as notes from prior ED visits. Patient presented for groin pain and swelling as well as left lower extremity swelling, we will assess with labs and imaging as indicated at this time.   Procedures ____________________________________________   LABS (pertinent positives/negatives)  Labs Reviewed  COMPREHENSIVE METABOLIC PANEL - Abnormal; Notable for the following components:      Result Value   Calcium 8.8 (*)    AST 53 (*)    ALT 99 (*)    All other components within normal limits  URINALYSIS, COMPLETE (UACMP) WITH MICROSCOPIC - Abnormal; Notable for the following components:   Color, Urine YELLOW (*)    APPearance CLEAR (*)    All other components within normal limits  CBC WITH DIFFERENTIAL/PLATELET    RADIOLOGY Images were viewed by me  Left lower extremity ultrasound, scrotal ultrasound IMPRESSION: No definite evidence of deep venous thrombosis seen in the left lower extremity. IMPRESSION: No evidence of testicular mass or torsion. Small bilateral epididymal cysts.  ____________________________________________  DIFFERENTIAL DIAGNOSIS   DVT, peripheral edema, inguinal hernia, epididymal orchitis  FINAL ASSESSMENT AND PLAN  Edema, groin swelling   Plan: The patient had presented for left foot and ankle swelling of uncertain etiology.  He also had some groin swelling  which I do not appreciate at this time.  Patient's labs were normal. Patient's imaging is also normal.  He is cleared for outpatient follow-up with his doctor.   Ulice DashJohnathan E Belissa Kooy, MD   Note: This note was generated in part or whole with voice recognition software. Voice recognition is usually quite accurate but there are transcription errors that can and very often do occur. I apologize for any typographical errors that were not detected and corrected.     Emily FilbertWilliams, Jaivon E, MD 11/28/17 1026

## 2017-11-28 NOTE — ED Triage Notes (Signed)
Left foot swelling and discomfort last pm denies known injury to area, also, pain in groin bilaterally that began about 2 weeks ago. Appears in NAD.

## 2017-11-28 NOTE — ED Notes (Signed)
Says he has pain in groin and it has been on both sides at times for aobut 1.5 weeks.  Says he noticed having swelling in left foot.  Has discomfort there, but really didn't notice til he saw it.  He says he did not injure himself.  Pt in nad.  Has already been seen by md.  Blood draw from left ac.

## 2017-11-28 NOTE — ED Notes (Signed)
pateint is still in ultrasound

## 2018-01-01 ENCOUNTER — Other Ambulatory Visit (INDEPENDENT_AMBULATORY_CARE_PROVIDER_SITE_OTHER): Payer: Self-pay | Admitting: Vascular Surgery

## 2018-01-26 ENCOUNTER — Encounter (INDEPENDENT_AMBULATORY_CARE_PROVIDER_SITE_OTHER): Payer: Self-pay

## 2018-01-26 ENCOUNTER — Ambulatory Visit: Payer: Self-pay | Admitting: Pharmacy Technician

## 2018-01-26 DIAGNOSIS — Z79899 Other long term (current) drug therapy: Secondary | ICD-10-CM

## 2018-01-26 NOTE — Progress Notes (Signed)
Completed [facility] application and contract.  Patient agreed to all terms of the [facility] contract.   Patient approved to receive medication assistance at [facility] as long as eligibility criteria continues to be met.    Provided patient with community resource material based on his particular needs.    Thomas Hopkins Thomas Hopkins Care Manager [facility]  

## 2018-06-13 ENCOUNTER — Ambulatory Visit: Payer: Self-pay

## 2018-06-13 ENCOUNTER — Ambulatory Visit
Admission: RE | Admit: 2018-06-13 | Discharge: 2018-06-13 | Disposition: A | Discharge status: Home / Self Care | Payer: Self-pay | Source: Ambulatory Visit | Attending: Vascular Surgery | Admitting: Vascular Surgery

## 2018-06-13 ENCOUNTER — Other Ambulatory Visit: Payer: Self-pay

## 2018-06-13 DIAGNOSIS — I700 Atherosclerosis of aorta: Secondary | ICD-10-CM | POA: Insufficient documentation

## 2018-06-13 HISTORY — DX: Essential (primary) hypertension: I10

## 2018-06-13 MED ORDER — IOPAMIDOL (ISOVUE-370) INJECTION 76%
100.0000 mL | Freq: Once | INTRAVENOUS | Status: AC | PRN
  Administered 2018-06-13: 10:00:00 100 mL via INTRAVENOUS

## 2018-06-14 ENCOUNTER — Ambulatory Visit (INDEPENDENT_AMBULATORY_CARE_PROVIDER_SITE_OTHER): Payer: Self-pay | Admitting: Vascular Surgery

## 2018-06-14 ENCOUNTER — Encounter (INDEPENDENT_AMBULATORY_CARE_PROVIDER_SITE_OTHER): Payer: Self-pay | Admitting: Vascular Surgery

## 2018-06-14 VITALS — BP 115/68 | HR 78 | Resp 16 | Ht 69.0 in | Wt 310.8 lb

## 2018-06-14 DIAGNOSIS — Z79899 Other long term (current) drug therapy: Secondary | ICD-10-CM

## 2018-06-14 DIAGNOSIS — Z7902 Long term (current) use of antithrombotics/antiplatelets: Secondary | ICD-10-CM

## 2018-06-14 DIAGNOSIS — E782 Mixed hyperlipidemia: Secondary | ICD-10-CM

## 2018-06-14 DIAGNOSIS — I743 Embolism and thrombosis of arteries of the lower extremities: Secondary | ICD-10-CM

## 2018-06-14 DIAGNOSIS — I82439 Acute embolism and thrombosis of unspecified popliteal vein: Secondary | ICD-10-CM

## 2018-06-14 NOTE — Progress Notes (Signed)
MRN : [id number]  Thomas Hopkins is a 33 y.o. ([birth year]) male who presents with chief complaint of  Chief Complaint  Patient presents with   Follow-up    yearly follow up and ct results  .  History of Present Illness:   The patient returns to the office for follow up.There has been no further episodes of embolization. There have been no interval changes in lower extremity symptoms. No interval shortening of the patient's claudication distance or development of rest pain symptoms. No new ulcers or wounds have occurred since the last visit.No new symptoms to suggest he has had another embolism  He is complaining of groin pain with sitting and standing it is intermittent and varies in intensity.  Historically: He is s/p right leg thromboembolectomy of the tibial vessels and angioplaty of the PT on 11/18/2016. 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  There have been no significant changes to the patient's overall health care.  The patient denies amaurosis fugax or recent TIA symptoms. There are no recent neurological changes noted. The patient denies history of DVT, PE or superficial thrombophlebitis.  CT shows resolution of the distal aortic thrombus with preservation of the iliofemoral and popliteal system  Current Meds  Medication Sig   acetaminophen (TYLENOL) 500 MG tablet Take 500 mg by mouth every 6 (six) hours as needed.   aspirin 325 MG tablet Take 325 mg by mouth daily.   atorvastatin (LIPITOR) 20 MG tablet TAKE 1 TABLET BY MOUTH EVERY DAY AT 6PM   clopidogrel (PLAVIX) 75 MG tablet TAKE 1 TABLET BY MOUTH EVERY DAY    Past Medical History:  Diagnosis Date   DVT of lower extremity, bilateral (HCC)    stopped Eliquis in 2017   Hypertension     Past Surgical History:  Procedure Laterality Date   ABDOMINAL AORTOGRAM W/LOWER EXTREMITY Right 11/18/2016   Procedure: ABDOMINAL AORTOGRAM W/LOWER EXTREMITY;   Surgeon: Renford Dills, MD;  Location: [facility] INVASIVE CV [facility];  Service: Cardiovascular;  Laterality: Right;   LAPAROSCOPIC APPENDECTOMY N/A 10/14/2014   Procedure: APPENDECTOMY LAPAROSCOPIC;  Surgeon: Lattie Haw, MD;  Location: [facility] ORS;  Service: General;  Laterality: N/A;   TONSILLECTOMY      Social History Social History   Tobacco Use   Smoking status: Former Smoker    Packs/day: 1.00    Years: 15.00    Pack years: 15.00   Smokeless tobacco: Never Used  Substance Use Topics   Alcohol use: Not Currently   Drug use: No    Family History Family History  Problem Relation Age of Onset   Diabetes Mother    Diabetes Mellitus II Maternal Grandmother    Rectal cancer Father     Allergies  Allergen Reactions   Bee Venom Hives   Vicodin [Hydrocodone-Acetaminophen] Nausea And Vomiting    Headache   Hydrocodone-Acetaminophen Nausea And Vomiting    Headache   Mushroom Extract Complex Nausea And Vomiting   Olive Oil Nausea And Vomiting     REVIEW OF SYSTEMS (Negative unless checked)  Constitutional: [] Weight loss  [] Fever  [] Chills Cardiac: [] Chest pain   [] Chest pressure   [] Palpitations   [] Shortness of breath when laying flat   [] Shortness of breath with exertion. Vascular:  [] Pain in legs with walking   [] Pain in legs at rest  [x] History of DVT   [] Phlebitis   [] Swelling in legs   [] Varicose veins   [] Non-healing ulcers Pulmonary:   []   Uses home oxygen   Productive cough   Hemoptysis   Wheeze  COPD   Asthma Neurologic:  Dizziness   Seizures   History of stroke   History of TIA  Aphasia   Vissual changes   Weakness or numbness in arm   Weakness or numbness in leg Musculoskeletal:   Joint swelling   Joint pain   Low back pain Hematologic:  Easy bruising  Easy bleeding   Hypercoagulable state   Anemic Gastrointestinal:  Diarrhea   Vomiting  Gastroesophageal reflux/heartburn   Difficulty  swallowing. Genitourinary:  Chronic kidney disease   Difficult urination  Frequent urination   Blood in urine Skin:  Rashes   Ulcers  Psychological:  History of anxiety    History of major depression.  Physical Examination  Vitals:   06/14/18 1447  BP: 115/68  Pulse: 78  Resp: 16  Weight: (!) 310 lb 12.8 oz (141 kg)  Height:  (1.753 m)   Body mass index is 45.9 kg/m. Gen: WD/WN, NAD Head: [place]/AT, No temporalis wasting.  Ear/Nose/Throat: Hearing grossly intact, nares w/o erythema or drainage Eyes: PER, EOMI, sclera nonicteric.  Neck: Supple, no large masses.   Pulmonary:  Good air movement, no audible wheezing bilaterally, no use of accessory muscles.  Cardiac: RRR, no JVD Vascular:  Vessel Right Left  Radial Palpable Palpable  Gastrointestinal: Non-distended. No guarding/no peritoneal signs. I believe he has small inguinal hernias by exam Musculoskeletal: M/S 5/5 throughout.  No deformity or atrophy.  Neurologic: CN 2-12 intact. Symmetrical.  Speech is fluent. Motor exam as listed above. Psychiatric: Judgment intact, Mood & affect appropriate for pt's clinical situation. Dermatologic: No rashes or ulcers noted.  No changes consistent with cellulitis. Lymph : No lichenification or skin changes of chronic lymphedema.  CBC Lab Results  Component Value Date   WBC 8.3 11/28/2017   HGB 15.2 11/28/2017   HCT 44.4 11/28/2017   MCV 88.9 11/28/2017   PLT 281 11/28/2017    BMET    Component Value Date/Time   NA 140 11/28/2017 0834   K 4.1 11/28/2017 0834   CL 108 11/28/2017 0834   CO2 26 11/28/2017 0834   GLUCOSE 94 11/28/2017 0834   BUN 13 11/28/2017 0834   CREATININE 0.77 11/28/2017 0834   CALCIUM 8.8 (L) 11/28/2017 0834   GFRNONAA >60 11/28/2017 0834   GFRAA >60 11/28/2017 0834   CrCl cannot be calculated (Patient's most recent lab result is older than the maximum 21 days allowed.).  COAG Lab Results  Component Value Date   INR 1.05  11/18/2016   INR 1.23 10/23/2014    Radiology Ct Angio Chest Aorta W/cm &/or Wo/cm  Result Date: 06/13/2018 CLINICAL DATA:  Suspected clotting disorder with prior mural thrombus of the distal aorta extending into the right common iliac artery. EXAM: CT ANGIOGRAPHY CHEST, ABDOMEN AND PELVIS TECHNIQUE: Multidetector CT imaging through the chest, abdomen and pelvis was performed using the standard protocol during bolus administration of intravenous contrast. Multiplanar reconstructed images and MIPs were obtained and reviewed to evaluate the vascular anatomy. CONTRAST:  ISOVUE-370 IOPAMIDOL (ISOVUE-370) INJECTION 76% COMPARISON:  07/11/2017, 04/10/2017 and 11/18/2016 FINDINGS: CTA CHEST FINDINGS Cardiovascular: Timing of contrast opacification was not optimal for thoracic aortic evaluation. However, opacification is fairly adequate and the thoracic aorta shows normal patency without evidence of atherosclerosis, thrombus, aneurysmal disease or dissection. Proximal great vessels are normally patent and demonstrate normal branching anatomy. The heart size is normal. No visible intracardiac thrombus or masses. No  pericardial fluid identified. Central pulmonary arteries are normal in caliber. Mediastinum/Nodes: Mildly prominent right hilar lymph node measures 12 mm in short axis. There are some other tiny scattered nonenlarged mediastinal, hilar and axillary lymph nodes. Esophagus, trachea and thyroid gland appear unremarkable by CT. Lungs/Pleura: There is no evidence of pulmonary edema, consolidation, pneumothorax, nodule or pleural fluid. Musculoskeletal: No chest wall abnormality. No acute or significant osseous findings. Review of the MIP images confirms the above findings. CTA ABDOMEN AND PELVIS FINDINGS VASCULAR Aorta: The abdominal aorta is normally patent. There is no further suggestion of any significant mural thrombus or plaque. No dissection. No evidence of vasculitis. Celiac: Normally patent.  Distal  branch vessels are normally patent. SMA: Normally patent. Renals: 2 separate right renal arteries and 2 separate left renal arteries demonstrate stable and normal patency. IMA: Normally patent. Inflow: Normally patent bilateral iliac arteries without evidence of thrombus, plaque, stenosis or aneurysm. Normally patent bilateral common femoral arteries and femoral bifurcations. Veins: Delayed imaging through the level of the kidneys demonstrates normal patency of visualized venous structures. Review of the MIP images confirms the above findings. NON-VASCULAR Hepatobiliary: No focal liver abnormality is seen. No gallstones, gallbladder wall thickening, or biliary dilatation. Pancreas: Unremarkable. No pancreatic ductal dilatation or surrounding inflammatory changes. Spleen: Normal in size without focal abnormality. Adrenals/Urinary Tract: Adrenal glands are unremarkable. Kidneys are normal, without renal calculi, focal lesion, or hydronephrosis. Bladder is unremarkable. Stomach/Bowel: Bowel shows no evidence of obstruction, inflammation or lesion. No free air. Status post appendectomy. Lymphatic: No enlarged lymph nodes are identified in the abdomen or pelvis. Reproductive: Prostate is unremarkable. Other: No hernias.  No abnormal fluid collections or free fluid. Musculoskeletal: No acute or significant osseous findings. Review of the MIP images confirms the above findings. IMPRESSION: 1. Unremarkable appearance of thoracic aorta. 2. No further significant evidence of mural thrombus or plaque in the distal abdominal aorta. 3. Mildly prominent right hilar lymph node measuring 12 mm in short axis. This is nonspecific. Electronically Signed   By: Irish Lack M.D.   On: 06/13/2018 11:33   Ct Angio Abd/pel W/ And/or W/o  Result Date: 06/13/2018 CLINICAL DATA:  Suspected clotting disorder with prior mural thrombus of the distal aorta extending into the right common iliac artery. EXAM: CT ANGIOGRAPHY CHEST, ABDOMEN AND  PELVIS TECHNIQUE: Multidetector CT imaging through the chest, abdomen and pelvis was performed using the standard protocol during bolus administration of intravenous contrast. Multiplanar reconstructed images and MIPs were obtained and reviewed to evaluate the vascular anatomy. CONTRAST:  ISOVUE-370 IOPAMIDOL (ISOVUE-370) INJECTION 76% COMPARISON:  07/11/2017, 04/10/2017 and 11/18/2016 FINDINGS: CTA CHEST FINDINGS Cardiovascular: Timing of contrast opacification was not optimal for thoracic aortic evaluation. However, opacification is fairly adequate and the thoracic aorta shows normal patency without evidence of atherosclerosis, thrombus, aneurysmal disease or dissection. Proximal great vessels are normally patent and demonstrate normal branching anatomy. The heart size is normal. No visible intracardiac thrombus or masses. No pericardial fluid identified. Central pulmonary arteries are normal in caliber. Mediastinum/Nodes: Mildly prominent right hilar lymph node measures 12 mm in short axis. There are some other tiny scattered nonenlarged mediastinal, hilar and axillary lymph nodes. Esophagus, trachea and thyroid gland appear unremarkable by CT. Lungs/Pleura: There is no evidence of pulmonary edema, consolidation, pneumothorax, nodule or pleural fluid. Musculoskeletal: No chest wall abnormality. No acute or significant osseous findings. Review of the MIP images confirms the above findings. CTA ABDOMEN AND PELVIS FINDINGS VASCULAR Aorta: The abdominal aorta is normally patent. There is  no further suggestion of any significant mural thrombus or plaque. No dissection. No evidence of vasculitis. Celiac: Normally patent.  Distal branch vessels are normally patent. SMA: Normally patent. Renals: 2 separate right renal arteries and 2 separate left renal arteries demonstrate stable and normal patency. IMA: Normally patent. Inflow: Normally patent bilateral iliac arteries without evidence of thrombus, plaque, stenosis  or aneurysm. Normally patent bilateral common femoral arteries and femoral bifurcations. Veins: Delayed imaging through the level of the kidneys demonstrates normal patency of visualized venous structures. Review of the MIP images confirms the above findings. NON-VASCULAR Hepatobiliary: No focal liver abnormality is seen. No gallstones, gallbladder wall thickening, or biliary dilatation. Pancreas: Unremarkable. No pancreatic ductal dilatation or surrounding inflammatory changes. Spleen: Normal in size without focal abnormality. Adrenals/Urinary Tract: Adrenal glands are unremarkable. Kidneys are normal, without renal calculi, focal lesion, or hydronephrosis. Bladder is unremarkable. Stomach/Bowel: Bowel shows no evidence of obstruction, inflammation or lesion. No free air. Status post appendectomy. Lymphatic: No enlarged lymph nodes are identified in the abdomen or pelvis. Reproductive: Prostate is unremarkable. Other: No hernias.  No abnormal fluid collections or free fluid. Musculoskeletal: No acute or significant osseous findings. Review of the MIP images confirms the above findings. IMPRESSION: 1. Unremarkable appearance of thoracic aorta. 2. No further significant evidence of mural thrombus or plaque in the distal abdominal aorta. 3. Mildly prominent right hilar lymph node measuring 12 mm in short axis. This is nonspecific. Electronically Signed   By: Irish LackGlenn  Yamagata M.D.   On: 06/13/2018 11:33     Assessment/Plan 1. 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  The patient does not voice lifestyle limiting changes at this point in time.  CT scan does not suggest clinically significant change, previous mural thrombus noted in the  Initial scan has completely resolved and has been gone for two years now.  He will take generic Plavix and full strength ASA as he could not afford  Eliqus  No invasive studies, angiography or surgery at this time The patient should continue walking and begin a more formal exercise program.  The patient should continue antiplatelet therapy and aggressive treatment of the lipid abnormalities  No changes in the patient's medications at this time  The patient should continue wearing graduated compression socks 10-15 mmHg strength to control the mild edema.   I have recommended he follow up with a General Surgeon for more complete evaluation of his Inguinal hernias and he voices understanding  2. Deep vein thrombosis (DVT) of popliteal vein, unspecified chronicity, unspecified laterality (HCC) See #1  3. Mixed hyperlipidemia Continue statin as ordered and reviewed, no changes at this time    Levora DredgeGregory Osa Fogarty, MD  06/14/2018 3:05 PM

## 2018-07-29 ENCOUNTER — Encounter: Payer: Self-pay | Admitting: Emergency Medicine

## 2018-07-29 ENCOUNTER — Other Ambulatory Visit: Payer: Self-pay

## 2018-07-29 ENCOUNTER — Emergency Department
Admission: EM | Admit: 2018-07-29 | Discharge: 2018-07-29 | Disposition: A | Discharge status: Home / Self Care | Payer: Self-pay | Attending: Emergency Medicine | Admitting: Emergency Medicine

## 2018-07-29 DIAGNOSIS — Z7982 Long term (current) use of aspirin: Secondary | ICD-10-CM | POA: Insufficient documentation

## 2018-07-29 DIAGNOSIS — I10 Essential (primary) hypertension: Secondary | ICD-10-CM | POA: Insufficient documentation

## 2018-07-29 DIAGNOSIS — Z87891 Personal history of nicotine dependence: Secondary | ICD-10-CM | POA: Insufficient documentation

## 2018-07-29 DIAGNOSIS — Z79899 Other long term (current) drug therapy: Secondary | ICD-10-CM | POA: Insufficient documentation

## 2018-07-29 DIAGNOSIS — L237 Allergic contact dermatitis due to plants, except food: Secondary | ICD-10-CM | POA: Insufficient documentation

## 2018-07-29 MED ORDER — DEXAMETHASONE SODIUM PHOSPHATE 10 MG/ML IJ SOLN
10.0000 mg | Freq: Once | INTRAMUSCULAR | Status: AC
  Administered 2018-07-29: 10 mg via INTRAMUSCULAR
  Filled 2018-07-29: qty 1

## 2018-07-29 NOTE — ED Provider Notes (Signed)
[facility] Emergency Department Provider Note  ____________________________________________  Time seen: Approximately 3:16 PM  I have reviewed the triage vital signs and the nursing notes.   HISTORY  Chief Complaint Rash   HPI Thomas Hopkins is a 33 y.o. male presents to the emergency department for treatment and evaluation of rash. He has been outside working and moving some things around. He feels confident it is poison ivy. No relief with alcohol wipes and Calamine lotion.   Past Medical History:  Diagnosis Date  . DVT of lower extremity, bilateral (HCC)    stopped Eliquis in 2017  . Hypertension     Patient Active Problem List   Diagnosis Date Noted  . 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 Hyperlipidemia 03/19/2017  . Atherosclerosis of aorta (HCC) 01/27/2017  . Ischemic foot pain at rest [facility]) 11/18/2016  . Acute venous embolism and thrombosis of deep vessels of distal lower extremity (HCC) 10/22/2014  . Deep venous thrombosis (HCC) 10/22/2014  . RLQ abdominal pain   . Abdominal pain, acute, right lower quadrant 10/13/2014    Past Surgical History:  Procedure Laterality Date  . ABDOMINAL AORTOGRAM W/LOWER EXTREMITY Right 11/18/2016   Procedure: ABDOMINAL AORTOGRAM W/LOWER EXTREMITY;  Surgeon: Renford Dills, MD;  Location: [facility] INVASIVE CV LAB;  Service: Cardiovascular;  Laterality: Right;  . LAPAROSCOPIC APPENDECTOMY N/A 10/14/2014   Procedure: APPENDECTOMY LAPAROSCOPIC;  Surgeon: Lattie Haw, MD;  Location: [facility] ORS;  Service: General;  Laterality: N/A;  . TONSILLECTOMY      Prior to Admission medications   Medication Sig Start Date End Date Taking? Authorizing Provider  acetaminophen (TYLENOL) 500 MG tablet Take 500 mg by mouth every 6 (six) hours as needed.    [provider]  amoxicillin (AMOXIL) 500 MG capsule Take 1 capsule (500 mg total) by mouth 3 (three) times daily. Patient not  taking: Reported on 06/14/2018 08/06/17   Menshew, Charlesetta Ivory, PA-C  aspirin 325 MG tablet Take 325 mg by mouth daily.    [provider]  Aspirin-Acetaminophen-Caffeine [phone] MG PACK Take by mouth.    [provider]  atorvastatin (LIPITOR) 20 MG tablet TAKE 1 TABLET BY MOUTH EVERY DAY AT 6PM 08/25/17   Schnier, Latina Craver, MD  cephALEXin (KEFLEX) 500 MG capsule Take 1 capsule (500 mg total) by mouth 3 (three) times daily. Patient not taking: Reported on 05/11/2017 04/15/17   Faythe Ghee, PA-C  clopidogrel (PLAVIX) 75 MG tablet TAKE 1 TABLET BY MOUTH EVERY DAY 01/01/18   Schnier, Latina Craver, MD  cyclobenzaprine (FLEXERIL) 5 MG tablet Take 1 tablet (5 mg total) by mouth 3 (three) times daily as needed for muscle spasms. Patient not taking: Reported on 06/14/2018 08/06/17   Menshew, Charlesetta Ivory, PA-C  ibuprofen (ADVIL,MOTRIN) 600 MG tablet Take 1 tablet (600 mg total) by mouth every 6 (six) hours as needed. Patient not taking: Reported on 05/11/2017 03/23/17   Joni Reining, PA-C  pantoprazole (PROTONIX) 40 MG tablet Take 1 tablet (40 mg total) by mouth daily. Patient not taking: Reported on 06/14/2018 08/20/17 08/20/18  Arnaldo Natal, MD  predniSONE (DELTASONE) 10 MG tablet Take 3 tablets (30 mg total) by mouth daily with breakfast. Patient not taking: Reported on 05/11/2017 04/15/17   Faythe Ghee, PA-C  ranitidine (ZANTAC) 150 MG tablet Take 150 mg by mouth 2 (two) times daily. 04/25/17   [provider]  sucralfate (CARAFATE) 1 g tablet Take 1  tablet (1 g total) by mouth 4 (four) times daily. Patient not taking: Reported on 06/14/2018 07/11/17 07/11/18  Myrna Blazer, MD    Allergies Bee venom; Vicodin [hydrocodone-acetaminophen]; Hydrocodone-acetaminophen; Mushroom extract complex; and Olive oil  Family History  Problem Relation Age of Onset  . Diabetes Mother   . Diabetes Mellitus II Maternal Grandmother   . Rectal cancer Father     Social  History Social History   Tobacco Use  . Smoking status: Former Smoker    Packs/day: 1.00    Years: 15.00    Pack years: 15.00  . Smokeless tobacco: Never Used  Substance Use Topics  . Alcohol use: Not Currently  . Drug use: No    Review of Systems  Constitutional: Negative for fever. Respiratory: Negative for cough or shortness of breath.  Musculoskeletal: Negative for myalgias Skin: Positive for rash on the face, upper extremities, and pelvis/groin area Neurological: Negative for numbness or paresthesias. ____________________________________________   PHYSICAL EXAM:  VITAL SIGNS: ED Triage Vitals  Enc Vitals Group     BP 07/29/18 1352 120/79     Pulse Rate 07/29/18 1352 62     Resp 07/29/18 1352 16     Temp 07/29/18 1352 98.1 F (36.7 C)     Temp Source 07/29/18 1352 Oral     SpO2 07/29/18 1352 96 %     Weight --      Height --      Head Circumference --      Peak Flow --      Pain Score 07/29/18 1351 0     Pain Loc --      Pain Edu? --      Excl. in GC? --      Constitutional: Well appearing. Eyes: Conjunctivae are clear without discharge or drainage. Nose: No rhinorrhea noted. Mouth/Throat: Airway is patent.  Neck: No stridor. Unrestricted range of motion observed. Cardiovascular: Capillary refill is <3 seconds.  Respiratory: Respirations are even and unlabored.. Musculoskeletal: Unrestricted range of motion observed. Neurologic: Awake, alert, and oriented x 4.  Skin: Vesicular rash with erythema noted on the face, hands and wrists, groin and pelvic area.  ____________________________________________   LABS (all labs ordered are listed, but only abnormal results are displayed)  Labs Reviewed - No data to display ____________________________________________  EKG  Not indicated. ____________________________________________  RADIOLOGY  Not  indicated ____________________________________________   PROCEDURES  Procedures ____________________________________________   INITIAL IMPRESSION / ASSESSMENT AND PLAN / ED COURSE  Thomas Hopkins is a 33 y.o. male who presents to the emergency department for treatment and evaluation of rash.  Symptoms and exam plus history are all consistent with a poison ivy contact dermatitis for which he was given an injection of Decadron while here.  Patient was encouraged to take Benadryl every 6 hours if needed.  He is to follow-up with his primary care provider or return to the emergency department for symptoms of change or worsen or do not improve.   Medications  dexamethasone (DECADRON) injection 10 mg (10 mg Intramuscular Given 07/29/18 1533)     Pertinent labs & imaging results that were available during my care of the patient were reviewed by me and considered in my medical decision making (see chart for details).  ____________________________________________   FINAL CLINICAL IMPRESSION(S) / ED DIAGNOSES  Final diagnoses:  Contact dermatitis due to poison ivy    ED Discharge Orders    None       Note:  This document was  prepared using Conservation officer, historic buildingsDragon voice recognition software and may include unintentional dictation errors.   Chinita Pesterriplett, Jonie Burdell B, FNP 07/29/18 2337    Sharman CheekStafford, Phillip, MD 08/01/18 [phone]

## 2018-07-29 NOTE — ED Triage Notes (Signed)
Pt to ED via POV c/o rash. Pt thinks that he has poison ivy. Pt is in NAD.

## 2018-07-29 NOTE — Discharge Instructions (Signed)
Please follow up with your primary care provider for symptoms that are not improving over the next few days.  Return to the ER for symptoms that change or worsen if unable to schedule an appointment. 

## 2019-03-06 ENCOUNTER — Telehealth: Payer: Self-pay | Admitting: Pharmacy Technician

## 2019-03-06 NOTE — Telephone Encounter (Signed)
Patient failed to provide requested 2020 financial documentation.  No additional medication assistance will be provided by [facility] without the required proof of income documentation.  Patient notified by letter.  Armonie Staten J. Elisha Cooksey Care Manager [facility] 

## 2019-03-18 ENCOUNTER — Telehealth (INDEPENDENT_AMBULATORY_CARE_PROVIDER_SITE_OTHER): Payer: Self-pay

## 2019-03-18 NOTE — Telephone Encounter (Signed)
Patient called requesting medication refill for Atorvastatin 20mg  and Clopidogrel 75mg . I spoke with Dr and stated that its fine to call in Clopidogrel 75mg  with 11 refills and Atorvastatin 20mg  with 2 refills into [facility]. The patient was advise to establish care with a PCP for further refills for Atorvastatin.

## 2019-06-17 ENCOUNTER — Encounter (INDEPENDENT_AMBULATORY_CARE_PROVIDER_SITE_OTHER): Payer: Self-pay | Admitting: Vascular Surgery

## 2019-06-17 ENCOUNTER — Ambulatory Visit (INDEPENDENT_AMBULATORY_CARE_PROVIDER_SITE_OTHER): Payer: Self-pay | Admitting: Vascular Surgery

## 2019-06-17 ENCOUNTER — Other Ambulatory Visit: Payer: Self-pay

## 2019-06-17 VITALS — BP 130/79 | HR 78 | Resp 16 | Wt 306.0 lb

## 2019-06-17 DIAGNOSIS — I743 Embolism and thrombosis of arteries of the lower extremities: Secondary | ICD-10-CM

## 2019-06-17 DIAGNOSIS — I82439 Acute embolism and thrombosis of unspecified popliteal vein: Secondary | ICD-10-CM

## 2019-06-17 DIAGNOSIS — E782 Mixed hyperlipidemia: Secondary | ICD-10-CM

## 2019-06-17 NOTE — Progress Notes (Signed)
MRN : [id number]  Thomas Hopkins is a 34 y.o. (February 27, 1986) male who presents with chief complaint of  Chief Complaint  Patient presents with   Follow-up    1year follow up  .  History of Present Illness:   The patient returns to the office forfollow up.There hasbeen no further episodes of embolization. There have been no interval changes in lower extremity symptoms. No interval shortening of the patient's claudication distance or development of rest pain symptoms. No new ulcers or wounds have occurred since the last visit.No new symptoms to suggest he has had another embolism  The previous groin pain has resolved.  Historically: He is s/p right leg thromboembolectomy of the tibial vessels and angioplaty of the PT on 11/18/2016. 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  There have been no significant changes to the patient's overall health care.  The patient denies amaurosis fugax or recent TIA symptoms. There are no recent neurological changes noted. The patient has history of DVT but no PE or superficial thrombophlebitis.  Previous CT shows resolution of the distal aortic thrombus with preservation of the iliofemoral and popliteal system  Current Meds  Medication Sig   acetaminophen (TYLENOL) 500 MG tablet Take 500 mg by mouth every 6 (six) hours as needed.   aspirin 325 MG tablet Take 325 mg by mouth daily.   atorvastatin (LIPITOR) 20 MG tablet TAKE 1 TABLET BY MOUTH EVERY DAY AT 6PM   clopidogrel (PLAVIX) 75 MG tablet TAKE 1 TABLET BY MOUTH EVERY DAY    Past Medical History:  Diagnosis Date   DVT of lower extremity, bilateral (HCC)    stopped Eliquis in 2017   Hypertension     Past Surgical History:  Procedure Laterality Date   ABDOMINAL AORTOGRAM W/LOWER EXTREMITY Right 11/18/2016   Procedure: ABDOMINAL AORTOGRAM W/LOWER EXTREMITY;  Surgeon: Renford Dills, MD;  Location: [facility];   Service: Cardiovascular;  Laterality: Right;   LAPAROSCOPIC APPENDECTOMY N/A 10/14/2014   Procedure: APPENDECTOMY LAPAROSCOPIC;  Surgeon: Lattie Haw, MD;  Location: [facility] ORS;  Service: General;  Laterality: N/A;   TONSILLECTOMY      Social History Social History   Tobacco Use   Smoking status: Former Smoker    Packs/day: 1.00    Years: 15.00    Pack years: 15.00   Smokeless tobacco: Never Used  Substance Use Topics   Alcohol use: Not Currently   Drug use: No    Family History Family History  Problem Relation Age of Onset   Diabetes Mother    Diabetes Mellitus II Maternal Grandmother    Rectal cancer Father     Allergies  Allergen Reactions   Bee Venom Hives   Vicodin [Hydrocodone-Acetaminophen] Nausea And Vomiting    Headache   Hydrocodone-Acetaminophen Nausea And Vomiting    Headache   Mushroom Extract Complex Nausea And Vomiting   Olive Oil Nausea And Vomiting     REVIEW OF SYSTEMS (Negative unless checked)  Constitutional: [] Weight loss  [] Fever  [] Chills Cardiac: [] Chest pain   [] Chest pressure   [] Palpitations   [] Shortness of breath when laying flat   [] Shortness of breath with exertion. Vascular:  [] Pain in legs with walking   [] Pain in legs at rest  [x] History of DVT   [] Phlebitis   [] Swelling in legs   [] Varicose veins   [] Non-healing ulcers Pulmonary:   [] Uses home oxygen   [] Productive cough   [] Hemoptysis   []   Wheeze  [] COPD   [] Asthma Neurologic:  [] Dizziness   [] Seizures   [] History of stroke   [] History of TIA  [] Aphasia   [] Vissual changes   [] Weakness or numbness in arm   [] Weakness or numbness in leg Musculoskeletal:   [] Joint swelling   [] Joint pain   [] Low back pain Hematologic:  [] Easy bruising  [] Easy bleeding   [] Hypercoagulable state   [] Anemic Gastrointestinal:  [] Diarrhea   [] Vomiting  [] Gastroesophageal reflux/heartburn   [] Difficulty swallowing. Genitourinary:  [] Chronic kidney disease   [] Difficult urination  [] Frequent  urination   [] Blood in urine Skin:  [] Rashes   [] Ulcers  Psychological:  [] History of anxiety   []  History of major depression.  Physical Examination  Vitals:   06/17/19 1436  BP: 130/79  Pulse: 78  Resp: 16  Weight: (!) 306 lb (138.8 kg)   Body mass index is 45.19 kg/m. Gen: WD/WN, NAD Head: [place]/AT, No temporalis wasting.  Ear/Nose/Throat: Hearing grossly intact, nares w/o erythema or drainage Eyes: PER, EOMI, sclera nonicteric.  Neck: Supple, no large masses.   Pulmonary:  Good air movement, no audible wheezing bilaterally, no use of accessory muscles.  Cardiac: RRR, no JVD Vascular:  Vessel Right Left  Radial Palpable Palpable  PT Palpable Palpable  DP Palpable Palpable  Gastrointestinal: Non-distended. No guarding/no peritoneal signs.  Musculoskeletal: M/S 5/5 throughout.  No deformity or atrophy.  Neurologic: CN 2-12 intact. Symmetrical.  Speech is fluent. Motor exam as listed above. Psychiatric: Judgment intact, Mood & affect appropriate for pt's clinical situation. Dermatologic: No rashes or ulcers noted.  No changes consistent with cellulitis.   CBC Lab Results  Component Value Date   WBC 8.3 11/28/2017   HGB 15.2 11/28/2017   HCT 44.4 11/28/2017   MCV 88.9 11/28/2017   PLT 281 11/28/2017    BMET    Component Value Date/Time   NA 140 11/28/2017 0834   K 4.1 11/28/2017 0834   CL 108 11/28/2017 0834   CO2 26 11/28/2017 0834   GLUCOSE 94 11/28/2017 0834   BUN 13 11/28/2017 0834   CREATININE 0.77 11/28/2017 0834   CALCIUM 8.8 (L) 11/28/2017 0834   GFRNONAA >60 11/28/2017 0834   GFRAA >60 11/28/2017 0834   CrCl cannot be calculated (Patient's most recent lab result is older than the maximum 21 days allowed.).  COAG Lab Results  Component Value Date   INR 1.05 11/18/2016   INR 1.23 10/23/2014    Radiology No results found.   Assessment/Plan 1. 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  The patient does not voice lifestyle limiting changes at this point in time.  Noninvasive studies do not suggest clinically significant change.  No invasive studies, angiography or surgery at this time The patient should continue walking and begin a more formal exercise program.  The patient should continue antiplatelet therapy and aggressive treatment of the lipid abnormalities  No changes in the patient's medications at this time  The patient should continue wearing graduated compression socks 10-15 mmHg strength to control the mild edema.    2. Deep vein thrombosis (DVT) of popliteal vein, unspecified chronicity, unspecified laterality (HCC) Recommend:   No surgery or intervention at this point in time.  IVC filter is not indicated at present.  Elevation was stressed, use of a recliner was discussed.  I have had a long discussion with the patient regarding DVT and post phlebitic changes  such as swelling and why it  causes symptoms such as pain.  The patient will wear graduated compression stockings class 1 (20-30 mmHg), beginning after three full days of anticoagulation, on a daily basis a prescription was given. The patient will  beginning wearing the stockings first thing in the morning and removing them in the evening. The patient is instructed specifically not to sleep in the stockings.  In addition, behavioral modification including elevation during the day and avoidance of prolonged dependency will be initiated.    The patient will continue anticoagulation for now as there have not been any problems or complications at this point.    3. Mixed hyperlipidemia Continue statin as ordered and reviewed, no changes at this time     Levora Dredge, MD  06/17/2019 2:42 PM

## 2019-06-20 ENCOUNTER — Encounter (INDEPENDENT_AMBULATORY_CARE_PROVIDER_SITE_OTHER): Payer: Self-pay | Admitting: Vascular Surgery

## 2019-06-30 ENCOUNTER — Other Ambulatory Visit (INDEPENDENT_AMBULATORY_CARE_PROVIDER_SITE_OTHER): Payer: Self-pay | Admitting: Vascular Surgery

## 2019-07-01 NOTE — Telephone Encounter (Signed)
Is it  ok to refill this at his last appointment her was on this medication but the note is from a year ago it only talks about him staying on blood thinner due to DVT.

## 2019-07-02 ENCOUNTER — Emergency Department
Admission: EM | Admit: 2019-07-02 | Discharge: 2019-07-02 | Disposition: A | Discharge status: Home / Self Care | Payer: Self-pay | Attending: Emergency Medicine | Admitting: Emergency Medicine

## 2019-07-02 ENCOUNTER — Other Ambulatory Visit: Payer: Self-pay

## 2019-07-02 ENCOUNTER — Encounter: Payer: Self-pay | Admitting: Emergency Medicine

## 2019-07-02 DIAGNOSIS — N4889 Other specified disorders of penis: Secondary | ICD-10-CM | POA: Insufficient documentation

## 2019-07-02 DIAGNOSIS — Z7901 Long term (current) use of anticoagulants: Secondary | ICD-10-CM | POA: Insufficient documentation

## 2019-07-02 DIAGNOSIS — Z79899 Other long term (current) drug therapy: Secondary | ICD-10-CM | POA: Insufficient documentation

## 2019-07-02 DIAGNOSIS — Z87891 Personal history of nicotine dependence: Secondary | ICD-10-CM | POA: Insufficient documentation

## 2019-07-02 DIAGNOSIS — Z86718 Personal history of other venous thrombosis and embolism: Secondary | ICD-10-CM | POA: Insufficient documentation

## 2019-07-02 NOTE — ED Provider Notes (Signed)
[facility] Emergency Department Provider Note   ____________________________________________   First MD Initiated Contact with Patient 07/02/19 [phone]     (approximate)  I have reviewed the triage vital signs and the nursing notes.   HISTORY  Chief Complaint lump on penis    HPI Thomas Hopkins is a 34 y.o. male patient presents with 2 to 3 days of a "bump" on penis.  Patient area is nontender.  Patient denies any urinary complaints with this lesion.         Past Medical History:  Diagnosis Date  . DVT of lower extremity, bilateral (HCC)    stopped Eliquis in 2017    Patient Active Problem List   Diagnosis Date Noted  . Morbid obesity (HCC) 06/17/2019  . 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 Hyperlipidemia 03/19/2017  . Atherosclerosis of aorta (HCC) 01/27/2017  . Ischemic foot pain at rest 11/18/2016  . Tobacco use disorder 05/04/2016  . Acute venous embolism and thrombosis of deep vessels of distal lower extremity (HCC) 10/22/2014  . Deep venous thrombosis (HCC) 10/22/2014  . RLQ abdominal pain   . Abdominal pain, acute, right lower quadrant 10/13/2014    Past Surgical History:  Procedure Laterality Date  . ABDOMINAL AORTOGRAM W/LOWER EXTREMITY Right 11/18/2016   Procedure: ABDOMINAL AORTOGRAM W/LOWER EXTREMITY;  Surgeon: Renford Dills, MD;  Location: [facility] INV[facility]IVE CV [facility];  Service: Cardiovascular;  Laterality: Right;  . LAPAROSCOPIC APPENDECTOMY N/A 10/14/2014   Procedure: APPENDECTOMY LAPAROSCOPIC;  Surgeon: Lattie Haw, MD;  Location: [facility] ORS;  Service: General;  Laterality: N/A;  . TONSILLECTOMY      Prior to Admission medications   Medication Sig Start Date End Date Taking? Authorizing Provider  acetaminophen (TYLENOL) 500 MG tablet Take 500 mg by mouth every 6 (six) hours as needed.    [provider]  amoxicillin (AMOXIL) 500 MG capsule Take 1 capsule (500 mg total) by mouth  3 (three) times daily. Patient not taking: Reported on 06/14/2018 08/06/17   Menshew, Charlesetta Ivory, PA-C  aspirin 325 MG tablet Take 325 mg by mouth daily.    [provider]  Aspirin-Acetaminophen-Caffeine [phone] MG PACK Take by mouth.    [provider]  atorvastatin (LIPITOR) 20 MG tablet TAKE 1 TABLET BY MOUTH EVERY DAY AFTER 6PM 07/01/19   Georgiana Spinner, NP  cephALEXin (KEFLEX) 500 MG capsule Take 1 capsule (500 mg total) by mouth 3 (three) times daily. Patient not taking: Reported on 05/11/2017 04/15/17   Faythe Ghee, PA-C  clopidogrel (PLAVIX) 75 MG tablet TAKE 1 TABLET BY MOUTH EVERY DAY 01/01/18   Schnier, Latina Craver, MD  cyclobenzaprine (FLEXERIL) 5 MG tablet Take 1 tablet (5 mg total) by mouth 3 (three) times daily as needed for muscle spasms. Patient not taking: Reported on 06/14/2018 08/06/17   Menshew, Charlesetta Ivory, PA-C  ibuprofen (ADVIL,MOTRIN) 600 MG tablet Take 1 tablet (600 mg total) by mouth every 6 (six) hours as needed. Patient not taking: Reported on 05/11/2017 03/23/17   Joni Reining, PA-C  pantoprazole (PROTONIX) 40 MG tablet Take 1 tablet (40 mg total) by mouth daily. Patient not taking: Reported on 06/14/2018 08/20/17 08/20/18  Arnaldo Natal, MD  predniSONE (DELTASONE) 10 MG tablet Take 3 tablets (30 mg total) by mouth daily with breakfast. Patient not taking: Reported on 05/11/2017 04/15/17   Faythe Ghee, PA-C  ranitidine (ZANTAC) 150 MG tablet Take 150 mg by mouth 2 (  two) times daily. 04/25/17   [provider]  sucralfate (CARAFATE) 1 g tablet Take 1 tablet (1 g total) by mouth 4 (four) times daily. Patient not taking: Reported on 06/14/2018 07/11/17 07/11/18  Myrna Blazer, MD    Allergies Bee venom, Vicodin [hydrocodone-acetaminophen], Hydrocodone-acetaminophen, Mushroom extract complex, and Olive oil  Family History  Problem Relation Age of Onset  . Diabetes Mother   . Diabetes Mellitus II Maternal Grandmother   . Rectal  cancer Father     Social History Social History   Tobacco Use  . Smoking status: Former Smoker    Packs/day: 1.00    Years: 15.00    Pack years: 15.00  . Smokeless tobacco: Never Used  Substance Use Topics  . Alcohol use: Not Currently  . Drug use: No    Review of Systems Constitutional: No fever/chills Eyes: No visual changes. ENT: No sore throat. Cardiovascular: Denies chest pain. Respiratory: Denies shortness of breath. Gastrointestinal: No abdominal pain.  No nausea, no vomiting.  No diarrhea.  No constipation. Genitourinary: Negative for dysuria. Musculoskeletal: Negative for back pain. Skin: Negative for rash. Neurological: Negative for headaches, focal weakness or numbness. Endocrine:  Hyperlipidemia. Allergic/Immunilogical: Bee stings, Vicodin, mushroom extract, and Elavil. ____________________________________________   PHYSICAL EXAM:  VITAL SIGNS: ED Triage Vitals [07/02/19 0924]  Enc Vitals Group     BP 128/85     Pulse Rate 67     Resp 16     Temp 98.7 F (37.1 C)     Temp Source Oral     SpO2 96 %     Weight 300 lb (136.1 kg)     Height 5\' 9"  (1.753 m)     Head Circumference      Peak Flow      Pain Score 0     Pain Loc      Pain Edu?      Excl. in GC?    Constitutional: Alert and oriented. Well appearing and in no acute distress.  Morbid obesity. Cardiovascular: Normal rate, regular rhythm. Grossly normal heart sounds.  Good peripheral circulation. Respiratory: Normal respiratory effort.  No retractions. Lungs CTAB. Gastrointestinal: Soft and nontender. No distention. No abdominal bruits. No CVA tenderness. Genitourinary: Cystic lesion dorsal aspect of penis.  Nontender to palpation. Skin:  Skin is warm, dry and intact. No rash noted. Psychiatric: Mood and affect are normal. Speech and behavior are normal.  ____________________________________________   LABS (all labs ordered are listed, but only abnormal results are displayed)  Labs  Reviewed - No data to display ____________________________________________  EKG   ____________________________________________  RADIOLOGY  ED MD interpretation:    Official radiology report(s): No results found.  ____________________________________________   PROCEDURES  Procedure(s) performed (including Critical Care):  Procedures   ____________________________________________   INITIAL IMPRESSION / ASSESSMENT AND PLAN / ED COURSE  As part of my medical decision making, I reviewed the following data within the electronic MEDICAL RECORD NUMBER     Patient presents with cystic lesion dorsal aspect of penis for 2 to 3 days.  Patient will be consulted to urology for definitive evaluation and treatment.    Thomas Hopkins was evaluated in Emergency Department on 07/02/2019 for the symptoms described in the history of present illness. He was evaluated in the context of the global COVID-19 pandemic, which necessitated consideration that the patient might be at risk for infection with the SARS-CoV-2 virus that causes COVID-19. Institutional protocols and algorithms that pertain to the evaluation of patients  at risk for COVID-19 are in a state of rapid change based on information released by regulatory bodies including the CDC and federal and state organizations. These policies and algorithms were followed during the patient's care in the ED.       ____________________________________________   FINAL CLINICAL IMPRESSION(S) / ED DIAGNOSES  Final diagnoses:  Penile cyst     ED Discharge Orders    None       Note:  This document was prepared using Dragon voice recognition software and may include unintentional dictation errors.    Sable Feil, PA-C 07/02/19 1010    Carrie Mew, MD 07/02/19 [phone]

## 2019-07-02 NOTE — ED Triage Notes (Signed)
Patient presents to the ED with a "lump" on his penis.  Patient states area is new in the past 2-3 days and is noticeable.  Patient denies any pain to area.  Patient is in no obvious distress at this time.

## 2019-07-02 NOTE — Discharge Instructions (Addendum)
Follow-up with urology for definitive evaluation of lesion.

## 2019-07-22 NOTE — Progress Notes (Incomplete)
07/23/19 10:14 PM   Thomas Hopkins 03-13-1986 [id number]  Referring provider: [facility] [place],  [place] [id number] No chief complaint on file.   HPI: Thomas Hopkins is a 34 y.o. M who presents today for the evaluation and management of a penile cyst.   Visited ED on 07/02/19 c/o of a "bump" on penis onset 2-3 days prior to visit. The area is nontender and he had no urinary complaints w/ this lesion.     PMH: Past Medical History:  Diagnosis Date  . DVT of lower extremity, bilateral ([place])    stopped Eliquis in 2017    Surgical History: Past Surgical History:  Procedure Laterality Date  . ABDOMINAL AORTOGRAM W/LOWER EXTREMITY Right 11/18/2016   Procedure: ABDOMINAL AORTOGRAM W/LOWER EXTREMITY;  Surgeon: Katha Cabal, MD;  Location: [facility];  Service: Cardiovascular;  Laterality: Right;  . LAPAROSCOPIC APPENDECTOMY N/A 10/14/2014   Procedure: APPENDECTOMY LAPAROSCOPIC;  Surgeon: Florene Glen, MD;  Location: [facility] ORS;  Service: General;  Laterality: N/A;  . TONSILLECTOMY      Home Medications:  Allergies as of 07/23/2019      Reactions   Bee Venom Hives   Vicodin [hydrocodone-acetaminophen] Nausea And Vomiting   Headache   Hydrocodone-acetaminophen Nausea And Vomiting   Headache   Mushroom Extract Complex Nausea And Vomiting   Olive Oil Nausea And Vomiting      Medication List       Accurate as of Jul 22, 2019 10:14 PM. If you have any questions, ask your nurse or doctor.        acetaminophen 500 MG tablet Commonly known as: TYLENOL Take 500 mg by mouth every 6 (six) hours as needed.   amoxicillin 500 MG capsule Commonly known as: AMOXIL Take 1 capsule (500 mg total) by mouth 3 (three) times daily.   aspirin 325 MG tablet Take 325 mg by mouth daily.   Aspirin-Acetaminophen-Caffeine 500-325-65 MG Pack Take by mouth.   atorvastatin 20 MG tablet Commonly known as: LIPITOR TAKE 1 TABLET BY MOUTH  EVERY DAY AFTER 6PM   cephALEXin 500 MG capsule Commonly known as: KEFLEX Take 1 capsule (500 mg total) by mouth 3 (three) times daily.   clopidogrel 75 MG tablet Commonly known as: PLAVIX TAKE 1 TABLET BY MOUTH EVERY DAY   cyclobenzaprine 5 MG tablet Commonly known as: FLEXERIL Take 1 tablet (5 mg total) by mouth 3 (three) times daily as needed for muscle spasms.   ibuprofen 600 MG tablet Commonly known as: ADVIL Take 1 tablet (600 mg total) by mouth every 6 (six) hours as needed.   pantoprazole 40 MG tablet Commonly known as: Protonix Take 1 tablet (40 mg total) by mouth daily.   predniSONE 10 MG tablet Commonly known as: DELTASONE Take 3 tablets (30 mg total) by mouth daily with breakfast.   ranitidine 150 MG tablet Commonly known as: ZANTAC Take 150 mg by mouth 2 (two) times daily.   sucralfate 1 g tablet Commonly known as: Carafate Take 1 tablet (1 g total) by mouth 4 (four) times daily.       Allergies:  Allergies  Allergen Reactions  . Bee Venom Hives  . Vicodin [Hydrocodone-Acetaminophen] Nausea And Vomiting    Headache  . Hydrocodone-Acetaminophen Nausea And Vomiting    Headache  . Mushroom Extract Complex Nausea And Vomiting  . Olive Oil Nausea And Vomiting    Family History: Family History  Problem Relation Age of Onset  .  Diabetes Mother   . Diabetes Mellitus II Maternal Grandmother   . Rectal cancer Father     Social History:  reports that he has quit smoking. He has a 15.00 pack-year smoking history. He has never used smokeless tobacco. He reports previous alcohol use. He reports that he does not use drugs.   Physical Exam: There were no vitals taken for this visit.  Constitutional:  Alert and oriented, No acute distress. HEENT: [place] AT, moist mucus membranes.  Trachea midline, no masses. Cardiovascular: No clubbing, cyanosis, or edema. Respiratory: Normal respiratory effort, no increased work of breathing. GI: Abdomen is soft, nontender,  nondistended, no abdominal masses GU: No CVA tenderness Lymph: No cervical or inguinal lymphadenopathy. Skin: No rashes, bruises or suspicious lesions. Neurologic: Grossly intact, no focal deficits, moving all 4 extremities. Psychiatric: Normal mood and affect.  Laboratory Data:  Urinalysis   Pertinent Imaging: *** Assessment & Plan:     No follow-ups on file.  [facility] [address], [address] [place], [place] [address] [phone]  I, Donne Hazel, am acting as a scribe for Dr. Vanna Scotland,  {Add Scribe Attestation Statement}

## 2019-07-23 ENCOUNTER — Encounter: Payer: Self-pay | Admitting: Urology

## 2019-07-23 ENCOUNTER — Ambulatory Visit: Payer: Self-pay | Admitting: Urology

## 2019-12-02 ENCOUNTER — Other Ambulatory Visit (INDEPENDENT_AMBULATORY_CARE_PROVIDER_SITE_OTHER): Payer: Self-pay | Admitting: Nurse Practitioner

## 2020-01-31 IMAGING — CT CT CTA ABD/PEL W/CM AND/OR W/O CM
3 of 9 series · 12 of 46 positions shown, 18 images · IV contrast (iopamidol)
Comparison: 04/10/2017

CLINICAL DATA: Abdominal and back pain.

EXAM:
CTA ABDOMEN AND PELVIS wITHOUT AND WITH CONTRAST
TECHNIQUE: Multidetector CT imaging of the abdomen and pelvis was performed
using the standard protocol during bolus administration of
intravenous contrast. Multiplanar reconstructed images and MIPs were
obtained and reviewed to evaluate the vascular anatomy.
CONTRAST:  125mL [id number] IOPAMIDOL ([id number]) INJECTION 76%

[Series 4: axial arterial · axial · arterial · 0.88mm/px · z∈[-913,-733]mm · 4 of 255 slices shown]
[im 30/255  soft-tissue]
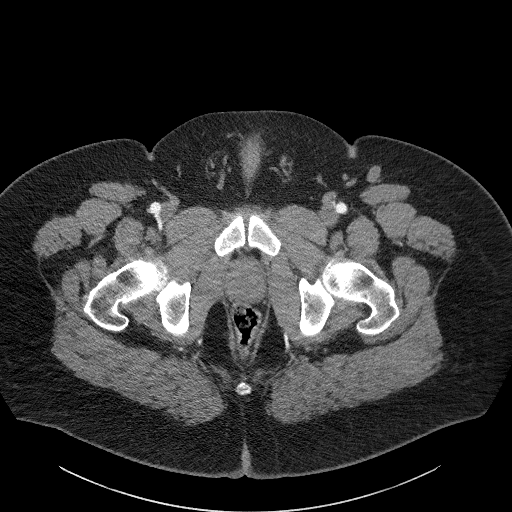
[im 60/255  soft-tissue]
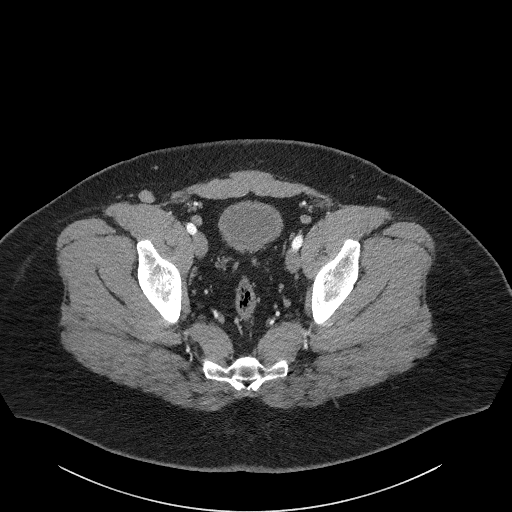
[im 90/255  soft-tissue]
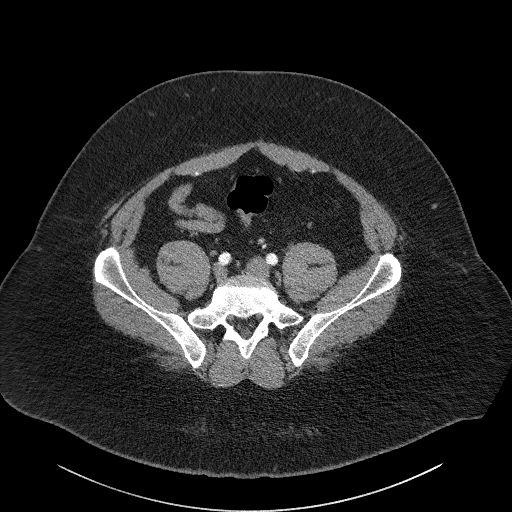
[im 120/255  soft-tissue]
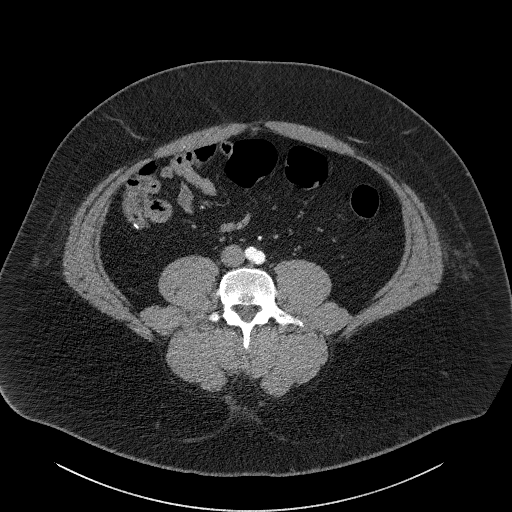

[Series 7: axial venous · axial · portal-venous · 0.88mm/px · z∈[-898,-538]mm · 6 of 102 slices shown, 11 images]
[im 15/102  soft-tissue]
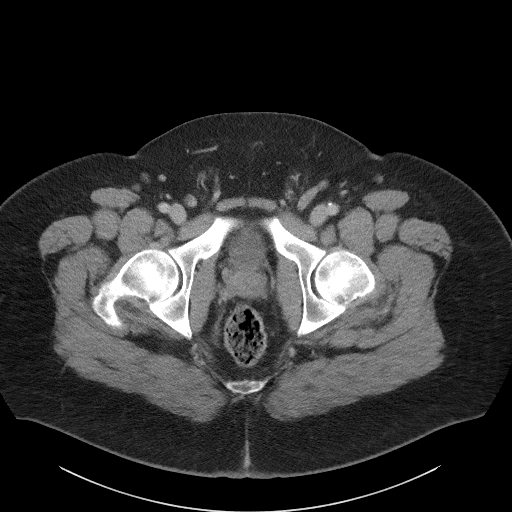
[im 15/102  bone]
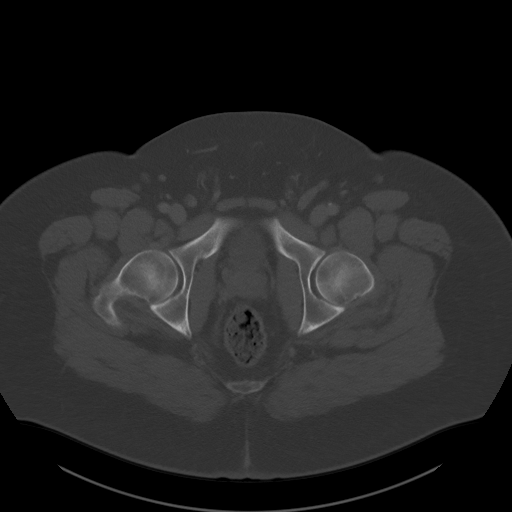
[im 29/102  soft-tissue]
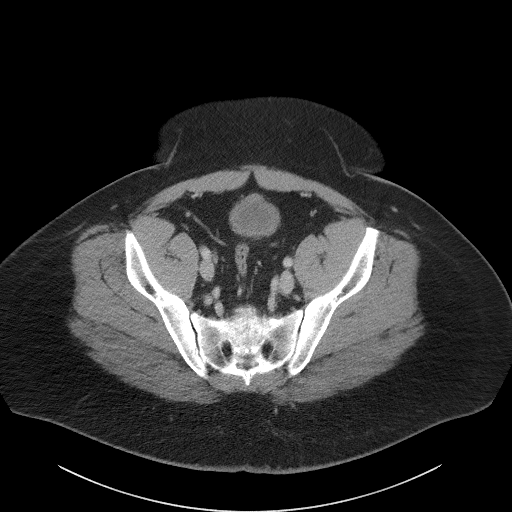
[im 44/102  soft-tissue]
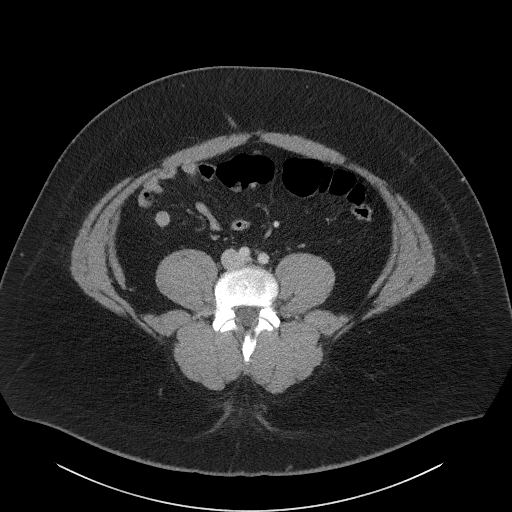
[im 44/102  lung]
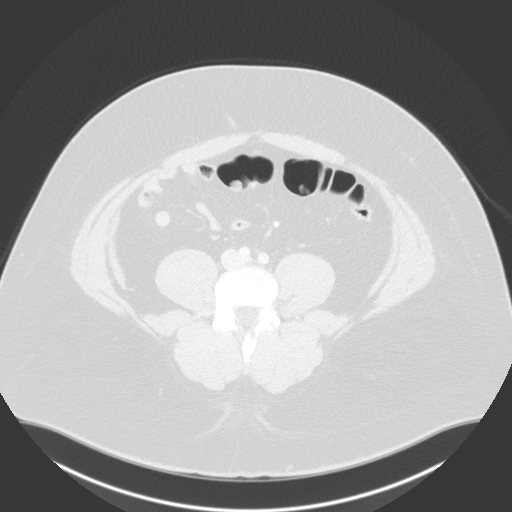
[im 58/102  soft-tissue]
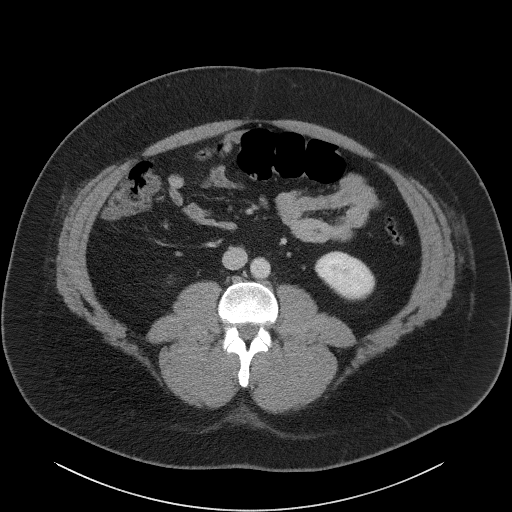
[im 58/102  lung]
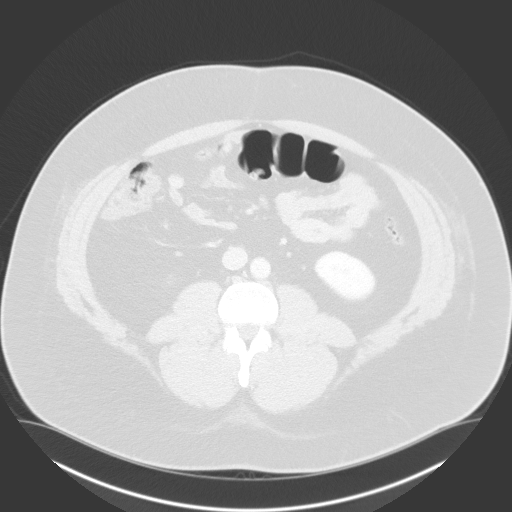
[im 73/102  soft-tissue]
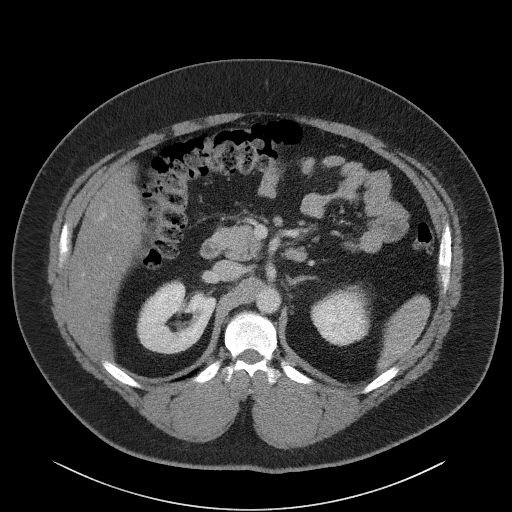
[im 73/102  lung]
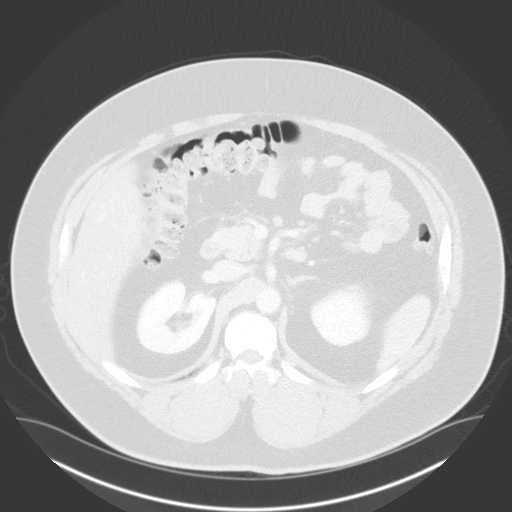
[im 87/102  soft-tissue]
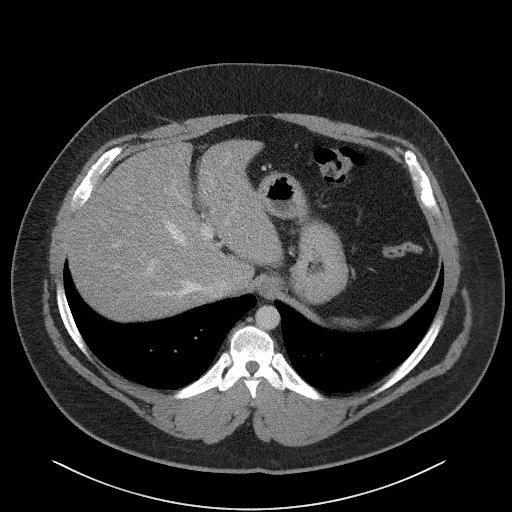
[im 87/102  lung]
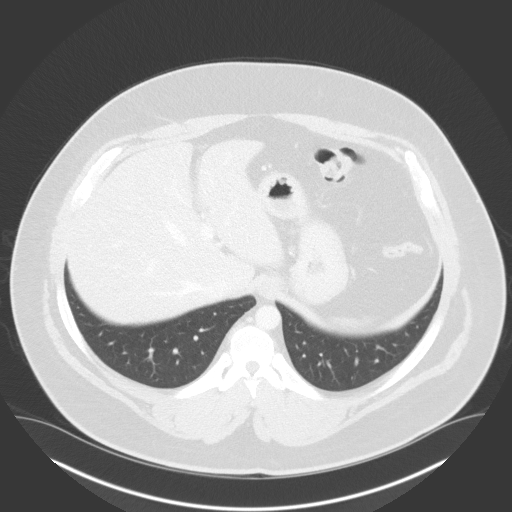

[Series 13: coronal venous mpr · coronal · portal-venous · 0.81mm/px · 2 of 134 slices shown, 3 images]
[im 45/134  soft-tissue]
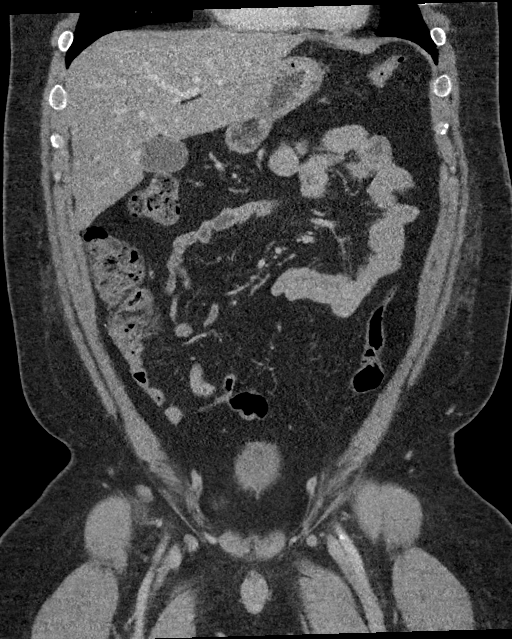
[im 45/134  bone]
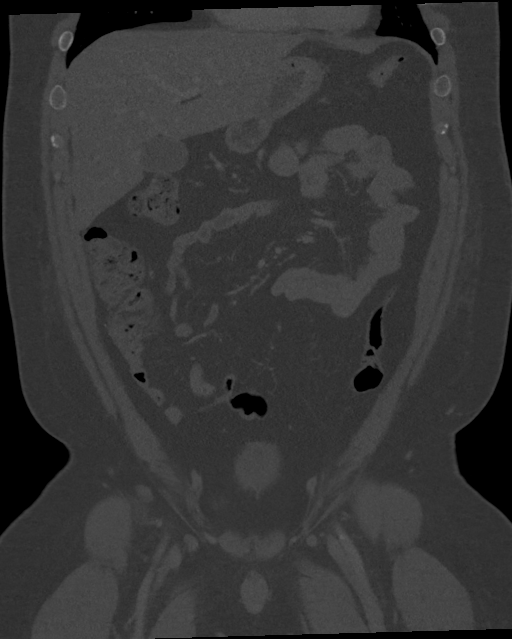
[im 89/134  soft-tissue]
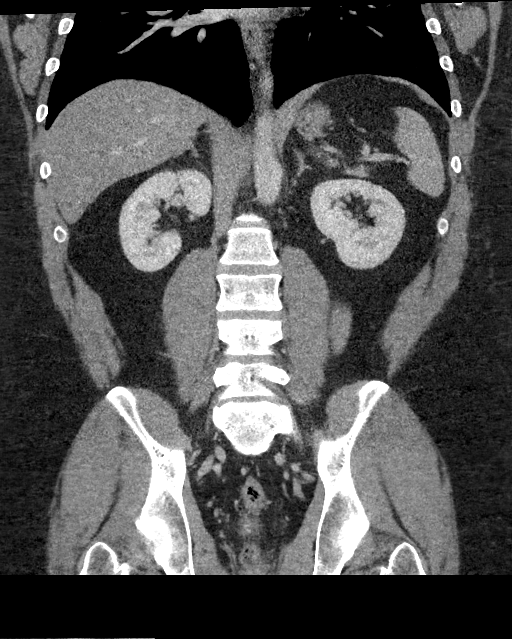

[12 of 46 positions shown; findings below may reference images not displayed]

FINDINGS: VASCULAR

Aorta: Minimal soft mural thrombus along the distal aorta is stable.
Aorta is nonaneurysmal. No luminal narrowing.

Celiac: Patent.  Branch vessels grossly patent.

SMA: Patent.

Renals: 2 right renal arteries and 2 left renal arteries are patent.

IMA: Patent.

Inflow: Bilateral common, internal, and external iliac arteries are
patent.

Proximal Outflow: Grossly patent.

Veins: No obvious DVT.

Review of the MIP images confirms the above findings.

NON-VASCULAR

Lower chest: No acute abnormality.

Hepatobiliary: Diffuse hepatic steatosis. Gallbladder is
unremarkable.

Pancreas: Unremarkable

Spleen: Unremarkable

Adrenals/Urinary Tract: Kidneys and adrenal glands are within normal
limits. Bladder is decompressed. Mild wall thickening of the bladder
is stable.

Stomach/Bowel: Stomach is decompressed. Duodenum is within normal
limits. No obvious mass in the colon. Post appendectomy staples. No
evidence of small-bowel obstruction.

Lymphatic: No abnormal retroperitoneal adenopathy. Several small
scattered mesenteric lymph nodes are visualized.

Reproductive: Prostate is within normal limits.

Other: No free fluid. Stable mild bilateral inguinal adenopathy.
cm left inguinal node on image 96 of series [DATE] cm right inguinal
node.

Musculoskeletal: No vertebral compression deformity.
IMPRESSION: VASCULAR

Stable minimal mural thrombus along the distal abdominal aorta.

No evidence of embolus in the visceral vasculature.

NON-VASCULAR

Stable bladder wall thickening.  Correlate with urinalysis.

Several small mesenteric lymph nodes are identified. Consider
mesenteric adenitis.

Stable mild bilateral inguinal adenopathy.

## 2020-04-02 ENCOUNTER — Other Ambulatory Visit (INDEPENDENT_AMBULATORY_CARE_PROVIDER_SITE_OTHER): Payer: Self-pay | Admitting: Vascular Surgery

## 2020-06-15 ENCOUNTER — Ambulatory Visit (INDEPENDENT_AMBULATORY_CARE_PROVIDER_SITE_OTHER): Payer: Medicaid Other | Admitting: Vascular Surgery

## 2020-12-06 ENCOUNTER — Emergency Department
Admission: EM | Admit: 2020-12-06 | Discharge: 2020-12-06 | Disposition: A | Discharge status: Home / Self Care | Payer: PRIVATE HEALTH INSURANCE | Attending: Emergency Medicine | Admitting: Emergency Medicine

## 2020-12-06 ENCOUNTER — Encounter: Payer: Self-pay | Admitting: Emergency Medicine

## 2020-12-06 ENCOUNTER — Emergency Department: Payer: PRIVATE HEALTH INSURANCE

## 2020-12-06 ENCOUNTER — Other Ambulatory Visit: Payer: Self-pay

## 2020-12-06 DIAGNOSIS — Z87891 Personal history of nicotine dependence: Secondary | ICD-10-CM | POA: Insufficient documentation

## 2020-12-06 DIAGNOSIS — Z7902 Long term (current) use of antithrombotics/antiplatelets: Secondary | ICD-10-CM | POA: Insufficient documentation

## 2020-12-06 DIAGNOSIS — M79641 Pain in right hand: Secondary | ICD-10-CM | POA: Diagnosis present

## 2020-12-06 DIAGNOSIS — M25521 Pain in right elbow: Secondary | ICD-10-CM | POA: Insufficient documentation

## 2020-12-06 DIAGNOSIS — Z7982 Long term (current) use of aspirin: Secondary | ICD-10-CM | POA: Insufficient documentation

## 2020-12-06 DIAGNOSIS — G5603 Carpal tunnel syndrome, bilateral upper limbs: Secondary | ICD-10-CM | POA: Insufficient documentation

## 2020-12-06 MED ORDER — MELOXICAM 15 MG PO TABS: 15.0000 mg | ORAL_TABLET | Freq: Every day | ORAL | 0 refills | Status: DC

## 2020-12-06 MED ORDER — MELOXICAM 7.5 MG PO TABS
15.0000 mg | ORAL_TABLET | Freq: Once | ORAL | Status: AC
  Administered 2020-12-06: 15 mg via ORAL
  Filled 2020-12-06: qty 2

## 2020-12-06 NOTE — ED Triage Notes (Signed)
Pt arrived via POV with reports of R elbow pain x 1 month worse recently, pt reports pain with movement and c/o pain with movement around the joint.  Pt also states bilateral hand pain x 1 month. Pt states he wakes up daily with numbness to bilateral hands. Pt reports he has a compressed disc in low back as well.

## 2020-12-06 NOTE — ED Provider Notes (Signed)
[facility] Provider Note  ____________________________________________  Time seen: Approximately 4:02 PM  I have reviewed the triage vital signs and the nursing notes.   HISTORY  Chief Complaint Elbow Pain and Hand Pain    HPI Thomas Hopkins is a 35 y.o. male who presents the emergency department complaining of pain to the right elbow, bilateral hands.  Patient states that he has been dealing with his hands for some time.  They wake him up in the middle of the night.  He does the same repetitive motions at work over and over every day.  No direct trauma to the hands.  Patient is also complaining of posterior elbow pain.  He states that he is not there until he goes to lift something heavy.  He states that it is always in the same spot along the lateral epicondyle region.  He had no trauma to this area.  No neck pain, shoulder pain.  No medications for his complaint prior to arrival.       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   Hyperlipidemia 03/19/2017   Atherosclerosis of aorta (HCC) 01/27/2017   Ischemic foot pain at rest 11/18/2016   Tobacco use disorder 05/04/2016   Acute venous embolism and thrombosis of deep vessels of distal lower extremity (HCC) 10/22/2014   Deep venous thrombosis (HCC) 10/22/2014   RLQ abdominal pain    Abdominal pain, acute, right lower quadrant 10/13/2014    Past Surgical History:  Procedure Laterality Date   ABDOMINAL AORTOGRAM W/LOWER EXTREMITY Right 11/18/2016   Procedure: ABDOMINAL AORTOGRAM W/LOWER EXTREMITY;  Surgeon: Renford Dills, MD;  Location: [facility];  Service: Cardiovascular;  Laterality: Right;   LAPAROSCOPIC APPENDECTOMY N/A 10/14/2014    Procedure: APPENDECTOMY LAPAROSCOPIC;  Surgeon: Lattie Haw, MD;  Location: [facility] ORS;  Service: General;  Laterality: N/A;   TONSILLECTOMY      Prior to Admission medications   Medication Sig Start Date End Date Taking? Authorizing Provider  meloxicam (MOBIC) 15 MG tablet Take 1 tablet (15 mg total) by mouth daily. 12/06/20  Yes Chanelle Hodsdon, Delorise Royals, PA-C  acetaminophen (TYLENOL) 500 MG tablet Take 500 mg by mouth every 6 (six) hours as needed.    [provider]  amoxicillin (AMOXIL) 500 MG capsule Take 1 capsule (500 mg total) by mouth 3 (three) times daily. Patient not taking: Reported on 06/14/2018 08/06/17   Menshew, Charlesetta Ivory, PA-C  aspirin 325 MG tablet Take 325 mg by mouth daily.    [provider]  Aspirin-Acetaminophen-Caffeine [phone] MG PACK Take by mouth.    [provider]  atorvastatin (LIPITOR) 20 MG tablet TAKE 1 TABLET BY MOUTH EVERY DAY AFTER 6PM 07/01/19   Georgiana Spinner, NP  cephALEXin (KEFLEX) 500 MG capsule Take 1 capsule (500 mg total) by mouth 3 (three) times daily. Patient not taking: Reported on 05/11/2017 04/15/17   Faythe Ghee, PA-C  clopidogrel (PLAVIX) 75 MG tablet TAKE 1 TABLET BY MOUTH EVERY DAY 04/02/20   Schnier, Latina Craver, MD  cyclobenzaprine (FLEXERIL) 5 MG tablet Take 1 tablet (5 mg total) by mouth 3 (three) times daily as needed for muscle spasms. Patient not taking: Reported on 06/14/2018 08/06/17  Menshew, Charlesetta Ivory, PA-C  ibuprofen (ADVIL,MOTRIN) 600 MG tablet Take 1 tablet (600 mg total) by mouth every 6 (six) hours as needed. Patient not taking: Reported on 05/11/2017 03/23/17   Joni Reining, PA-C  pantoprazole (PROTONIX) 40 MG tablet Take 1 tablet (40 mg total) by mouth daily. Patient not taking: Reported on 06/14/2018 08/20/17 08/20/18  Arnaldo Natal, MD  predniSONE (DELTASONE) 10 MG tablet Take 3 tablets (30 mg total) by mouth daily with breakfast. Patient not taking: Reported on 05/11/2017 04/15/17   Faythe Ghee, PA-C  ranitidine (ZANTAC) 150 MG tablet Take 150 mg by mouth 2 (two) times daily. 04/25/17   [provider]  sucralfate (CARAFATE) 1 g tablet Take 1 tablet (1 g total) by mouth 4 (four) times daily. Patient not taking: Reported on 06/14/2018 07/11/17 07/11/18  Schaevitz, Myra Rude, MD    Allergies Bee venom, Vicodin [hydrocodone-acetaminophen], Hydrocodone-acetaminophen, Mushroom extract complex, and Olive oil  Family History  Problem Relation Age of Onset   Diabetes Mother    Diabetes Mellitus II Maternal Grandmother    Rectal cancer Father     Social History Social History   Tobacco Use   Smoking status: Former    Packs/day: 1.00    Years: 15.00    Pack years: 15.00    Types: Cigarettes   Smokeless tobacco: Never  Substance Use Topics   Alcohol use: Not Currently   Drug use: No     Review of Systems  Constitutional: No fever/chills Eyes: No visual changes. No discharge ENT: No upper respiratory complaints. Cardiovascular: no chest pain. Respiratory: no cough. No SOB. Gastrointestinal: No abdominal pain.  No nausea, no vomiting.  No diarrhea.  No constipation. Musculoskeletal: Intermittent elbow pain with lifting objects, intermittent bilateral hand pain Skin: Negative for rash, abrasions, lacerations, ecchymosis. Neurological: Negative for headaches, focal weakness or numbness.  10 System ROS otherwise negative.  ____________________________________________   PHYSICAL EXAM:  VITAL SIGNS: ED Triage Vitals  Enc Vitals Group     BP 12/06/20 1453 133/71     Pulse Rate 12/06/20 1453 69     Resp 12/06/20 1453 18     Temp 12/06/20 1453 98.7 F (37.1 C)     Temp Source 12/06/20 1453 Oral     SpO2 12/06/20 1453 96 %     Weight 12/06/20 1453 292 lb 3.2 oz (132.5 kg)     Height 12/06/20 1453 5\' 9"  (1.753 m)     Head Circumference --      Peak Flow --      Pain Score 12/06/20 1501 7     Pain Loc --      Pain Edu? --      Excl. in GC? --       Constitutional: Alert and oriented. Well appearing and in no acute distress. Eyes: Conjunctivae are normal. PERRL. EOMI. Head: Atraumatic. ENT:      Ears:       Nose: No congestion/rhinnorhea.      Mouth/Throat: Mucous membranes are moist.  Neck: No stridor.    Cardiovascular: Normal rate, regular rhythm. Normal S1 and S2.  Good peripheral circulation. Respiratory: Normal respiratory effort without tachypnea or retractions. Lungs CTAB. Good air entry to the bases with no decreased or absent breath sounds. Musculoskeletal: Full range of motion to all extremities. No gross deformities appreciated.  Visualization of the elbow reveals no visible signs of trauma.  No edema, ecchymosis.  No open wounds.  Good range of motion  at this time.  Patient is tender to palpation over the lateral elbow roughly at the site of the radial nerve distribution.  Deep palpation of this area elicits same response of pain.  Good range of motion to the shoulder, elbow, wrist.  Sensation capillary refill intact bilateral upper extremities.  Patient was also complaining of bilateral intermittent hand pain.  Negative for Tinel's but positive for Phalen's. Neurologic:  Normal speech and language. No gross focal neurologic deficits are appreciated.  Skin:  Skin is warm, dry and intact. No rash noted. Psychiatric: Mood and affect are normal. Speech and behavior are normal. Patient exhibits appropriate insight and judgement.   ____________________________________________   LABS (all labs ordered are listed, but only abnormal results are displayed)  Labs Reviewed - No data to display ____________________________________________  EKG   ____________________________________________  RADIOLOGY I personally viewed and evaluated these images as part of my medical decision making, as well as reviewing the written report by the radiologist.  ED Provider Interpretation: No acute traumatic findings about the right  elbow.  Also no evidence of joint effusion.  DG Elbow Complete Right  Result Date: 12/06/2020 CLINICAL DATA:  Right elbow pain for 1 month without reported injury. EXAM: RIGHT ELBOW - COMPLETE 3+ VIEW COMPARISON:  None. FINDINGS: There is no evidence of fracture, dislocation, or joint effusion. There is no evidence of arthropathy or other focal bone abnormality. Soft tissues are unremarkable. IMPRESSION: Negative. Electronically Signed   By: Lupita Raider M.D.   On: 12/06/2020 15:35    ____________________________________________    PROCEDURES  Procedure(s) performed:    Procedures    Medications  meloxicam (MOBIC) tablet 15 mg (has no administration in time range)     ____________________________________________   INITIAL IMPRESSION / ASSESSMENT AND PLAN / ED COURSE  Pertinent labs & imaging results that were available during my care of the patient were reviewed by me and considered in my medical decision making (see chart for details).  Review of the [place] CSRS was performed in accordance of the [facility] prior to dispensing any controlled drugs.           Patient's diagnosis is consistent with right elbow pain, carpal tunnel syndrome.  Patient presents emergency department complaining of intermittent pain in his both hands.  This is been ongoing for some time.  It will wake him up during sleep.  He is positive for Phalen's.  In this time patient will have Velcro braces provided to him, anti-inflammatory for likely carpal tunnel syndrome.  Patient is also having some pain with lifting of anything heavy with the right arm.  Pain is localized over the radial nerve distribution about the elbow.  There is no abnormal findings on x-ray.  No loss of sensation, no erythema or edema of the joint to be concern for infection.  At this time I feel that this is likely an impingement of the nerve at this level.  Again we will treat with anti-inflammatory.  If symptoms persist follow-up with  orthopedics..  Patient is given ED precautions to return to the ED for any worsening or new symptoms.     ____________________________________________  FINAL CLINICAL IMPRESSION(S) / ED DIAGNOSES  Final diagnoses:  Bilateral carpal tunnel syndrome  Right elbow pain      NEW MEDICATIONS STARTED DURING THIS VISIT:  ED Discharge Orders          Ordered    meloxicam (MOBIC) 15 MG tablet  Daily  12/06/20 1626                This chart was dictated using voice recognition software/Dragon. Despite best efforts to proofread, errors can occur which can change the meaning. Any change was purely unintentional.    Navy, [place], PA-C 12/06/20 1626    Shaune Pollack, MD 12/07/20 [phone]

## 2021-01-25 DIAGNOSIS — J101 Influenza due to other identified influenza virus with other respiratory manifestations: Secondary | ICD-10-CM | POA: Insufficient documentation

## 2021-01-25 DIAGNOSIS — Z87891 Personal history of nicotine dependence: Secondary | ICD-10-CM | POA: Diagnosis not present

## 2021-01-25 DIAGNOSIS — Z7982 Long term (current) use of aspirin: Secondary | ICD-10-CM | POA: Diagnosis not present

## 2021-01-25 DIAGNOSIS — Z20822 Contact with and (suspected) exposure to covid-19: Secondary | ICD-10-CM | POA: Insufficient documentation

## 2021-01-25 DIAGNOSIS — Z7902 Long term (current) use of antithrombotics/antiplatelets: Secondary | ICD-10-CM | POA: Diagnosis not present

## 2021-01-25 DIAGNOSIS — R509 Fever, unspecified: Secondary | ICD-10-CM | POA: Diagnosis present

## 2021-01-26 ENCOUNTER — Emergency Department
Admission: EM | Admit: 2021-01-26 | Discharge: 2021-01-26 | Disposition: A | Discharge status: Home / Self Care | Payer: PRIVATE HEALTH INSURANCE | Attending: Emergency Medicine | Admitting: Emergency Medicine

## 2021-01-26 ENCOUNTER — Other Ambulatory Visit: Payer: Self-pay

## 2021-01-26 ENCOUNTER — Encounter: Payer: Self-pay | Admitting: *Deleted

## 2021-01-26 DIAGNOSIS — J101 Influenza due to other identified influenza virus with other respiratory manifestations: Secondary | ICD-10-CM

## 2021-01-26 LAB — RESP PANEL BY RT-PCR (FLU A&B, COVID) ARPGX2
Influenza A by PCR: POSITIVE — AB
Influenza B by PCR: NEGATIVE
SARS Coronavirus 2 by RT PCR: NEGATIVE

## 2021-01-26 MED ORDER — OSELTAMIVIR PHOSPHATE 75 MG PO CAPS
75.0000 mg | ORAL_CAPSULE | Freq: Once | ORAL | Status: DC
  Filled 2021-01-26: qty 1

## 2021-01-26 MED ORDER — ONDANSETRON 4 MG PO TBDP: 4.0000 mg | ORAL_TABLET | Freq: Three times a day (TID) | ORAL | 0 refills | Status: DC | PRN

## 2021-01-26 MED ORDER — OSELTAMIVIR PHOSPHATE 75 MG PO CAPS: 75.0000 mg | ORAL_CAPSULE | Freq: Two times a day (BID) | ORAL | 0 refills | Status: AC

## 2021-01-26 MED ORDER — ACETAMINOPHEN 500 MG PO TABS
1000.0000 mg | ORAL_TABLET | Freq: Once | ORAL | Status: AC
  Administered 2021-01-26: 1000 mg via ORAL
  Filled 2021-01-26: qty 2

## 2021-01-26 NOTE — Discharge Instructions (Addendum)
Follow up with your doctor in 2 days. Take tylenol 1000mg  every 8 hours for body aches and fever. Take zofran as needed for nausea. Take tamiflu as prescribed. Return to the ER for chest pain or shortness of breath

## 2021-01-26 NOTE — ED Provider Notes (Signed)
[facility] Emergency Department Provider Note  ____________________________________________  Time seen: Approximately 2:48 AM  I have reviewed the triage vital signs and the nursing notes.   HISTORY  Chief Complaint Headache and Fever   HPI WINFIELD CABA is a 35 y.o. male with a history of peripheral artery disease on Plavix, provoked DVT, obesity, smoking, hyperlipidemia who presents for evaluation of flulike symptoms.  Patient has had 3 days of headache, generalized body aches, chills, fever, sore throat, nausea, and a mild cough.  No chest pain or shortness of breath, no wheezing, no leg pain or swelling, no hemoptysis.  No vomiting or diarrhea.  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   Hyperlipidemia 03/19/2017   Atherosclerosis of aorta (HCC) 01/27/2017   Ischemic foot pain at rest 11/18/2016   Tobacco use disorder 05/04/2016   Acute venous embolism and thrombosis of deep vessels of distal lower extremity (HCC) 10/22/2014   Deep venous thrombosis (HCC) 10/22/2014   RLQ abdominal pain    Abdominal pain, acute, right lower quadrant 10/13/2014    Past Surgical History:  Procedure Laterality Date   ABDOMINAL AORTOGRAM W/LOWER EXTREMITY Right 11/18/2016   Procedure: ABDOMINAL AORTOGRAM W/LOWER EXTREMITY;  Surgeon: Renford Dills, MD;  Location: [facility] INVASIVE CV [facility];  Service: Cardiovascular;  Laterality: Right;   LAPAROSCOPIC APPENDECTOMY N/A 10/14/2014   Procedure: APPENDECTOMY LAPAROSCOPIC;  Surgeon: Lattie Haw, MD;  Location: [facility] ORS;  Service: General;  Laterality: N/A;   TONSILLECTOMY      Prior to Admission medications   Medication Sig Start Date End Date Taking? Authorizing Provider  ondansetron  (ZOFRAN ODT) 4 MG disintegrating tablet Take 1 tablet (4 mg total) by mouth every 8 (eight) hours as needed. 01/26/21  Yes Don Perking, [place], MD  oseltamivir (TAMIFLU) 75 MG capsule Take 1 capsule (75 mg total) by mouth 2 (two) times daily for 5 days. 01/26/21 01/31/21 Yes Lucianne Smestad, [place], MD  acetaminophen (TYLENOL) 500 MG tablet Take 500 mg by mouth every 6 (six) hours as needed.    [provider]  amoxicillin (AMOXIL) 500 MG capsule Take 1 capsule (500 mg total) by mouth 3 (three) times daily. Patient not taking: Reported on 06/14/2018 08/06/17   Menshew, Charlesetta Ivory, PA-C  aspirin 325 MG tablet Take 325 mg by mouth daily.    [provider]  Aspirin-Acetaminophen-Caffeine [phone] MG PACK Take by mouth.    [provider]  atorvastatin (LIPITOR) 20 MG tablet TAKE 1 TABLET BY MOUTH EVERY DAY AFTER 6PM 07/01/19   Georgiana Spinner, NP  cephALEXin (KEFLEX) 500 MG capsule Take 1 capsule (500 mg total) by mouth 3 (three) times daily. Patient not taking: Reported on 05/11/2017 04/15/17   Faythe Ghee, PA-C  clopidogrel (PLAVIX) 75 MG tablet TAKE 1 TABLET BY MOUTH EVERY DAY 04/02/20   Schnier, Latina Craver, MD  cyclobenzaprine (FLEXERIL) 5 MG tablet Take 1 tablet (5 mg total) by mouth 3 (three) times daily as needed for muscle spasms. Patient not taking: Reported on 06/14/2018 08/06/17   Menshew, Charlesetta Ivory, PA-C  ibuprofen (ADVIL,MOTRIN) 600 MG tablet Take 1 tablet (600 mg total) by mouth every 6 (six) hours as needed. Patient not taking: Reported on 05/11/2017 03/23/17  Joni Reining, PA-C  meloxicam (MOBIC) 15 MG tablet Take 1 tablet (15 mg total) by mouth daily. 12/06/20   Cuthriell, Delorise Royals, PA-C  pantoprazole (PROTONIX) 40 MG tablet Take 1 tablet (40 mg total) by mouth daily. Patient not taking: Reported on 06/14/2018 08/20/17 08/20/18  Arnaldo Natal, MD  predniSONE (DELTASONE) 10 MG tablet Take 3 tablets (30 mg total) by mouth daily with breakfast. Patient not  taking: Reported on 05/11/2017 04/15/17   Faythe Ghee, PA-C  ranitidine (ZANTAC) 150 MG tablet Take 150 mg by mouth 2 (two) times daily. 04/25/17   [provider]  sucralfate (CARAFATE) 1 g tablet Take 1 tablet (1 g total) by mouth 4 (four) times daily. Patient not taking: Reported on 06/14/2018 07/11/17 07/11/18  Myrna Blazer, MD    Allergies Bee venom, Vicodin [hydrocodone-acetaminophen], Hydrocodone-acetaminophen, Mushroom extract complex, and Olive oil  Family History  Problem Relation Age of Onset   Diabetes Mother    Diabetes Mellitus II Maternal Grandmother    Rectal cancer Father     Social History Social History   Tobacco Use   Smoking status: Former    Packs/day: 1.00    Years: 15.00    Pack years: 15.00    Types: Cigarettes   Smokeless tobacco: Never  Substance Use Topics   Alcohol use: Not Currently   Drug use: No    Review of Systems  Constitutional: + fever, chills, body aches Eyes: Negative for visual changes. ENT: + sore throat. Neck: No neck pain  Cardiovascular: Negative for chest pain. Respiratory: Negative for shortness of breath. + cough Gastrointestinal: Negative for abdominal pain, vomiting or diarrhea. Genitourinary: Negative for dysuria. Musculoskeletal: Negative for back pain. Skin: Negative for rash. Neurological: Negative for headaches, weakness or numbness. Psych: No SI or HI  ____________________________________________   PHYSICAL EXAM:  VITAL SIGNS: Vitals:   01/25/21 2354 01/26/21 0227  BP: 130/89 132/78  Pulse: (!) 106 84  Resp: 20 20  Temp: (!) 100.5 F (38.1 C) 100 F (37.8 C)  SpO2: 95% 95%    Constitutional: Alert and oriented. Well appearing and in no apparent distress. HEENT:      Head: Normocephalic and atraumatic.         Eyes: Conjunctivae are normal. Sclera is non-icteric.       Mouth/Throat: Mucous membranes are moist.       Neck: Supple with no signs of meningismus. Cardiovascular:  Regular rate and rhythm. No murmurs, gallops, or rubs. 2+ symmetrical distal pulses are present in all extremities. No JVD. Respiratory: Normal respiratory effort. Lungs are clear to auscultation bilaterally.  Gastrointestinal: Soft, non tender, and non distended with positive bowel sounds. No rebound or guarding. Genitourinary: No CVA tenderness. Musculoskeletal:  No edema, cyanosis, or erythema of extremities. Neurologic: Normal speech and language. Face is symmetric. Moving all extremities. No gross focal neurologic deficits are appreciated. Skin: Skin is warm, dry and intact. No rash noted. Psychiatric: Mood and affect are normal. Speech and behavior are normal.  ____________________________________________   LABS (all labs ordered are listed, but only abnormal results are displayed)  Labs Reviewed  RESP PANEL BY RT-PCR (FLU A&B, COVID) ARPGX2 - Abnormal; Notable for the following components:      Result Value   Influenza A by PCR POSITIVE (*)    All other components within normal limits   ____________________________________________  EKG  none  ____________________________________________  RADIOLOGY  none  ____________________________________________   PROCEDURES  Procedure(s) performed: None Procedures  Critical Care performed:  None ____________________________________________   INITIAL IMPRESSION / ASSESSMENT AND PLAN / ED COURSE  35 y.o. male with a history of peripheral artery disease on Plavix, provoked DVT, obesity, smoking, hyperlipidemia who presents for evaluation of flulike symptoms.  Patient is well-appearing in no distress with low-grade fever, normal work of breathing normal sats, no tachycardia, lungs are clear to auscultation.  Looks well-hydrated.  Positive for influenza A.  No chest pain or shortness of breath.  Discussed risk and benefits of Tamiflu and prescription was provided.  Recommended Tylenol for body aches, headache, and fever.  Will give  Zofran for nausea.  Recommended increase oral hydration and follow-up with PCP in 2 days for reevaluation of vital signs.  Recommended return to the emergency room for chest pain or shortness of breath      _____________________________________________ Please note:  Patient was evaluated in Emergency Department today for the symptoms described in the history of present illness. Patient was evaluated in the context of the global COVID-19 pandemic, which necessitated consideration that the patient might be at risk for infection with the SARS-CoV-2 virus that causes COVID-19. Institutional protocols and algorithms that pertain to the evaluation of patients at risk for COVID-19 are in a state of rapid change based on information released by regulatory bodies including the CDC and federal and state organizations. These policies and algorithms were followed during the patient's care in the ED.  Some ED evaluations and interventions may be delayed as a result of limited staffing during the pandemic.   [place] Controlled Substance Database was reviewed by me. ____________________________________________   FINAL CLINICAL IMPRESSION(S) / ED DIAGNOSES   Final diagnoses:  Influenza A      NEW MEDICATIONS STARTED DURING THIS VISIT:  ED Discharge Orders          Ordered    ondansetron (ZOFRAN ODT) 4 MG disintegrating tablet  Every 8 hours PRN        01/26/21 0244    oseltamivir (TAMIFLU) 75 MG capsule  2 times daily        01/26/21 0244             Note:  This document was prepared using Dragon voice recognition software and may include unintentional dictation errors.    Nita Sickle, MD 01/26/21 [phone]

## 2021-01-26 NOTE — ED Triage Notes (Signed)
Pt to ED reporting headache, nausea and cough with a sore throat for the past three days. Last dose of tylenol was 2 hours ago.

## 2021-02-01 ENCOUNTER — Ambulatory Visit (INDEPENDENT_AMBULATORY_CARE_PROVIDER_SITE_OTHER): Payer: Medicaid Other | Admitting: Vascular Surgery

## 2021-02-08 ENCOUNTER — Other Ambulatory Visit: Payer: Self-pay

## 2021-02-08 ENCOUNTER — Ambulatory Visit (INDEPENDENT_AMBULATORY_CARE_PROVIDER_SITE_OTHER): Payer: PRIVATE HEALTH INSURANCE | Admitting: Vascular Surgery

## 2021-02-08 VITALS — BP 121/82 | HR 85 | Ht 69.0 in | Wt 303.0 lb

## 2021-02-08 DIAGNOSIS — E782 Mixed hyperlipidemia: Secondary | ICD-10-CM | POA: Diagnosis not present

## 2021-02-08 DIAGNOSIS — I743 Embolism and thrombosis of arteries of the lower extremities: Secondary | ICD-10-CM

## 2021-02-08 NOTE — Progress Notes (Signed)
MRN : [id number]  Thomas Hopkins is a 35 y.o. (04-Jan-1986) male who presents with chief complaint of check leg.  History of Present Illness:   The patient returns to the office for follow up.  There has been no further episodes of embolization.  There have been no interval changes in lower extremity symptoms. No interval shortening of the patient's claudication distance or development of rest pain symptoms. No new ulcers or wounds have occurred since the last visit.  No new symptoms to suggest he has had another embolism   The previous groin pain has resolved.   Historically:  He is s/p right leg thromboembolectomy of the tibial vessels and angioplaty of the PT on 11/18/2016.  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   There have been no significant changes to the patient's overall health care.     The patient denies amaurosis fugax or recent TIA symptoms. There are no recent neurological changes noted. The patient has history of DVT but no PE or superficial thrombophlebitis.   Previous CT shows resolution of the distal aortic thrombus with preservation of the iliofemoral and popliteal system  No outpatient medications have been marked as taking for the 02/08/21 encounter (Appointment) with Gilda Crease, Latina Craver, MD.    Past Medical History:  Diagnosis Date   DVT of lower extremity, bilateral (HCC)    stopped Eliquis in 2017    Past Surgical History:  Procedure Laterality Date   ABDOMINAL AORTOGRAM W/LOWER EXTREMITY Right 11/18/2016   Procedure: ABDOMINAL AORTOGRAM W/LOWER EXTREMITY;  Surgeon: Renford Dills, MD;  Location: [facility];  Service: Cardiovascular;  Laterality: Right;   LAPAROSCOPIC APPENDECTOMY N/A 10/14/2014   Procedure: APPENDECTOMY LAPAROSCOPIC;  Surgeon: Lattie Haw, MD;  Location: [facility] ORS;  Service: General;  Laterality: N/A;   TONSILLECTOMY      Social History Social History   Tobacco Use   Smoking  status: Former    Packs/day: 1.00    Years: 15.00    Pack years: 15.00    Types: Cigarettes   Smokeless tobacco: Never  Substance Use Topics   Alcohol use: Not Currently   Drug use: No    Family History Family History  Problem Relation Age of Onset   Diabetes Mother    Diabetes Mellitus II Maternal Grandmother    Rectal cancer Father     Allergies  Allergen Reactions   Bee Venom Hives   Vicodin [Hydrocodone-Acetaminophen] Nausea And Vomiting    Headache   Hydrocodone-Acetaminophen Nausea And Vomiting    Headache   Mushroom Extract Complex Nausea And Vomiting   Olive Oil Nausea And Vomiting     REVIEW OF SYSTEMS (Negative unless checked)  Constitutional: [] Weight loss  [] Fever  [] Chills Cardiac: [] Chest pain   [] Chest pressure   [] Palpitations   [] Shortness of breath when laying flat   [] Shortness of breath with exertion. Vascular:  [] Pain in legs with walking   [] Pain in legs at rest  [] History of DVT   [] Phlebitis   [] Swelling in legs   [] Varicose veins   [] Non-healing ulcers Pulmonary:   [] Uses home oxygen   [] Productive cough   [] Hemoptysis   [] Wheeze  [] COPD   [] Asthma Neurologic:  [] Dizziness   [] Seizures   [] History of stroke   [] History of TIA  [] Aphasia   [] Vissual changes   [] Weakness or numbness in arm   [] Weakness or numbness in leg Musculoskeletal:   [] Joint swelling   []   Joint pain   [] Low back pain Hematologic:  [] Easy bruising  [] Easy bleeding   [] Hypercoagulable state   [] Anemic Gastrointestinal:  [] Diarrhea   [] Vomiting  [] Gastroesophageal reflux/heartburn   [] Difficulty swallowing. Genitourinary:  [] Chronic kidney disease   [] Difficult urination  [] Frequent urination   [] Blood in urine Skin:  [] Rashes   [] Ulcers  Psychological:  [] History of anxiety   []  History of major depression.  Physical Examination  There were no vitals filed for this visit. There is no height or weight on file to calculate BMI. Gen: WD/WN, NAD Head: [place]/AT, No temporalis  wasting.  Ear/Nose/Throat: Hearing grossly intact, nares w/o erythema or drainage Eyes: PER, EOMI, sclera nonicteric.  Neck: Supple, no masses.  No bruit or JVD.  Pulmonary:  Good air movement, no audible wheezing, no use of accessory muscles.  Cardiac: RRR, normal S1, S2, no Murmurs. Vascular:   Vessel Right Left  Radial Palpable Palpable  PT Palpable Palpable  DP Palpable Palpable  Gastrointestinal: soft, non-distended. No guarding/no peritoneal signs.  Musculoskeletal: M/S 5/5 throughout.  No visible deformity.  Neurologic: CN 2-12 intact. Pain and light touch intact in extremities.  Symmetrical.  Speech is fluent. Motor exam as listed above. Psychiatric: Judgment intact, Mood & affect appropriate for pt's clinical situation. Dermatologic: No rashes or ulcers noted.  No changes consistent with cellulitis.   CBC Lab Results  Component Value Date   WBC 8.3 11/28/2017   HGB 15.2 11/28/2017   HCT 44.4 11/28/2017   MCV 88.9 11/28/2017   PLT 281 11/28/2017    BMET    Component Value Date/Time   NA 140 11/28/2017 0834   K 4.1 11/28/2017 0834   CL 108 11/28/2017 0834   CO2 26 11/28/2017 0834   GLUCOSE 94 11/28/2017 0834   BUN 13 11/28/2017 0834   CREATININE 0.77 11/28/2017 0834   CALCIUM 8.8 (L) 11/28/2017 0834   GFRNONAA >60 11/28/2017 0834   GFRAA >60 11/28/2017 0834   CrCl cannot be calculated (Patient's most recent lab result is older than the maximum 21 days allowed.).  COAG Lab Results  Component Value Date   INR 1.05 11/18/2016   INR 1.23 10/23/2014    Radiology No results found.   Assessment/Plan 1. 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  The patient does not voice lifestyle limiting changes at this point in time.   Noninvasive studies do not suggest clinically significant change.   No invasive studies, angiography or surgery at  this time The patient should continue walking and begin a more formal exercise program.  The patient should continue antiplatelet therapy and aggressive treatment of the lipid abnormalities   No changes in the patient's medications at this time   The patient should continue wearing graduated compression socks 10-15 mmHg strength to control the mild edema.   2. Mixed hyperlipidemia Continue statin as ordered and reviewed, no changes at this time     11/30/2017, MD  02/08/2021 3:46 PM

## 2021-02-09 ENCOUNTER — Encounter (INDEPENDENT_AMBULATORY_CARE_PROVIDER_SITE_OTHER): Payer: Self-pay | Admitting: Vascular Surgery

## 2021-05-06 ENCOUNTER — Other Ambulatory Visit (INDEPENDENT_AMBULATORY_CARE_PROVIDER_SITE_OTHER): Payer: Self-pay | Admitting: Vascular Surgery

## 2021-05-11 ENCOUNTER — Emergency Department
Admission: EM | Admit: 2021-05-11 | Discharge: 2021-05-11 | Disposition: A | Discharge status: Home / Self Care | Payer: PRIVATE HEALTH INSURANCE | Attending: Emergency Medicine | Admitting: Emergency Medicine

## 2021-05-11 ENCOUNTER — Other Ambulatory Visit: Payer: Self-pay

## 2021-05-11 ENCOUNTER — Encounter: Payer: Self-pay | Admitting: Emergency Medicine

## 2021-05-11 DIAGNOSIS — K047 Periapical abscess without sinus: Secondary | ICD-10-CM | POA: Insufficient documentation

## 2021-05-11 DIAGNOSIS — K0889 Other specified disorders of teeth and supporting structures: Secondary | ICD-10-CM | POA: Diagnosis present

## 2021-05-11 MED ORDER — AMOXICILLIN 875 MG PO TABS: 875.0000 mg | ORAL_TABLET | Freq: Two times a day (BID) | ORAL | 0 refills | Status: DC

## 2021-05-11 MED ORDER — AMOXICILLIN 500 MG PO CAPS
1000.0000 mg | ORAL_CAPSULE | Freq: Once | ORAL | Status: AC
  Administered 2021-05-11: 1000 mg via ORAL
  Filled 2021-05-11: qty 2

## 2021-05-11 MED ORDER — OXYCODONE-ACETAMINOPHEN 5-325 MG PO TABS: 1.0000 | ORAL_TABLET | Freq: Four times a day (QID) | ORAL | 0 refills | Status: DC | PRN

## 2021-05-11 NOTE — ED Triage Notes (Signed)
Pt to ED from home c/o left upper dental pain for over a week.  States has appointment with dental surgeon later in March but can't stand pain.  Pt maintaining secretions, no obvious swelling, pt in NAD at this time.

## 2021-05-11 NOTE — ED Provider Notes (Signed)
[facility] Provider Note  Patient Contact: 10:06 PM (approximate)   History   Dental Pain   HPI  Thomas Hopkins is a 36 y.o. male who presents the emergency department complaining of left upper dental pain.  Patient has poor dentition throughout his teeth but states that he has a tooth that is broken they believes is infected.  He had a area on his gum last month that he "popped" to drain.  States that now he is having pain that is more under to the tooth.  He states that he is called an oral surgeon to have his tooth pulled but they have not gotten back to him on his appointment time.  With no fevers or chills.  No other complaints at this time.     Physical Exam   Triage Vital Signs: ED Triage Vitals  Enc Vitals Group     BP 05/11/21 1935 (!) 173/94     Pulse Rate 05/11/21 1935 67     Resp 05/11/21 1935 16     Temp 05/11/21 1935 98.7 F (37.1 C)     Temp Source 05/11/21 1935 Oral     SpO2 05/11/21 1935 97 %     Weight 05/11/21 1936 (!) 310 lb (140.6 kg)     Height 05/11/21 1936 5\' 9"  (1.753 m)     Head Circumference --      Peak Flow --      Pain Score 05/11/21 1935 10     Pain Loc --      Pain Edu? --      Excl. in [place]? --     Most recent vital signs: Vitals:   05/11/21 1935  BP: (!) 173/94  Pulse: 67  Resp: 16  Temp: 98.7 F (37.1 C)  SpO2: 97%     General: Alert and in no acute distress. ENT:      Ears:       Nose: No congestion/rhinnorhea.      Mouth/Throat: Mucous membranes are moist.  Multiple caries, erosions, missing dentition.  Patient has 1 tooth that is obviously carried in the left upper dentition with tenderness to tapping with a tongue depressor.  There is no visible edema or erythema of the gum itself.  No drainable abscess.  There is no edema of the face overlying this tooth. Neck: No stridor. No cervical spine tenderness to palpation Cardiovascular:  Good peripheral perfusion Respiratory: Normal respiratory  effort without tachypnea or retractions. Lungs CTAB. Musculoskeletal: Full range of motion to all extremities.  Neurologic:  No gross focal neurologic deficits are appreciated.  Skin:   No rash noted Other:   ED Results / Procedures / Treatments   Labs (all labs ordered are listed, but only abnormal results are displayed) Labs Reviewed - No data to display   EKG     RADIOLOGY    No results found.  PROCEDURES:  Critical Care performed: No  Procedures   MEDICATIONS ORDERED IN ED: Medications  amoxicillin (AMOXIL) capsule 1,000 mg (has no administration in time range)     IMPRESSION / MDM / ASSESSMENT AND PLAN / ED COURSE  I reviewed the triage vital signs and the nursing notes.                              Differential diagnosis includes, but is not limited to, dental infection, broken dentition   Patient's diagnosis is consistent with dental  infection.  Patient presented to the emergency department after complaining of left upper dental pain.  He has poor dentition throughout.Marland Kitchen  Antibiotics started tonight.  To be discharged with antibiotics and a very limited prescription for Percocet for pain.  Follow-up with oral surgeon to have dentition removed.  Return precautions discussed with the patient.  Patient is given ED precautions to return to the ED for any worsening or new symptoms.       FINAL CLINICAL IMPRESSION(S) / ED DIAGNOSES   Final diagnoses:  Dental infection     Rx / DC Orders   ED Discharge Orders          Ordered    amoxicillin (AMOXIL) 875 MG tablet  2 times daily        05/11/21 2216    oxyCODONE-acetaminophen (PERCOCET/ROXICET) 5-325 MG tablet  Every 6 hours PRN        05/11/21 2216             Note:  This document was prepared using Dragon voice recognition software and may include unintentional dictation errors.   Gorman, Jarmon 05/11/21 2219    Duffy Bruce, MD 05/15/21 1110

## 2021-06-25 ENCOUNTER — Other Ambulatory Visit: Payer: Self-pay

## 2021-06-25 ENCOUNTER — Emergency Department
Admission: EM | Admit: 2021-06-25 | Discharge: 2021-06-25 | Disposition: A | Discharge status: Home / Self Care | Payer: PRIVATE HEALTH INSURANCE | Attending: Emergency Medicine | Admitting: Emergency Medicine

## 2021-06-25 DIAGNOSIS — Z720 Tobacco use: Secondary | ICD-10-CM | POA: Insufficient documentation

## 2021-06-25 DIAGNOSIS — B9789 Other viral agents as the cause of diseases classified elsewhere: Secondary | ICD-10-CM | POA: Insufficient documentation

## 2021-06-25 DIAGNOSIS — J069 Acute upper respiratory infection, unspecified: Secondary | ICD-10-CM | POA: Insufficient documentation

## 2021-06-25 DIAGNOSIS — H1031 Unspecified acute conjunctivitis, right eye: Secondary | ICD-10-CM | POA: Insufficient documentation

## 2021-06-25 MED ORDER — ERYTHROMYCIN 5 MG/GM OP OINT: 1.0000 "application " | TOPICAL_OINTMENT | Freq: Every day | OPHTHALMIC | 0 refills | Status: DC

## 2021-06-25 NOTE — ED Triage Notes (Signed)
Pt to ED by POV from home c/o cough (4 days) and R sided eye irritation (today). Arrives A+O, VSS, NADN. ?

## 2021-06-25 NOTE — ED Provider Notes (Signed)
? ?[facility] ?Provider Note ? ? ? Event Date/Time  ? First MD Initiated Contact with Patient 06/25/21 0604   ?  (approximate) ? ? ?History  ? ?Eye Drainage and Cough ? ? ?HPI ? ?Thomas Hopkins is a 36 y.o. male with past medical history of lower extremity DVT who presents with cough congestion sore throat and right eye redness.  Symptoms have been going on for the last several days.  Has had sore throat frequent sneezing nasal congestion.  Today he woke up and the right eye was red and somewhat painful.  Does not wear glasses or contacts.  No visual change.  Has been taking DayQuil.  Denies shortness of breath chest pain nausea vomiting abdominal pain.  Not having fevers. ?  ? ?Past Medical History:  ?Diagnosis Date  ? DVT of lower extremity, bilateral (HCC)   ? stopped Eliquis in 2017  ? ? ?Patient Active Problem List  ? Diagnosis Date Noted  ? Morbid obesity (HCC) 06/17/2019  ? 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 Hyperlipidemia 03/19/2017  ? Atherosclerosis of aorta (HCC) 01/27/2017  ? Ischemic foot pain at rest 11/18/2016  ? Tobacco use disorder 05/04/2016  ? Acute venous embolism and thrombosis of deep vessels of distal lower extremity (HCC) 10/22/2014  ? Deep venous thrombosis (HCC) 10/22/2014  ? RLQ abdominal pain   ? Abdominal pain, acute, right lower quadrant 10/13/2014  ? ? ? ?Physical Exam  ?Triage Vital Signs: ?ED Triage Vitals  ?Enc Vitals Group  ?   BP 06/25/21 0559 130/78  ?   Pulse Rate 06/25/21 0559 70  ?   Resp 06/25/21 0559 18  ?   Temp 06/25/21 0559 97.8 ?F (36.6 ?C)  ?   Temp Source 06/25/21 0559 Oral  ?   SpO2 06/25/21 0559 98 %  ?   Weight 06/25/21 0600 (!) 310 lb (140.6 kg)  ?   Height 06/25/21 0600 5\' 9"  (1.753 m)  ?   Head Circumference --   ?   Peak Flow --   ?   Pain Score 06/25/21 0559 4  ?   Pain Loc --   ?   Pain Edu? --   ?   Excl. in [place]? --   ? ? ?Most recent vital signs: ?Vitals:  ? 06/25/21 0559  ?BP: 130/78  ?Pulse: 70   ?Resp: 18  ?Temp: 97.8 ?F (36.6 ?C)  ?SpO2: 98%  ? ? ? ?General: Awake, no distress.  ?CV:  Good peripheral perfusion.  ?Resp:  Normal effort.  Lungs are clear ?Abd:  No distention.  ?Neuro:             Awake, Alert, Oriented x 3  ?Other:  Normal posterior oropharynx, no exudate or significant erythema ?Right eye is injected, spares the limbus, green drainage at the medial border ?Extraocular movements are intact ?Pupils equal round reactive to light ? ? ?ED Results / Procedures / Treatments  ?Labs ?(all labs ordered are listed, but only abnormal results are displayed) ?Labs Reviewed - No data to display ? ? ?EKG ? ? ? ?RADIOLOGY ? ? ? ?PROCEDURES: ? ?Critical Care performed: No ? ?Procedures ? ? ?MEDICATIONS ORDERED IN ED: ?Medications - No data to display ? ? ?IMPRESSION / MDM / ASSESSMENT AND PLAN / ED COURSE  ?I reviewed the triage vital signs and the nursing notes. ?             ?               ? ?  Differential diagnosis includes, but is not limited to, viral illness, allergic rhinitis, viral conjunctivitis, bacterial conjunctivitis, allergic conjunctivitis ? ?Patient is a 36 year old male who presents with cough sore throat sneezing nasal discharge and today with right eye redness and drainage.  Right eye is mildly injected sparing the limbus with some scant purulent drainage.  In the setting of his other symptoms likely viral conjunctivitis will cover with erythromycin ointment.  Does not wear glasses or contacts.  Otherwise has no signs of bacterial pharyngitis and lungs are clear without shortness of breath or fevers.  No indication for further work-up at this time.  We will treat with erythromycin ointment.  Work note provided. ? ?  ? ? ?FINAL CLINICAL IMPRESSION(S) / ED DIAGNOSES  ? ?Final diagnoses:  ?Viral URI with cough  ?Acute conjunctivitis of right eye, unspecified acute conjunctivitis type  ? ? ? ?Rx / DC Orders  ? ?ED Discharge Orders   ? ?      Ordered  ?  erythromycin ophthalmic ointment  Daily  at bedtime       ? 06/25/21 0617  ? ?  ?  ? ?  ? ? ? ?Note:  This document was prepared using Dragon voice recognition software and may include unintentional dictation errors. ?  ?Georga Hacking, MD ?06/25/21 [phone] ? ?

## 2021-10-14 ENCOUNTER — Other Ambulatory Visit: Payer: Self-pay

## 2021-10-14 ENCOUNTER — Emergency Department
Admission: EM | Admit: 2021-10-14 | Discharge: 2021-10-14 | Disposition: A | Discharge status: Home / Self Care | Payer: 59 | Attending: Emergency Medicine | Admitting: Emergency Medicine

## 2021-10-14 ENCOUNTER — Emergency Department: Payer: 59

## 2021-10-14 DIAGNOSIS — K2901 Acute gastritis with bleeding: Secondary | ICD-10-CM | POA: Diagnosis not present

## 2021-10-14 DIAGNOSIS — K2900 Acute gastritis without bleeding: Secondary | ICD-10-CM

## 2021-10-14 DIAGNOSIS — K0889 Other specified disorders of teeth and supporting structures: Secondary | ICD-10-CM | POA: Insufficient documentation

## 2021-10-14 DIAGNOSIS — R1011 Right upper quadrant pain: Secondary | ICD-10-CM | POA: Diagnosis present

## 2021-10-14 DIAGNOSIS — R1013 Epigastric pain: Secondary | ICD-10-CM

## 2021-10-14 HISTORY — DX: Gastro-esophageal reflux disease without esophagitis: K21.9

## 2021-10-14 LAB — CBC
HCT: 46.8 % (ref 39.0–52.0)
Hemoglobin: 15.4 g/dL (ref 13.0–17.0)
MCH: 29.3 pg (ref 26.0–34.0)
MCHC: 32.9 g/dL (ref 30.0–36.0)
MCV: 89.1 fL (ref 80.0–100.0)
Platelets: 301 10*3/uL (ref 150–400)
RBC: 5.25 MIL/uL (ref 4.22–5.81)
RDW: 13.2 % (ref 11.5–15.5)
WBC: 8.4 10*3/uL (ref 4.0–10.5)
nRBC: 0 % (ref 0.0–0.2)

## 2021-10-14 LAB — URINALYSIS, ROUTINE W REFLEX MICROSCOPIC
Bacteria, UA: NONE SEEN
Bilirubin Urine: NEGATIVE
Glucose, UA: NEGATIVE mg/dL
Ketones, ur: NEGATIVE mg/dL
Leukocytes,Ua: NEGATIVE
Nitrite: NEGATIVE
Protein, ur: NEGATIVE mg/dL
Specific Gravity, Urine: 1.021 (ref 1.005–1.030)
Squamous Epithelial / HPF: NONE SEEN (ref 0–5)
pH: 5 (ref 5.0–8.0)

## 2021-10-14 LAB — COMPREHENSIVE METABOLIC PANEL
ALT: 34 U/L (ref 0–44)
AST: 29 U/L (ref 15–41)
Albumin: 4 g/dL (ref 3.5–5.0)
Alkaline Phosphatase: 47 U/L (ref 38–126)
Anion gap: 8 (ref 5–15)
BUN: 7 mg/dL (ref 6–20)
CO2: 22 mmol/L (ref 22–32)
Calcium: 9.1 mg/dL (ref 8.9–10.3)
Chloride: 106 mmol/L (ref 98–111)
Creatinine, Ser: 0.77 mg/dL (ref 0.61–1.24)
GFR, Estimated: >60 mL/min (ref >60)
Glucose, Bld: 119 mg/dL — ABNORMAL HIGH (ref 70–99)
Potassium: 3.8 mmol/L (ref 3.5–5.1)
Sodium: 136 mmol/L (ref 135–145)
Total Bilirubin: 0.6 mg/dL (ref 0.3–1.2)
Total Protein: 7.2 g/dL (ref 6.5–8.1)

## 2021-10-14 LAB — LIPASE, BLOOD: Lipase: 26 U/L (ref 11–51)

## 2021-10-14 MED ORDER — FAMOTIDINE 20 MG PO TABS: 20.0000 mg | ORAL_TABLET | Freq: Two times a day (BID) | ORAL | 0 refills | Status: DC

## 2021-10-14 MED ORDER — FAMOTIDINE 20 MG PO TABS
40.0000 mg | ORAL_TABLET | Freq: Once | ORAL | Status: AC
  Administered 2021-10-14: 40 mg via ORAL
  Filled 2021-10-14: qty 2

## 2021-10-14 MED ORDER — SUCRALFATE 1 G PO TABS: 1.0000 g | ORAL_TABLET | Freq: Four times a day (QID) | ORAL | 1 refills | Status: DC

## 2021-10-14 MED ORDER — SUCRALFATE 1 G PO TABS
1.0000 g | ORAL_TABLET | Freq: Once | ORAL | Status: AC
  Administered 2021-10-14: 1 g via ORAL
  Filled 2021-10-14: qty 1

## 2021-10-14 MED ORDER — METOCLOPRAMIDE HCL 10 MG PO TABS
10.0000 mg | ORAL_TABLET | Freq: Once | ORAL | Status: AC
  Administered 2021-10-14: 10 mg via ORAL
  Filled 2021-10-14: qty 1

## 2021-10-14 MED ORDER — METOCLOPRAMIDE HCL 10 MG PO TABS: 10.0000 mg | ORAL_TABLET | Freq: Four times a day (QID) | ORAL | 0 refills | Status: DC | PRN

## 2021-10-14 NOTE — ED Notes (Signed)
See triage note  Presents with upper abd pain for the past couple of days  States pain is worse after eating   States he has tried OTC med w/o relief

## 2021-10-14 NOTE — ED Provider Notes (Signed)
[facility] Provider Note    Event Date/Time   First MD Initiated Contact with Patient 10/14/21 1601     (approximate)   History   Chief Complaint: Abdominal Pain and Dental Pain   HPI  Thomas Hopkins is a 36 y.o. male with a history of morbid obesity, appendectomy, lower extremity DVT and arterial occlusion who comes ED complaining of upper abdominal pain, worse with eating its been going on for the past 3 days.  No chest pain or shortness of breath, nonradiating.  No vomiting or diarrhea.  He does report that his bowel movements look darker than usual.  He has been taking Pepto-Bismol for the last few days as well and did attempt to alleviate his pain.  He used to be on antacids for GERD, but has been off of them for quite a while due to lack of follow-up with primary care.  No bloody stool.  No dizziness.  No fever.     Physical Exam   Triage Vital Signs: ED Triage Vitals  Enc Vitals Group     BP 10/14/21 1432 112/79     Pulse Rate 10/14/21 1432 64     Resp 10/14/21 1432 18     Temp 10/14/21 1432 99.2 F (37.3 C)     Temp Source 10/14/21 1432 Oral     SpO2 10/14/21 1432 96 %     Weight 10/14/21 1435 (!) 310 lb (140.6 kg)     Height 10/14/21 1435 5\' 9"  (1.753 m)     Head Circumference --      Peak Flow --      Pain Score 10/14/21 1435 2     Pain Loc --      Pain Edu? --      Excl. in [place]? --     Most recent vital signs: Vitals:   10/14/21 1432 10/14/21 1558  BP: 112/79 118/80  Pulse: 64 68  Resp: 18 18  Temp: 99.2 F (37.3 C)   SpO2: 96% 97%    General: Awake, no distress.  CV:  Good peripheral perfusion.  Regular rate and rhythm Resp:  Normal effort.  Clear to auscultation bilaterally Abd:  No distention.  Soft with epigastric tenderness Other:  No lower extremity edema.  Symmetric calf circumference.  Negative Homans' sign.  Moist oral mucosa   ED Results / Procedures / Treatments   Labs (all labs ordered are listed, but  only abnormal results are displayed) Labs Reviewed  COMPREHENSIVE METABOLIC PANEL - Abnormal; Notable for the following components:      Result Value   Glucose, Bld 119 (*)    All other components within normal limits  URINALYSIS, ROUTINE W REFLEX MICROSCOPIC - Abnormal; Notable for the following components:   Color, Urine YELLOW (*)    APPearance HAZY (*)    Hgb urine dipstick SMALL (*)    All other components within normal limits  LIPASE, BLOOD  CBC     EKG    RADIOLOGY Altered sound right upper quadrant interpreted by me, normal.  Radiology report reviewed, notes hepatic steatosis but otherwise unremarkable   PROCEDURES:  Procedures   MEDICATIONS ORDERED IN ED: Medications  sucralfate (CARAFATE) tablet 1 g (1 g Oral Given 10/14/21 1612)  famotidine (PEPCID) tablet 40 mg (40 mg Oral Given 10/14/21 1612)  metoCLOPramide (REGLAN) tablet 10 mg (10 mg Oral Given 10/14/21 1612)     IMPRESSION / MDM / ASSESSMENT AND PLAN / ED COURSE  I  reviewed the triage vital signs and the nursing notes.                              Differential diagnosis includes, but is not limited to, cholelithiasis, cholecystitis, choledocholithiasis, pancreatitis, GERD/gastritis, peptic ulcer disease  Patient's presentation is most consistent with acute presentation with potential threat to life or bodily function.  Patient presents with epigastric pain.  Low suspicion for ACS PE dissection, AAA, mesenteric ischemia.  He has tenderness in the area, most likely this is GERD/gastritis.  Labs and ultrasound of the gallbladder are unremarkable today.  We will have him continue Carafate Reglan Pepcid and follow-up with gastroenterology for further evaluation.       FINAL CLINICAL IMPRESSION(S) / ED DIAGNOSES   Final diagnoses:  Epigastric pain  Acute gastritis, presence of bleeding unspecified, unspecified gastritis type     Rx / DC Orders   ED Discharge Orders          Ordered    sucralfate  (CARAFATE) 1 g tablet  4 times daily        10/14/21 1712    famotidine (PEPCID) 20 MG tablet  2 times daily        10/14/21 1712    metoCLOPramide (REGLAN) 10 MG tablet  Every 6 hours PRN        10/14/21 1712             Note:  This document was prepared using Dragon voice recognition software and may include unintentional dictation errors.   Sharman Cheek, MD 10/14/21 [phone]

## 2021-10-14 NOTE — ED Triage Notes (Signed)
Pt c/o upper abdominal pain after eating and "dark" stools x3 days and possible infection in L upper tooth x2-3 days.  Pain score 2/10.  Hx of GERD.  Sts he has taken OTC medications w/o relief.

## 2021-10-14 NOTE — Discharge Instructions (Addendum)
Your lab tests and ultrasound of the gallbladder were all okay today.  Please follow-up with gastroenterology in 1 week to reassess your symptoms and determine the need for additional testing.

## 2021-12-17 ENCOUNTER — Other Ambulatory Visit: Payer: Self-pay

## 2021-12-17 ENCOUNTER — Emergency Department: Payer: 59

## 2021-12-17 DIAGNOSIS — D72829 Elevated white blood cell count, unspecified: Secondary | ICD-10-CM | POA: Insufficient documentation

## 2021-12-17 DIAGNOSIS — X58XXXA Exposure to other specified factors, initial encounter: Secondary | ICD-10-CM | POA: Insufficient documentation

## 2021-12-17 DIAGNOSIS — M79605 Pain in left leg: Secondary | ICD-10-CM | POA: Insufficient documentation

## 2021-12-17 DIAGNOSIS — S29012A Strain of muscle and tendon of back wall of thorax, initial encounter: Secondary | ICD-10-CM | POA: Diagnosis not present

## 2021-12-17 DIAGNOSIS — Z7902 Long term (current) use of antithrombotics/antiplatelets: Secondary | ICD-10-CM | POA: Insufficient documentation

## 2021-12-17 DIAGNOSIS — Z720 Tobacco use: Secondary | ICD-10-CM | POA: Insufficient documentation

## 2021-12-17 DIAGNOSIS — M79604 Pain in right leg: Secondary | ICD-10-CM | POA: Diagnosis not present

## 2021-12-17 DIAGNOSIS — Z7982 Long term (current) use of aspirin: Secondary | ICD-10-CM | POA: Diagnosis not present

## 2021-12-17 DIAGNOSIS — S299XXA Unspecified injury of thorax, initial encounter: Secondary | ICD-10-CM | POA: Diagnosis present

## 2021-12-17 LAB — CBC
HCT: 45.5 % (ref 39.0–52.0)
Hemoglobin: 15 g/dL (ref 13.0–17.0)
MCH: 29.2 pg (ref 26.0–34.0)
MCHC: 33 g/dL (ref 30.0–36.0)
MCV: 88.5 fL (ref 80.0–100.0)
Platelets: 292 10*3/uL (ref 150–400)
RBC: 5.14 MIL/uL (ref 4.22–5.81)
RDW: 13.2 % (ref 11.5–15.5)
WBC: 11 10*3/uL — ABNORMAL HIGH (ref 4.0–10.5)
nRBC: 0 % (ref 0.0–0.2)

## 2021-12-17 LAB — TROPONIN I (HIGH SENSITIVITY): Troponin I (High Sensitivity): 5 ng/L (ref <18)

## 2021-12-17 LAB — BASIC METABOLIC PANEL
Anion gap: 9 (ref 5–15)
BUN: 11 mg/dL (ref 6–20)
CO2: 25 mmol/L (ref 22–32)
Calcium: 9.1 mg/dL (ref 8.9–10.3)
Chloride: 104 mmol/L (ref 98–111)
Creatinine, Ser: 0.83 mg/dL (ref 0.61–1.24)
GFR, Estimated: >60 mL/min (ref >60)
Glucose, Bld: 100 mg/dL — ABNORMAL HIGH (ref 70–99)
Potassium: 3.7 mmol/L (ref 3.5–5.1)
Sodium: 138 mmol/L (ref 135–145)

## 2021-12-17 NOTE — ED Triage Notes (Signed)
Pt arrives with c/o back pain that started a few days ago. Pt denies SOB. Per pt, the pain is in between his shoulder blades.

## 2021-12-18 ENCOUNTER — Emergency Department: Payer: 59

## 2021-12-18 ENCOUNTER — Emergency Department
Admission: EM | Admit: 2021-12-18 | Discharge: 2021-12-18 | Disposition: A | Discharge status: Home / Self Care | Payer: 59 | Attending: Emergency Medicine | Admitting: Emergency Medicine

## 2021-12-18 DIAGNOSIS — M546 Pain in thoracic spine: Secondary | ICD-10-CM

## 2021-12-18 DIAGNOSIS — T148XXA Other injury of unspecified body region, initial encounter: Secondary | ICD-10-CM

## 2021-12-18 LAB — CK: Total CK: 251 U/L (ref 49–397)

## 2021-12-18 LAB — TROPONIN I (HIGH SENSITIVITY): Troponin I (High Sensitivity): 4 ng/L (ref <18)

## 2021-12-18 MED ORDER — SODIUM CHLORIDE 0.9 % IV BOLUS
1000.0000 mL | Freq: Once | INTRAVENOUS | Status: AC
  Administered 2021-12-18: 1000 mL via INTRAVENOUS

## 2021-12-18 MED ORDER — CYCLOBENZAPRINE HCL 5 MG PO TABS: ORAL_TABLET | ORAL | 0 refills | Status: DC

## 2021-12-18 MED ORDER — IOHEXOL 350 MG/ML SOLN
100.0000 mL | Freq: Once | INTRAVENOUS | Status: AC | PRN
Start: 2021-12-18 — End: 2021-12-18
  Administered 2021-12-18: 100 mL via INTRAVENOUS

## 2021-12-18 MED ORDER — KETOROLAC TROMETHAMINE 30 MG/ML IJ SOLN
15.0000 mg | Freq: Once | INTRAMUSCULAR | Status: AC
  Administered 2021-12-18: 15 mg via INTRAVENOUS
  Filled 2021-12-18: qty 1

## 2021-12-18 MED ORDER — NAPROXEN 500 MG PO TABS: 500.0000 mg | ORAL_TABLET | Freq: Two times a day (BID) | ORAL | 0 refills | Status: DC

## 2021-12-18 NOTE — Discharge Instructions (Signed)
You may take medicines as needed for pain and muscle spasms (Naprosyn/Flexeril).  Apply moist heat to affected area several times daily.  Return to the ER for worsening symptoms, persistent vomiting, difficulty breathing or other concerns. 

## 2021-12-18 NOTE — ED Notes (Signed)
ED Provider at bedside. 

## 2021-12-18 NOTE — ED Notes (Signed)
US at bedside

## 2021-12-18 NOTE — ED Notes (Signed)
Patient transported to CT 

## 2021-12-18 NOTE — ED Notes (Signed)
Pt brought to ED rm 9 at this time, this RN now assuming care. 

## 2021-12-18 NOTE — ED Notes (Signed)
Patient transported from CT 

## 2021-12-18 NOTE — ED Provider Notes (Signed)
[facility] Provider Note    Event Date/Time   First MD Initiated Contact with Patient 12/18/21 0038     (approximate)   History   Back Pain   HPI  Thomas Hopkins is a 36 y.o. male chief complaint of nontraumatic thoracic back pain which began several days ago.  Patient describes sharp pain in between his shoulder blades.  Denies trauma/fall/injury.  Denies chest pain or shortness of breath.  Denies fever, cough, abdominal pain, nausea, vomiting or dizziness.  Patient takes Plavix and aspirin for history of DVTs because he could not afford Eliquis.  Denies recent long travel.  Does note the occasional aches and pains in both legs.     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) 03/19/2017   Hyperlipidemia 03/19/2017   Atherosclerosis of aorta (HCC) 01/27/2017   Ischemic foot pain at rest 11/18/2016   Tobacco use disorder 05/04/2016   Acute venous embolism and thrombosis of deep vessels of distal lower extremity (HCC) 10/22/2014   Deep venous thrombosis (HCC) 10/22/2014   RLQ abdominal pain    Abdominal pain, acute, right lower quadrant 10/13/2014     Past Surgical History   Past Surgical History:  Procedure Laterality Date   ABDOMINAL AORTOGRAM W/LOWER EXTREMITY Right 11/18/2016   Procedure: ABDOMINAL AORTOGRAM W/LOWER EXTREMITY;  Surgeon: Renford Dills, MD;  Location: [facility] INVASIVE CV LAB;  Service: Cardiovascular;  Laterality: Right;   APPENDECTOMY     LAPAROSCOPIC APPENDECTOMY N/A 10/14/2014   Procedure: APPENDECTOMY LAPAROSCOPIC;  Surgeon: Lattie Haw, MD;  Location: [facility] ORS;  Service: General;  Laterality: N/A;    TONSILLECTOMY     WISDOM TOOTH EXTRACTION       Home Medications   Prior to Admission medications   Medication Sig Start Date End Date Taking? Authorizing Provider  cyclobenzaprine (FLEXERIL) 5 MG tablet 1 tablet every 8 hours as he did for muscle spasms 12/18/21  Yes Irean Hong, MD  naproxen (NAPROSYN) 500 MG tablet Take 1 tablet (500 mg total) by mouth 2 (two) times daily with a meal. 12/18/21  Yes Irean Hong, MD  acetaminophen (TYLENOL) 500 MG tablet Take 500 mg by mouth every 6 (six) hours as needed.    [provider]  aspirin 325 MG tablet Take 325 mg by mouth daily.    [provider]  Aspirin-Acetaminophen-Caffeine [phone] MG PACK Take by mouth.    [provider]  atorvastatin (LIPITOR) 20 MG tablet TAKE 1 TABLET BY MOUTH EVERY DAY AFTER 6PM 07/01/19   Georgiana Spinner, NP  clopidogrel (PLAVIX) 75 MG tablet TAKE 1 TABLET BY MOUTH EVERY DAY 05/07/21   Schnier, Latina Craver, MD  erythromycin ophthalmic ointment Place 1 application. into the right eye at bedtime. 06/25/21   Georga Hacking, MD  famotidine (PEPCID) 20 MG tablet Take 1 tablet (20 mg total) by mouth 2 (two) times daily. 10/14/21   Sharman Cheek, MD  ibuprofen (ADVIL,MOTRIN) 600 MG tablet Take 1 tablet (600 mg total) by mouth every 6 (six) hours as needed. 03/23/17   Joni Reining, PA-C  metoCLOPramide (REGLAN) 10 MG tablet Take 1  tablet (10 mg total) by mouth every 6 (six) hours as needed. 10/14/21   Sharman Cheek, MD  ondansetron (ZOFRAN ODT) 4 MG disintegrating tablet Take 1 tablet (4 mg total) by mouth every 8 (eight) hours as needed. 01/26/21   Nita Sickle, MD  sucralfate (CARAFATE) 1 g tablet Take 1 tablet (1 g total) by mouth 4 (four) times daily. 10/14/21   Sharman Cheek, MD     Allergies  Bee venom, Vicodin [hydrocodone-acetaminophen], Hydrocodone-acetaminophen, Mushroom extract complex, and Olive oil   Family History   Family History  Problem Relation Age of Onset    Diabetes Mother    Diabetes Mellitus II Maternal Grandmother    Rectal cancer Father      Physical Exam  Triage Vital Signs: ED Triage Vitals [12/17/21 2033]  Enc Vitals Group     BP (!) 117/93     Pulse Rate 72     Resp 20     Temp 98.3 F (36.8 C)     Temp Source Oral     SpO2 93 %     Weight (!) 310 lb (140.6 kg)     Height 5\' 9"  (1.753 m)     Head Circumference      Peak Flow      Pain Score 6     Pain Loc      Pain Edu?      Excl. in [place]?     Updated Vital Signs: BP 105/68   Pulse 64   Temp 98.3 F (36.8 C) (Oral)   Resp 20   Ht 5\' 9"  (1.753 m)   Wt (!) 140.6 kg   SpO2 96%   BMI 45.78 kg/m    General: Awake, no distress.  CV:  RRR.  Good peripheral perfusion.  Resp:  Normal effort.  CTA B. Abd:  Nontender.  No distention.  Other:  No spinal tenderness to palpation.  Alert and oriented x3.  CN II-XII grossly intact.  5/5 motor strength and sensation all extremities. MAEx4.   ED Results / Procedures / Treatments  Labs (all labs ordered are listed, but only abnormal results are displayed) Labs Reviewed  BASIC METABOLIC PANEL - Abnormal; Notable for the following components:      Result Value   Glucose, Bld 100 (*)    All other components within normal limits  CBC - Abnormal; Notable for the following components:   WBC 11.0 (*)    All other components within normal limits  CK  TROPONIN I (HIGH SENSITIVITY)  TROPONIN I (HIGH SENSITIVITY)     EKG  ED ECG REPORT I, Geralene Afshar J, the attending physician, personally viewed and interpreted this ECG.   Date: 12/18/2021  EKG Time: 2034  Rate: 77  Rhythm: normal sinus rhythm  Axis: Normal   Intervals:none  ST&T Change: Nonspecific    RADIOLOGY I have independently visualized and interpreted patient's x-ray, CT scan and ultrasound as well as noted the radiology interpretation:  X-ray: No acute cardiopulmonary process  CTA chest: No PE  Ultrasound: No DVTs  Official radiology  report(s): 02/17/2022 Venous Img Lower Bilateral (DVT)  Result Date: 12/18/2021 CLINICAL DATA:  Back pain.  History of tobacco use. EXAM: Bilateral LOWER EXTREMITY VENOUS DOPPLER ULTRASOUND TECHNIQUE: Gray-scale sonography with compression, as well as color and duplex ultrasound, were performed to evaluate the deep venous system(s) from the level of the common femoral vein through the popliteal and proximal calf veins. COMPARISON:  None Available. FINDINGS: VENOUS Normal compressibility of the  bilateral common femoral, superficial femoral, and popliteal veins, as well as the visualized calf veins. Visualized portions of profunda femoral vein and great saphenous vein unremarkable. No filling defects to suggest DVT on grayscale or color Doppler imaging. Doppler waveforms show normal direction of venous flow, normal respiratory plasticity and response to augmentation. OTHER None. Limitations: none IMPRESSION: No evidence of acute deep venous thrombosis in the visualized lower extremity veins. Electronically Signed   By: Burman Nieves M.D.   On: 12/18/2021 02:58   CT Angio Chest PE W/Cm &/Or Wo Cm  Result Date: 12/18/2021 CLINICAL DATA:  Suspected pulmonary embolism. EXAM: CT ANGIOGRAPHY CHEST WITH CONTRAST TECHNIQUE: Multidetector CT imaging of the chest was performed using the standard protocol during bolus administration of intravenous contrast. Multiplanar CT image reconstructions and MIPs were obtained to evaluate the vascular anatomy. RADIATION DOSE REDUCTION: This exam was performed according to the departmental dose-optimization program which includes automated exposure control, adjustment of the mA and/or kV according to patient size and/or use of iterative reconstruction technique. CONTRAST:  OMNIPAQUE IOHEXOL 350 MG/ML SOLN COMPARISON:  None Available. FINDINGS: Cardiovascular: Satisfactory opacification of the pulmonary arteries to the segmental level. No evidence of pulmonary embolism. Normal heart  size. No pericardial effusion. Mediastinum/Nodes: No enlarged mediastinal, hilar, or axillary lymph nodes. Mild right infrahilar, perivascular lymphadenopathy is noted. Thyroid gland, trachea, and esophagus demonstrate no significant findings. Lungs/Pleura: Lungs are clear. No pleural effusion or pneumothorax. Upper Abdomen: No acute abnormality. Musculoskeletal: No chest wall abnormality. No acute or significant osseous findings. Review of the MIP images confirms the above findings. IMPRESSION: 1. No evidence of pulmonary embolism or other acute intrathoracic process. Electronically Signed   By: Aram Candela M.D.   On: 12/18/2021 02:00   DG Chest 2 View  Result Date: 12/17/2021 CLINICAL DATA:  Back pain for several days. EXAM: CHEST - 2 VIEW COMPARISON:  August 20, 2017 FINDINGS: The heart size and mediastinal contours are within normal limits. Low lung volumes are noted. Both lungs are clear. The visualized skeletal structures are unremarkable. IMPRESSION: No active cardiopulmonary disease. Electronically Signed   By: Aram Candela M.D.   On: 12/17/2021 20:51     PROCEDURES:  Critical Care performed: No  .1-3 Lead EKG Interpretation  Performed by: Irean Hong, MD Authorized by: Irean Hong, MD     Interpretation: normal     ECG rate:  65   ECG rate assessment: normal     Rhythm: sinus rhythm     Ectopy: none     Conduction: normal   Comments:     Patient placed on cardiac monitor to evaluate for arrhythmias    MEDICATIONS ORDERED IN ED: Medications  sodium chloride 0.9 % bolus 1,000 mL (0 mLs Intravenous Stopped 12/18/21 0137)  ketorolac (TORADOL) 30 MG/ML injection 15 mg (15 mg Intravenous Given 12/18/21 0058)  iohexol (OMNIPAQUE) 350 MG/ML injection 100 mL (100 mLs Intravenous Contrast Given 12/18/21 0141)     IMPRESSION / MDM / ASSESSMENT AND PLAN / ED COURSE  I reviewed the triage vital signs and the nursing notes.                             36 year old male  presenting with nontraumatic thoracic back pain, history of DVTs currently on Plavix and aspirin. Differential diagnosis includes, but is not limited to, ACS, aortic dissection, pulmonary embolism, cardiac tamponade, pneumothorax, pneumonia, pericarditis, myocarditis, GI-related causes including esophagitis/gastritis, and musculoskeletal  chest wall pain.   I have personally reviewed patient's records and note a recent ER visit in August for epigastric pain.  Patient's presentation is most consistent with acute presentation with potential threat to life or bodily function.  The patient is on the cardiac monitor to evaluate for evidence of arrhythmia and/or significant heart rate changes.  Laboratory results demonstrate normal WBC 11, normal electrolytes and troponin x2, normal EKG.  We will add CK, CTA chest to evaluate for PE, BLE Doppler ultrasounds to evaluate for DVT.  Administer IV fluids and ketorolac for pain.  Will reassess.  Clinical Course as of 12/18/21 0326  Sat Dec 18, 2021  0323 Patient resting in no acute distress. Updated him on negative CT and [place]. Will discharge home with as needed Naprosyn (limited quantity given patient on Plavix & Aspirin) and Flexeril. Strict return precautions given. Patient verbalizes understanding and agrees with plan of care. [JS]    Clinical Course User Index [JS] Paulette Blanch, MD     FINAL CLINICAL IMPRESSION(S) / ED DIAGNOSES   Final diagnoses:  Acute bilateral thoracic back pain  Muscle strain     Rx / DC Orders   ED Discharge Orders          Ordered    naproxen (NAPROSYN) 500 MG tablet  2 times daily with meals        12/18/21 0321    cyclobenzaprine (FLEXERIL) 5 MG tablet        12/18/21 0321             Note:  This document was prepared using Dragon voice recognition software and may include unintentional dictation errors.   Paulette Blanch, MD 12/18/21 [phone]

## 2022-02-11 ENCOUNTER — Other Ambulatory Visit (INDEPENDENT_AMBULATORY_CARE_PROVIDER_SITE_OTHER): Payer: Self-pay | Admitting: Vascular Surgery

## 2022-02-11 DIAGNOSIS — I743 Embolism and thrombosis of arteries of the lower extremities: Secondary | ICD-10-CM

## 2022-02-12 NOTE — Progress Notes (Unsigned)
MRN : [id number]  Thomas Hopkins is a 36 y.o. (August 18, 1985) male who presents with chief complaint of check circulation.  History of Present Illness:   The patient returns to the office for follow up.  There has been no further episodes of embolization.  There have been no interval changes in lower extremity symptoms. No interval shortening of the patient's claudication distance or development of rest pain symptoms. No new ulcers or wounds have occurred since the last visit.  No new symptoms to suggest he has had another embolism.   Interim he has had problems with GERD as well as carpal tunnel of the left wrist   Historically:  He is s/p right leg thromboembolectomy of the tibial vessels and angioplaty of the PT on 11/18/2016.  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   There have been no significant changes to the patient's overall health care.     The patient denies amaurosis fugax or recent TIA symptoms. There are no recent neurological changes noted. The patient has history of DVT but no PE or superficial thrombophlebitis.   Previous CT angiogram dated 06/13/2018 shows resolution of the distal aortic thrombus with preservation of the iliofemoral and popliteal system.  CT angiogram of the chest dated 12/18/2021 is reviewed by me the aorta is imaged from the aortic valve to the level of the mesenteric takeoffs.  Aorta remains completely normal no evidence of thrombus formation ulceration or other abnormalities.  Origins of the great vessels of the arch are all widely patent as well.  No evidence for pulmonary embolism.  ABI's Rt=1.12 (triphasic) and Lt=1.16 (triphasic)  (previous ABI's Rt=1.21 (triphasic) and Lt=1.05 (triphasic))  No outpatient medications have been marked as taking for the 02/14/22 encounter (Appointment) with Delana Meyer, Dolores Lory, MD.    Past Medical History:  Diagnosis Date   DVT of lower extremity, bilateral  ([place])    stopped Eliquis in 2017   GERD (gastroesophageal reflux disease)     Past Surgical History:  Procedure Laterality Date   ABDOMINAL AORTOGRAM W/LOWER EXTREMITY Right 11/18/2016   Procedure: ABDOMINAL AORTOGRAM W/LOWER EXTREMITY;  Surgeon: Katha Cabal, MD;  Location: [facility];  Service: Cardiovascular;  Laterality: Right;   APPENDECTOMY     LAPAROSCOPIC APPENDECTOMY N/A 10/14/2014   Procedure: APPENDECTOMY LAPAROSCOPIC;  Surgeon: Florene Glen, MD;  Location: [facility] ORS;  Service: General;  Laterality: N/A;   TONSILLECTOMY     WISDOM TOOTH EXTRACTION      Social History Social History   Tobacco Use   Smoking status: Some Days    Packs/day: 1.00    Years: 15.00    Total pack years: 15.00    Types: Cigarettes   Smokeless tobacco: Never  Substance Use Topics   Alcohol use: Yes    Comment: occ   Drug use: No    Family History Family History  Problem Relation Age of Onset   Diabetes Mother    Diabetes Mellitus II Maternal Grandmother    Rectal cancer Father     Allergies  Allergen Reactions   Bee Venom Hives   Vicodin [Hydrocodone-Acetaminophen] Nausea And Vomiting    Headache   Hydrocodone-Acetaminophen Nausea And Vomiting    Headache   Mushroom Extract Complex Nausea And Vomiting   Olive Oil Nausea And Vomiting     REVIEW OF SYSTEMS (Negative unless checked)  Constitutional: [] Weight loss  [] Fever  [] Chills Cardiac: [] Chest pain   [] Chest pressure   [] Palpitations   [] Shortness of breath when laying flat   [] Shortness of breath with exertion. Vascular:  [x] Pain in legs with walking   [] Pain in legs at rest  [] History of DVT   [] Phlebitis   [] Swelling in legs   [] Varicose veins   [] Non-healing ulcers Pulmonary:   [] Uses home oxygen   [] Productive cough   [] Hemoptysis   [] Wheeze  [] COPD   [] Asthma Neurologic:  [] Dizziness   [] Seizures   [] History of stroke   [] History of TIA  [] Aphasia   [] Vissual changes   [] Weakness or numbness in arm    [] Weakness or numbness in leg Musculoskeletal:   [] Joint swelling   [] Joint pain   [] Low back pain Hematologic:  [] Easy bruising  [] Easy bleeding   [] Hypercoagulable state   [] Anemic Gastrointestinal:  [] Diarrhea   [] Vomiting  [] Gastroesophageal reflux/heartburn   [] Difficulty swallowing. Genitourinary:  [] Chronic kidney disease   [] Difficult urination  [] Frequent urination   [] Blood in urine Skin:  [] Rashes   [] Ulcers  Psychological:  [] History of anxiety   []  History of major depression.  Physical Examination  There were no vitals filed for this visit. There is no height or weight on file to calculate BMI. Gen: WD/WN, NAD Head: [place]/AT, No temporalis wasting.  Ear/Nose/Throat: Hearing grossly intact, nares w/o erythema or drainage Eyes: PER, EOMI, sclera nonicteric.  Neck: Supple, no masses.  No bruit or JVD.  Pulmonary:  Good air movement, no audible wheezing, no use of accessory muscles.  Cardiac: RRR, normal S1, S2, no Murmurs. Vascular:   Vessel Right Left  Radial Palpable Palpable  PT Not Palpable Not Palpable  DP Not Palpable Not Palpable  Gastrointestinal: soft, non-distended. No guarding/no peritoneal signs.  Musculoskeletal: M/S 5/5 throughout.  No visible deformity.  Neurologic: CN 2-12 intact. Pain and light touch intact in extremities.  Symmetrical.  Speech is fluent. Motor exam as listed above. Psychiatric: Judgment intact, Mood & affect appropriate for pt's clinical situation. Dermatologic: No rashes or ulcers noted.  No changes consistent with cellulitis.   CBC Lab Results  Component Value Date   WBC 11.0 (H) 12/17/2021   HGB 15.0 12/17/2021   HCT 45.5 12/17/2021   MCV 88.5 12/17/2021   PLT 292 12/17/2021    BMET    Component Value Date/Time   NA 138 12/17/2021 2035   K 3.7 12/17/2021 2035   CL 104 12/17/2021 2035   CO2 25 12/17/2021 2035   GLUCOSE 100 (H) 12/17/2021 2035   BUN 11 12/17/2021 2035   CREATININE 0.83 12/17/2021 2035   CALCIUM 9.1  12/17/2021 2035   GFRNONAA >60 12/17/2021 2035   GFRAA >60 11/28/2017 0834   CrCl cannot be calculated (Patient's most recent lab result is older than the maximum 21 days allowed.).  COAG Lab Results  Component Value Date   INR 1.05 11/18/2016   INR 1.23 10/23/2014    Radiology No results found.   Assessment/Plan 1. 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  The patient does not voice lifestyle limiting changes at this point in time.   Noninvasive studies do not suggest clinically significant change.   No invasive studies, angiography or surgery at this time.  I did discuss that in two years I believe that a CT angiogram would be appropriate to more thoroughly  evaluate the aorta and iliac arteries.  The patient should continue walking and begin a more formal exercise program.  The patient should continue antiplatelet therapy and aggressive treatment of the lipid abnormalities   No changes in the patient's medications at this time   The patient should continue wearing graduated compression socks 10-15 mmHg strength to control the mild edema.  - VAS [place] ABI WITH/WO TBI - VAS [place] ABI WITH/WO TBI; Future  2. Deep vein thrombosis (DVT) of popliteal vein, unspecified chronicity, unspecified laterality (HCC) Recommend:   No surgery or intervention at this point in time.  IVC filter is not indicated at present.  I have personally reviewed the CT angiogram of the chest from October 2023.  As I discussed with him no evidence for PE.  Just as important there is no evidence of arterial abnormality of the aorta or origins of the great vessels.  The patient has completed anticoagulation   Elevation was stressed, such as the use of a recliner.  I have discussed  DVT and post phlebitic changes such as swelling and why it  causes symptoms such as pain.  The  patient should wear graduated compression stockings.  The compression should be worn on a daily basis. The patient should wear the stockings first thing in the morning and removing them in the evening. The patient should not to sleep in the stockings.  In addition, behavioral modification including elevation during the day and avoidance of prolonged dependency will be initiated.    The patient will remain on antiplatelet therapy for now as there have not been any problems or complications at this point.    3. Mixed hyperlipidemia Continue statin as ordered and reviewed, no changes at this time     Levora Dredge, MD  02/12/2022 2:20 PM

## 2022-02-14 ENCOUNTER — Encounter (INDEPENDENT_AMBULATORY_CARE_PROVIDER_SITE_OTHER): Payer: Self-pay | Admitting: Vascular Surgery

## 2022-02-14 ENCOUNTER — Ambulatory Visit (INDEPENDENT_AMBULATORY_CARE_PROVIDER_SITE_OTHER): Payer: PRIVATE HEALTH INSURANCE

## 2022-02-14 ENCOUNTER — Ambulatory Visit (INDEPENDENT_AMBULATORY_CARE_PROVIDER_SITE_OTHER): Payer: 59 | Admitting: Vascular Surgery

## 2022-02-14 VITALS — BP 123/76 | HR 62 | Resp 16 | Ht 69.0 in | Wt 304.0 lb

## 2022-02-14 DIAGNOSIS — E782 Mixed hyperlipidemia: Secondary | ICD-10-CM

## 2022-02-14 DIAGNOSIS — I743 Embolism and thrombosis of arteries of the lower extremities: Secondary | ICD-10-CM | POA: Diagnosis not present

## 2022-02-14 DIAGNOSIS — I82439 Acute embolism and thrombosis of unspecified popliteal vein: Secondary | ICD-10-CM

## 2022-02-19 ENCOUNTER — Encounter: Payer: Self-pay | Admitting: Emergency Medicine

## 2022-02-19 ENCOUNTER — Emergency Department
Admission: EM | Admit: 2022-02-19 | Discharge: 2022-02-19 | Disposition: A | Discharge status: Home / Self Care | Payer: Managed Care, Other (non HMO) | Attending: Emergency Medicine | Admitting: Emergency Medicine

## 2022-02-19 ENCOUNTER — Emergency Department: Payer: Managed Care, Other (non HMO)

## 2022-02-19 DIAGNOSIS — R29898 Other symptoms and signs involving the musculoskeletal system: Secondary | ICD-10-CM | POA: Diagnosis not present

## 2022-02-19 DIAGNOSIS — Z20822 Contact with and (suspected) exposure to covid-19: Secondary | ICD-10-CM | POA: Insufficient documentation

## 2022-02-19 DIAGNOSIS — M2540 Effusion, unspecified joint: Secondary | ICD-10-CM | POA: Diagnosis present

## 2022-02-19 LAB — RESP PANEL BY RT-PCR (FLU A&B, COVID) ARPGX2
Influenza A by PCR: NEGATIVE
Influenza B by PCR: NEGATIVE
SARS Coronavirus 2 by RT PCR: NEGATIVE

## 2022-02-19 NOTE — ED Provider Notes (Signed)
[facility] Provider Note    Event Date/Time   First MD Initiated Contact with Patient 02/19/22 [phone]     (approximate)   History   Joint Swelling   HPI  Thomas Hopkins is a 36 y.o. male who presents to the ED for evaluation of Joint Swelling   I reviewed the vascular surgery visit from 12/4.  He has a history of DVT and has been on Eliquis in the past, has been years since his last blood clot.  Currently on clopidogrel only.  He reports noticing a fullness to his left popliteal space last night when he got out of the shower, so he presents this morning to make sure it is okay.  Denies any falls, injuries, skin discoloration, chest pain, shortness of breath.  No pain   Physical Exam   Triage Vital Signs: ED Triage Vitals [02/19/22 0618]  Enc Vitals Group     BP 127/82     Pulse Rate 68     Resp 18     Temp 98.3 F (36.8 C)     Temp Source Oral     SpO2 98 %     Weight      Height      Head Circumference      Peak Flow      Pain Score 0     Pain Loc      Pain Edu?      Excl. in [place]?     Most recent vital signs: Vitals:   02/19/22 0618  BP: 127/82  Pulse: 68  Resp: 18  Temp: 98.3 F (36.8 C)  SpO2: 98%    General: Awake, no distress.  CV:  Good peripheral perfusion.  Resp:  Normal effort.  Abd:  No distention.  MSK:  No deformity noted.  Difficult to appreciate a very mild fullness to his left popliteal space with compared to the right.  No skin discoloration, signs of trauma, induration, fluctuance.  Seems to be superficial limited to the soft tissue. Almost reminiscent of a small Baker's cyst. Neuro:  No focal deficits appreciated. Other:     ED Results / Procedures / Treatments   Labs (all labs ordered are listed, but only abnormal results are displayed) Labs Reviewed  RESP PANEL BY RT-PCR (FLU A&B, COVID) [id number]    EKG   RADIOLOGY Ultrasound interpreted interpreted by me without evidence of DVT.  Official  radiology report(s): US Venous Img Lower Unilateral Left  Result Date: 02/19/2022 CLINICAL DATA:  Left knee pain EXAM: LEFT LOWER EXTREMITY VENOUS DOPPLER ULTRASOUND TECHNIQUE: Gray-scale sonography with compression, as well as color and duplex ultrasound, were performed to evaluate the deep venous system(s) from the level of the common femoral vein through the popliteal and proximal calf veins. COMPARISON:  None Available. FINDINGS: VENOUS Normal compressibility of the common femoral, superficial femoral, and popliteal veins, as well as the visualized calf veins. Visualized portions of profunda femoral vein and great saphenous vein unremarkable. No filling defects to suggest DVT on grayscale or color Doppler imaging. Doppler waveforms show normal direction of venous flow, normal respiratory plasticity and response to augmentation. Limited views of the contralateral common femoral vein are unremarkable. OTHER None. Limitations: none IMPRESSION: Negative. Electronically Signed   By: Jacqulynn Cadet M.D.   On: 02/19/2022 06:56    PROCEDURES and INTERVENTIONS:  Procedures  Medications - No data to display   IMPRESSION / MDM / [place] / ED COURSE  I  reviewed the triage vital signs and the nursing notes.  Differential diagnosis includes, but is not limited to, DVT, Baker's cyst, cellulitis  36 year old male presents to the ED with concerns for a possible DVT, without evidence of such, and suitable for outpatient management.  Look systemically well.  Has some very mild soft tissue superficial fullness to his left popliteal space that is difficult to even appreciate.  He looks well systemically.  Ultrasound without DVT.  Doubt Baker's cyst.  I see no indications for further diagnostics here in the ED.  Suitable for outpatient management.  Discussed return precautions.      FINAL CLINICAL IMPRESSION(S) / ED DIAGNOSES   Final diagnoses:  Popliteal fullness     Rx / DC Orders   ED  Discharge Orders     None        Note:  This document was prepared using Dragon voice recognition software and may include unintentional dictation errors.   Delton [place], MD 02/19/22 [phone]

## 2022-02-19 NOTE — ED Triage Notes (Signed)
Pt presents via POV with complaints of left knee swelling that started last night following a shower. Hx of vascular issues with blood clots - compliant with ASA & Plavix medication. Of note, the patient has seen his vascular provider last week and had pulse checks with a doppler which was unremarkable. Visible small lump on the posterior surface of his knee. Denies pain or injury.

## 2022-02-19 NOTE — ED Notes (Signed)
Pt verbalizes understanding of d/c instructions and follow up. 

## 2022-02-19 NOTE — Discharge Instructions (Signed)
No signs of blood clots or Baker's cyst in particular.  Keep track of this area and if you see any signs of further swelling, skin discoloration around it then please return for another study.

## 2022-05-15 ENCOUNTER — Encounter: Payer: Self-pay | Admitting: Intensive Care

## 2022-05-15 ENCOUNTER — Emergency Department: Payer: Managed Care, Other (non HMO)

## 2022-05-15 ENCOUNTER — Emergency Department
Admission: EM | Admit: 2022-05-15 | Discharge: 2022-05-15 | Disposition: A | Discharge status: Home / Self Care | Payer: Managed Care, Other (non HMO) | Attending: Emergency Medicine | Admitting: Emergency Medicine

## 2022-05-15 ENCOUNTER — Other Ambulatory Visit: Payer: Self-pay

## 2022-05-15 DIAGNOSIS — S39012A Strain of muscle, fascia and tendon of lower back, initial encounter: Secondary | ICD-10-CM | POA: Diagnosis not present

## 2022-05-15 DIAGNOSIS — M25561 Pain in right knee: Secondary | ICD-10-CM | POA: Diagnosis not present

## 2022-05-15 DIAGNOSIS — X500XXA Overexertion from strenuous movement or load, initial encounter: Secondary | ICD-10-CM | POA: Insufficient documentation

## 2022-05-15 DIAGNOSIS — K2900 Acute gastritis without bleeding: Secondary | ICD-10-CM | POA: Diagnosis not present

## 2022-05-15 DIAGNOSIS — F17200 Nicotine dependence, unspecified, uncomplicated: Secondary | ICD-10-CM | POA: Insufficient documentation

## 2022-05-15 DIAGNOSIS — S3992XA Unspecified injury of lower back, initial encounter: Secondary | ICD-10-CM | POA: Diagnosis present

## 2022-05-15 LAB — COMPREHENSIVE METABOLIC PANEL
ALT: 45 U/L — ABNORMAL HIGH (ref 0–44)
AST: 35 U/L (ref 15–41)
Albumin: 3.8 g/dL (ref 3.5–5.0)
Alkaline Phosphatase: 52 U/L (ref 38–126)
Anion gap: 9 (ref 5–15)
BUN: 9 mg/dL (ref 6–20)
CO2: 24 mmol/L (ref 22–32)
Calcium: 8.8 mg/dL — ABNORMAL LOW (ref 8.9–10.3)
Chloride: 103 mmol/L (ref 98–111)
Creatinine, Ser: 0.76 mg/dL (ref 0.61–1.24)
GFR, Estimated: >60 mL/min (ref >60)
Glucose, Bld: 123 mg/dL — ABNORMAL HIGH (ref 70–99)
Potassium: 3.8 mmol/L (ref 3.5–5.1)
Sodium: 136 mmol/L (ref 135–145)
Total Bilirubin: 0.5 mg/dL (ref 0.3–1.2)
Total Protein: 6.7 g/dL (ref 6.5–8.1)

## 2022-05-15 LAB — CBC
HCT: 46.4 % (ref 39.0–52.0)
Hemoglobin: 15.4 g/dL (ref 13.0–17.0)
MCH: 29.7 pg (ref 26.0–34.0)
MCHC: 33.2 g/dL (ref 30.0–36.0)
MCV: 89.4 fL (ref 80.0–100.0)
Platelets: 283 10*3/uL (ref 150–400)
RBC: 5.19 MIL/uL (ref 4.22–5.81)
RDW: 13.4 % (ref 11.5–15.5)
WBC: 8.2 10*3/uL (ref 4.0–10.5)
nRBC: 0 % (ref 0.0–0.2)

## 2022-05-15 LAB — LIPASE, BLOOD: Lipase: 32 U/L (ref 11–51)

## 2022-05-15 MED ORDER — FAMOTIDINE 20 MG PO TABS
20.0000 mg | ORAL_TABLET | Freq: Once | ORAL | Status: AC
  Administered 2022-05-15: 20 mg via ORAL
  Filled 2022-05-15: qty 1

## 2022-05-15 MED ORDER — OMEPRAZOLE MAGNESIUM 20 MG PO TBEC: 20.0000 mg | DELAYED_RELEASE_TABLET | Freq: Every day | ORAL | 0 refills | Status: DC

## 2022-05-15 MED ORDER — ALUM & MAG HYDROXIDE-SIMETH 200-200-20 MG/5ML PO SUSP
30.0000 mL | Freq: Once | ORAL | Status: AC
  Administered 2022-05-15: 30 mL via ORAL
  Filled 2022-05-15: qty 30

## 2022-05-15 NOTE — ED Triage Notes (Signed)
Patient c/o all over back pain. His job is strenuous with heavy lifting and reports he has chronic back pain but has flared up these past few days. Also c/o abdominal pain due to being diagnosed with ulcer and pain is severe when he eats certain things.

## 2022-05-15 NOTE — ED Provider Notes (Signed)
[facility] Provider Note    Event Date/Time   First MD Initiated Contact with Patient 05/15/22 1154     (approximate)   History   Back Pain and Abdominal Pain   HPI  Thomas Hopkins is a 37 y.o. male who presents today for evaluation of epigastric pain, and secondarily of back pain.  Patient reports that he has had epigastric burning sensation for several months, for which he occasionally takes Pepcid.  He reports that he had salsa and his symptoms worsened.  He has not had any nausea or vomiting.  No lower abdominal pain.  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  He denies radiation of pain into his legs.  No urinary or fecal incontinence or retention.  No saddle anesthesia.  Thirdly, he reports that he had a trip and fall last week and landed directly on his right knee and wants to "get this checked out as well."  He denies paresthesias.  He has been able to ambulate without difficulty.  He denies any swelling or skin changes.  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   Hyperlipidemia 03/19/2017   Atherosclerosis of aorta ([place]) 01/27/2017   Ischemic foot pain at rest 11/18/2016   Tobacco use disorder 05/04/2016   Acute venous embolism and thrombosis of deep vessels of distal lower extremity ([place]) 10/22/2014   Deep venous thrombosis ([place]) 10/22/2014   RLQ abdominal pain    Abdominal pain, acute, right lower quadrant 10/13/2014          Physical Exam   Triage Vital Signs: ED Triage Vitals  Enc Vitals Group     BP 05/15/22 1123 (!) 145/91     Pulse Rate 05/15/22 1123 75     Resp 05/15/22 1123 18     Temp 05/15/22 1120 98.1 F (36.7 C)     Temp Source 05/15/22 1120 Oral     SpO2 05/15/22 1123 96 %     Weight 05/15/22 1123 (!) 310 lb (140.6 kg)      Height 05/15/22 1123 '5\' 9"'$  (1.753 m)     Head Circumference --      Peak Flow --      Pain Score 05/15/22 1123 7     Pain Loc --      Pain Edu? --      Excl. in [place]? --     Most recent vital signs: Vitals:   05/15/22 1120 05/15/22 1123  BP:  (!) 145/91  Pulse:  75  Resp:  18  Temp: 98.1 F (36.7 C)   SpO2:  96%    Physical Exam Vitals and nursing note reviewed.  Constitutional:      General: Awake and alert. No acute distress.    Appearance: Normal appearance. The patient is normal weight.  HENT:     Head: Normocephalic and atraumatic.     Mouth: Mucous membranes are moist.  Eyes:     General: PERRL. Normal EOMs        Right eye: No discharge.        Left eye: No discharge.     Conjunctiva/sclera: Conjunctivae normal.  Cardiovascular:     Rate and Rhythm: Normal rate and regular rhythm.     Pulses: Normal pulses.     Heart  sounds: Normal heart sounds Pulmonary:     Effort: Pulmonary effort is normal. No respiratory distress.     Breath sounds: Normal breath sounds.  Abdominal:     Abdomen is soft. There is no abdominal tenderness. No rebound or guarding. No distention. Musculoskeletal:        General: No swelling. Normal range of motion.     Cervical back: Normal range of motion and neck supple.  Back: No midline tenderness. Strength and sensation 5/5 to bilateral lower extremities. Normal great toe extension against resistance. Normal sensation throughout feet. Normal patellar reflexes. Negative SLR and opposite SLR bilaterally. Negative FABER test Right knee: No deformity or rash. No joint line tenderness. No patellar tenderness, no ballotment Warm and well perfused extremity with 2+ pedal pulses 5/5 strength to dorsiflexion and plantarflexion at the ankle with intact sensation throughout extremity Normal range of motion of the knee, with intact flexion and extension to active and passive range of motion. Extensor mechanism intact. No ligamentous laxity. Negative  anterior/posterior drawer/negative lachman, negative mcmurrays No effusion or warmth Intact quadriceps, hamstring function, patellar tendon function Pelvis stable Full ROM of ankle without pain or swelling Foot warm and well perfused Skin:    General: Skin is warm and dry.     Capillary Refill: Capillary refill takes less than 2 seconds.     Findings: No rash.  Neurological:     Mental Status: The patient is awake and alert.      ED Results / Procedures / Treatments   Labs (all labs ordered are listed, but only abnormal results are displayed) Labs Reviewed  COMPREHENSIVE METABOLIC PANEL - Abnormal; Notable for the following components:      Result Value   Glucose, Bld 123 (*)    Calcium 8.8 (*)    ALT 45 (*)    All other components within normal limits  LIPASE, BLOOD  CBC     EKG     RADIOLOGY I independently reviewed and interpreted imaging and agree with radiologists findings.     PROCEDURES:  Critical Care performed:   Procedures   MEDICATIONS ORDERED IN ED: Medications  alum & mag hydroxide-simeth (MAALOX/MYLANTA) 200-200-20 MG/5ML suspension 30 mL (30 mLs Oral Given 05/15/22 1229)  famotidine (PEPCID) tablet 20 mg (20 mg Oral Given 05/15/22 1228)     IMPRESSION / MDM / ASSESSMENT AND PLAN / ED COURSE  I reviewed the triage vital signs and the nursing notes.   Differential diagnosis includes, but is not limited to, gastritis, biliary colic, cholelithiasis, pancreatitis, peptic ulcer disease, H. Pylori, low back pain, sciatica, musculoskeletal spasm versus strain.  Patient is awake and alert, hemodynamically stable and afebrile.  Burning nature of pain in the setting of recent acidic/spicy food intake is suspicious for gastritis versus peptic ulcer disease.  No blood in his stool, no vomiting.  He is also on Plavix which increases his risk for gastric irritation.  He was treated with Pepcid and GI cocktail with resolution of his pain.  His abdomen is soft  and nontender, I do not suspect perforation.  He was started on omeprazole and instructed to not take this at the same time as his Plavix.  I also stressed the importance of close outpatient follow-up with gastroenterology to schedule an EGD, and the appropriate follow-up information was provided.  He agrees to call tomorrow.  As for his knee, patient has full and normal range of motion with active and passive range of motion of his knee.  There  is no obvious deformity, ligamental laxity, or skin changes noted.  His x-ray is negative for any acute findings.  He is able to ambulate with a steady gait.  Patient has tenderness to his lumbar and thoracic paraspinal muscles.  He has no midline tenderness.  He requested x-rays "to be sure."  X-rays were negative.  He has full normal strength and sensation in bilateral lower extremities.  No signs or symptoms of cord compression.  No fevers or history of IV drug use.  We discussed symptomatic management and strict return precautions.  Patient understands and agrees with plan.  He was discharged in stable condition.   Patient's presentation is most consistent with acute complicated illness / injury requiring diagnostic workup.   FINAL CLINICAL IMPRESSION(S) / ED DIAGNOSES   Final diagnoses:  Acute gastritis without hemorrhage, unspecified gastritis type  Strain of lumbar region, initial encounter  Acute pain of right knee     Rx / DC Orders   ED Discharge Orders          Ordered    omeprazole (PRILOSEC OTC) 20 MG tablet  Daily,   Status:  Discontinued        05/15/22 1245    omeprazole (PRILOSEC OTC) 20 MG tablet  Daily        05/15/22 1245             Note:  This document was prepared using Dragon voice recognition software and may include unintentional dictation errors.   Marquette Old, PA-C 05/15/22 1409    Nathaniel Man, MD 05/15/22 1521

## 2022-05-15 NOTE — ED Notes (Signed)
Patient taken to imaging. 

## 2022-05-15 NOTE — Discharge Instructions (Addendum)
Take the omeprazole as prescribed though do not take with your Plavix.  Please follow-up with gastroenterology as you need to have an EGD.  Call them tomorrow to arrange an appointment.  Please return for any new, worsening, or change in symptoms or other concerns.

## 2022-05-23 NOTE — Progress Notes (Unsigned)
There were no vitals taken for this visit.   Subjective:    Patient ID: Thomas Hopkins, male    DOB: 1985/06/09, 37 y.o.   MRN: [id number]  HPI: Thomas Hopkins is a 37 y.o. male  No chief complaint on file.  Patient presents to clinic to establish care with new PCP.  Introduced to Designer, jewellery role and practice setting.  All questions answered.  Discussed provider/patient relationship and expectations.  Patient reports a history of ***. Patient denies a history of: Hypertension, Elevated Cholesterol, Diabetes, Thyroid problems, Depression, Anxiety, Neurological problems, and Abdominal problems.   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[place]) 03/19/2017   Hyperlipidemia 03/19/2017   Morbid obesity ([place]) 06/17/2019   Tobacco use disorder 05/04/2016   Resolved Ambulatory Problems    Diagnosis Date Noted   No Resolved Ambulatory Problems   Past Medical History:  Diagnosis Date   DVT of lower extremity, bilateral (HCC)    GERD (gastroesophageal reflux disease)      Past Surgical History:  Procedure Laterality Date   ABDOMINAL AORTOGRAM W/LOWER EXTREMITY Right 11/18/2016   Procedure: ABDOMINAL AORTOGRAM W/LOWER EXTREMITY;  Surgeon: Katha Cabal, MD;  Location: [facility];  Service: Cardiovascular;  Laterality: Right;   APPENDECTOMY     LAPAROSCOPIC APPENDECTOMY N/A 10/14/2014   Procedure: APPENDECTOMY LAPAROSCOPIC;  Surgeon: Florene Glen, MD;  Location: [facility];  Service: General;  Laterality: N/A;   TONSILLECTOMY     WISDOM TOOTH EXTRACTION       Review of Systems  Per HPI  unless specifically indicated above     Objective:    There were no vitals taken for this visit.  Wt Readings from Last 3 Encounters:  05/15/22 (!) 310 lb (140.6 kg)  02/14/22 (!) 304 lb (137.9 kg)  12/17/21 (!) 310 lb (140.6 kg)    Physical Exam  Results for orders placed or performed during the hospital encounter of 05/15/22  Lipase, blood  Result Value Ref Range   Lipase 32 11 - 51 U/L  Comprehensive metabolic panel  Result Value Ref Range   Sodium 136 135 - 145 mmol/L   Potassium 3.8 3.5 - 5.1 mmol/L   Chloride 103 98 - 111 mmol/L   CO2 24 22 - 32 mmol/L   Glucose, Bld 123 (H) 70 - 99 mg/dL   BUN 9 6 - 20 mg/dL   Creatinine, Ser 0.76 0.61 - 1.24 mg/dL   Calcium 8.8 (L) 8.9 - 10.3 mg/dL   Total Protein 6.7 6.5 - 8.1 g/dL   Albumin 3.8 3.5 - 5.0 g/dL   AST 35 15 - 41 U/L   ALT 45 (H) 0 - 44 U/L   Alkaline Phosphatase 52 38 - 126 U/L   Total Bilirubin 0.5 0.3 - 1.2 mg/dL   GFR, Estimated >60 >60 mL/min   Anion gap 9 5 - 15  CBC  Result Value Ref Range   WBC 8.2 4.0 - 10.5 K/uL   RBC 5.19 4.22 - 5.81 MIL/uL   Hemoglobin 15.4 13.0 - 17.0 g/dL   HCT 46.4 39.0 - 52.0 %  MCV 89.4 80.0 - 100.0 fL   MCH 29.7 26.0 - 34.0 pg   MCHC 33.2 30.0 - 36.0 g/dL   RDW 13.4 11.5 - 15.5 %   Platelets 283 150 - 400 K/uL   nRBC 0.0 0.0 - 0.2 %      Assessment & Plan:   Problem List Items Addressed This Visit   None    Follow up plan: No follow-ups on file.

## 2022-05-24 ENCOUNTER — Encounter: Payer: Self-pay | Admitting: Nurse Practitioner

## 2022-05-24 ENCOUNTER — Ambulatory Visit (INDEPENDENT_AMBULATORY_CARE_PROVIDER_SITE_OTHER): Payer: 59 | Admitting: Nurse Practitioner

## 2022-05-24 DIAGNOSIS — R7309 Other abnormal glucose: Secondary | ICD-10-CM

## 2022-05-24 DIAGNOSIS — F17200 Nicotine dependence, unspecified, uncomplicated: Secondary | ICD-10-CM

## 2022-05-24 DIAGNOSIS — K219 Gastro-esophageal reflux disease without esophagitis: Secondary | ICD-10-CM

## 2022-05-24 DIAGNOSIS — I743 Embolism and thrombosis of arteries of the lower extremities: Secondary | ICD-10-CM | POA: Diagnosis not present

## 2022-05-24 DIAGNOSIS — I700 Atherosclerosis of aorta: Secondary | ICD-10-CM | POA: Diagnosis not present

## 2022-05-24 DIAGNOSIS — E782 Mixed hyperlipidemia: Secondary | ICD-10-CM

## 2022-05-24 DIAGNOSIS — M4300 Spondylolysis, site unspecified: Secondary | ICD-10-CM

## 2022-05-24 MED ORDER — OMEPRAZOLE MAGNESIUM 20 MG PO TBEC: 20.0000 mg | DELAYED_RELEASE_TABLET | Freq: Every day | ORAL | 0 refills | Status: DC

## 2022-05-24 MED ORDER — ATORVASTATIN CALCIUM 40 MG PO TABS: ORAL_TABLET | ORAL | 1 refills | Status: DC

## 2022-05-24 NOTE — Assessment & Plan Note (Signed)
Currently smoking 1ppd or less.  Not ready to quit.

## 2022-05-24 NOTE — Assessment & Plan Note (Signed)
Chronic.  Controlled.  Continue with current medication regimen of Atorvastatin '20mg'$ .  Increased dose to Atorvastatin to '40mg'$  for full benefits of medication.  Refills sent.  Labs ordered today.  Return to clinic in 3 months for reevaluation.  Call sooner if concerns arise.

## 2022-05-24 NOTE — Assessment & Plan Note (Signed)
Chronic.  Controlled.  Continue with current medication regimen of Plavix.  Last clot was in 2018.  Followed by Vascular.  Will get imaging in December 2025.  Labs ordered today.  Return to clinic in 6 months for reevaluation.  Call sooner if concerns arise.

## 2022-05-24 NOTE — Assessment & Plan Note (Signed)
Recommended eating smaller high protein, low fat meals more frequently and exercising 30 mins a day 5 times a week with a goal of 10-15lb weight loss in the next 3 months.  

## 2022-05-25 LAB — COMPREHENSIVE METABOLIC PANEL
ALT: 42 IU/L (ref 0–44)
AST: 25 IU/L (ref 0–40)
Albumin/Globulin Ratio: 2 (ref 1.2–2.2)
Albumin: 4.5 g/dL (ref 4.1–5.1)
Alkaline Phosphatase: 67 IU/L (ref 44–121)
BUN/Creatinine Ratio: 18 (ref 9–20)
BUN: 13 mg/dL (ref 6–20)
Bilirubin Total: 0.2 mg/dL (ref 0.0–1.2)
CO2: 22 mmol/L (ref 20–29)
Calcium: 8.8 mg/dL (ref 8.7–10.2)
Chloride: 104 mmol/L (ref 96–106)
Creatinine, Ser: 0.72 mg/dL — ABNORMAL LOW (ref 0.76–1.27)
Globulin, Total: 2.3 g/dL (ref 1.5–4.5)
Glucose: 66 mg/dL — ABNORMAL LOW (ref 70–99)
Potassium: 4.5 mmol/L (ref 3.5–5.2)
Sodium: 140 mmol/L (ref 134–144)
Total Protein: 6.8 g/dL (ref 6.0–8.5)
eGFR: 121 mL/min/{1.73_m2} (ref >59)

## 2022-05-25 LAB — HEMOGLOBIN A1C
Est. average glucose Bld gHb Est-mCnc: 117 mg/dL
Hgb A1c MFr Bld: 5.7 % — ABNORMAL HIGH (ref 4.8–5.6)

## 2022-05-25 LAB — LIPID PANEL
Chol/HDL Ratio: 5.5 ratio — ABNORMAL HIGH (ref 0.0–5.0)
Cholesterol, Total: 210 mg/dL — ABNORMAL HIGH (ref 100–199)
HDL: 38 mg/dL — ABNORMAL LOW (ref >39)
LDL Chol Calc (NIH): 123 mg/dL — ABNORMAL HIGH (ref 0–99)
Triglycerides: 277 mg/dL — ABNORMAL HIGH (ref 0–149)
VLDL Cholesterol Cal: 49 mg/dL — ABNORMAL HIGH (ref 5–40)

## 2022-05-25 NOTE — Progress Notes (Signed)
Please let patient know that his lab work shows that he is prediabetic.  I recommend changing his diet to decrease soda intake, processed foods and refined sugar.  His cholesterol is elevated.  I recommend increasing the dose of atorvastatin like we discussed during the visit.  No other concerns a this time.  Follow up as discussed.

## 2022-08-24 ENCOUNTER — Ambulatory Visit (INDEPENDENT_AMBULATORY_CARE_PROVIDER_SITE_OTHER): Payer: Managed Care, Other (non HMO) | Admitting: Nurse Practitioner

## 2022-08-24 ENCOUNTER — Encounter: Payer: Self-pay | Admitting: Nurse Practitioner

## 2022-08-24 VITALS — BP 122/80 | HR 58 | Temp 97.9°F | Ht 68.0 in | Wt 301.6 lb

## 2022-08-24 DIAGNOSIS — I700 Atherosclerosis of aorta: Secondary | ICD-10-CM

## 2022-08-24 DIAGNOSIS — Z0000 Encounter for general adult medical examination without abnormal findings: Secondary | ICD-10-CM | POA: Diagnosis not present

## 2022-08-24 DIAGNOSIS — M4300 Spondylolysis, site unspecified: Secondary | ICD-10-CM

## 2022-08-24 DIAGNOSIS — Z114 Encounter for screening for human immunodeficiency virus [HIV]: Secondary | ICD-10-CM | POA: Diagnosis not present

## 2022-08-24 DIAGNOSIS — M7989 Other specified soft tissue disorders: Secondary | ICD-10-CM

## 2022-08-24 DIAGNOSIS — Z1159 Encounter for screening for other viral diseases: Secondary | ICD-10-CM

## 2022-08-24 DIAGNOSIS — Z23 Encounter for immunization: Secondary | ICD-10-CM

## 2022-08-24 DIAGNOSIS — E782 Mixed hyperlipidemia: Secondary | ICD-10-CM | POA: Diagnosis not present

## 2022-08-24 LAB — URINALYSIS, ROUTINE W REFLEX MICROSCOPIC
Bilirubin, UA: NEGATIVE
Glucose, UA: NEGATIVE
Ketones, UA: NEGATIVE
Leukocytes,UA: NEGATIVE
Nitrite, UA: NEGATIVE
Protein,UA: NEGATIVE
Specific Gravity, UA: 1.025 (ref 1.005–1.030)
Urobilinogen, Ur: 0.2 mg/dL (ref 0.2–1.0)
pH, UA: 5.5 (ref 5.0–7.5)

## 2022-08-24 LAB — MICROSCOPIC EXAMINATION
Bacteria, UA: NONE SEEN
Epithelial Cells (non renal): NONE SEEN /hpf (ref 0–10)
WBC, UA: NONE SEEN /hpf (ref 0–5)

## 2022-08-24 MED ORDER — ATORVASTATIN CALCIUM 40 MG PO TABS: ORAL_TABLET | ORAL | 1 refills | Status: DC

## 2022-08-24 NOTE — Progress Notes (Deleted)
   There were no vitals taken for this visit.   Subjective:    Patient ID: Thomas Hopkins, male    DOB: [birth year], 37 y.o.   MRN: [id number]  HPI: Thomas Hopkins is a 37 y.o. male  No chief complaint on file.   Relevant past medical, surgical, family and social history reviewed and updated as indicated. Interim medical history since our last visit reviewed. Allergies and medications reviewed and updated.  Review of Systems  Per HPI unless specifically indicated above     Objective:    There were no vitals taken for this visit.  Wt Readings from Last 3 Encounters:  05/24/22 (!) 305 lb 9.6 oz (138.6 kg)  05/15/22 (!) 310 lb (140.6 kg)  02/14/22 (!) 304 lb (137.9 kg)    Physical Exam  Results for orders placed or performed in visit on 05/24/22  Comp Met (CMET)  Result Value Ref Range   Glucose 66 (L) 70 - 99 mg/dL   BUN 13 6 - 20 mg/dL   Creatinine, Ser 8.29 (L) 0.76 - 1.27 mg/dL   eGFR 562 >13 YQ/MVH/8.46   BUN/Creatinine Ratio 18 9 - 20   Sodium 140 134 - 144 mmol/L   Potassium 4.5 3.5 - 5.2 mmol/L   Chloride 104 96 - 106 mmol/L   CO2 22 20 - 29 mmol/L   Calcium 8.8 8.7 - 10.2 mg/dL   Total Protein 6.8 6.0 - 8.5 g/dL   Albumin 4.5 4.1 - 5.1 g/dL   Globulin, Total 2.3 1.5 - 4.5 g/dL   Albumin/Globulin Ratio 2.0 1.2 - 2.2   Bilirubin Total 0.2 0.0 - 1.2 mg/dL   Alkaline Phosphatase 67 44 - 121 IU/L   AST 25 0 - 40 IU/L   ALT 42 0 - 44 IU/L  HgB A1c  Result Value Ref Range   Hgb A1c MFr Bld 5.7 (H) 4.8 - 5.6 %   Est. average glucose Bld gHb Est-mCnc 117 mg/dL  Lipid Profile  Result Value Ref Range   Cholesterol, Total 210 (H) 100 - 199 mg/dL   Triglycerides 962 (H) 0 - 149 mg/dL   HDL 38 (L) >95 mg/dL   VLDL Cholesterol Cal 49 (H) 5 - 40 mg/dL   LDL Chol Calc (NIH) 284 (H) 0 - 99 mg/dL   Chol/HDL Ratio 5.5 (H) 0.0 - 5.0 ratio      Assessment & Plan:   Problem List Items Addressed This Visit   None    Follow up plan: No follow-ups on  file.

## 2022-08-24 NOTE — Assessment & Plan Note (Signed)
Chronic.  Controlled.  Continue with current medication regimen of Atorvastatin daily.  Refills sent today.  Labs ordered today.  Return to clinic in 6 months for reevaluation.  Call sooner if concerns arise.  ° °

## 2022-08-24 NOTE — Progress Notes (Signed)
BP 122/80   Pulse (!) 58   Temp 97.9 F (36.6 C) (Oral)   Ht 5\' 8"  (1.727 m)   Wt (!) 301 lb 9.6 oz (136.8 kg)   SpO2 96%   BMI 45.86 kg/m    Subjective:    Patient ID: Thomas Hopkins, male    DOB: 1985/05/27, 37 y.o.   MRN: [id number]  HPI: Thomas Hopkins is a 37 y.o. male presenting on 08/24/2022 for comprehensive medical examination. Current medical complaints include: swelling in lower extremities  He currently lives with: Interim Problems from his last visit: no   Denies HA, CP, SOB, dizziness, palpitations, visual changes. He is having some lower extremity swelling that resolves at night.  Not drinking a lot of water.    HYPERLIPIDEMIA Hyperlipidemia status: excellent compliance Satisfied with current treatment?  no Side effects:  no Medication compliance: excellent compliance Past cholesterol meds: atorvastain (lipitor) Supplements: none Aspirin:  no The ASCVD Risk score (Arnett DK, et al., 2019) failed to calculate for the following reasons:   The 2019 ASCVD risk score is only valid for ages 33 to 96 Chest pain:  no Coronary artery disease:  no Family history CAD:  no Family history early CAD:  no  Patient states he hasn't seen anyone for his back pain.  However, the pain is getting worse.  Would like to see someone for his pain.   Depression Screen done today and results listed below:     08/24/2022    9:44 AM 05/24/2022    8:59 AM  Depression screen PHQ 2/9  Decreased Interest 0 0  Down, Depressed, Hopeless 0 0  PHQ - 2 Score 0 0  Altered sleeping 1 0  Tired, decreased energy 1 2  Change in appetite 0 0  Feeling bad or failure about yourself  0 0  Trouble concentrating 0 0  Moving slowly or fidgety/restless 0 0  Suicidal thoughts 0 0  PHQ-9 Score 2 2  Difficult doing work/chores Somewhat difficult Not difficult at all    The patient does not have a history of falls. I did complete a risk assessment for falls. A plan of care for falls  was documented.   Past Medical History:  Past Medical History:  Diagnosis Date   DVT of lower extremity, bilateral (HCC)    stopped Eliquis in 2017   GERD (gastroesophageal reflux disease)    Hyperlipidemia     Surgical History:  Past Surgical History:  Procedure Laterality Date   ABDOMINAL AORTOGRAM W/LOWER EXTREMITY Right 11/18/2016   Procedure: ABDOMINAL AORTOGRAM W/LOWER EXTREMITY;  Surgeon: Renford Dills, MD;  Location: [facility]IVE [facility];  Service: Cardiovascular;  Laterality: Right;   APPENDECTOMY     LAPAROSCOPIC APPENDECTOMY N/A 10/14/2014   Procedure: APPENDECTOMY LAPAROSCOPIC;  Surgeon: Lattie Haw, MD;  Location: [facility] ORS;  Service: General;  Laterality: N/A;   TONSILLECTOMY     WISDOM TOOTH EXTRACTION      Medications:  Current Outpatient Medications on File Prior to Visit  Medication Sig   acetaminophen (TYLENOL) 500 MG tablet Take 500 mg by mouth every 6 (six) hours as needed.   aspirin 325 MG tablet Take 325 mg by mouth daily.   clopidogrel (PLAVIX) 75 MG tablet TAKE 1 TABLET BY MOUTH EVERY DAY   omeprazole (PRILOSEC OTC) 20 MG tablet Take 1 tablet (20 mg total) by mouth daily.   No current facility-administered medications on file prior to visit.    Allergies:  Allergies  Allergen Reactions   Bee Venom Hives   Morphine Nausea Only   Vicodin [Hydrocodone-Acetaminophen] Nausea And Vomiting    Headache   Hydrocodone-Acetaminophen Nausea And Vomiting    Headache   Mushroom Extract Complex Nausea And Vomiting   Olive Oil Nausea And Vomiting    Social History:  Social History   Socioeconomic History   Marital status: Single    Spouse name: Not on file   Number of children: Not on file   Years of education: Not on file   Highest education level: Not on file  Occupational History   Not on file  Tobacco Use   Smoking status: Some Days    Packs/day: 0.25    Years: 15.00    Additional pack years: 0.00    Total pack years: 3.75     Types: Cigarettes   Smokeless tobacco: Never  Vaping Use   Vaping Use: Never used  Substance and Sexual Activity   Alcohol use: Yes    Comment: very occasionally   Drug use: No   Sexual activity: Not Currently  Other Topics Concern   Not on file  Social History Narrative   Not on file   Social Determinants of Health   Financial Resource Strain: Not on file  Food Insecurity: Not on file  Transportation Needs: Not on file  Physical Activity: Not on file  Stress: Not on file  Social Connections: Not on file  Intimate Partner Violence: Not on file   Social History   Tobacco Use  Smoking Status Some Days   Packs/day: 0.25   Years: 15.00   Additional pack years: 0.00   Total pack years: 3.75   Types: Cigarettes  Smokeless Tobacco Never   Social History   Substance and Sexual Activity  Alcohol Use Yes   Comment: very occasionally    Family History:  Family History  Problem Relation Age of Onset   Diabetes Mother    Rectal cancer Father    Scoliosis Brother    Diabetes Mellitus II Maternal Grandmother    Heart disease Maternal Grandfather     Past medical history, surgical history, medications, allergies, family history and social history reviewed with patient today and changes made to appropriate areas of the chart.   Review of Systems  Eyes:  Negative for blurred vision and double vision.  Respiratory:  Negative for shortness of breath.   Cardiovascular:  Positive for leg swelling. Negative for chest pain and palpitations.  Neurological:  Negative for dizziness and headaches.   All other ROS negative except what is listed above and in the HPI.      Objective:    BP 122/80   Pulse (!) 58   Temp 97.9 F (36.6 C) (Oral)   Ht 5\' 8"  (1.727 m)   Wt (!) 301 lb 9.6 oz (136.8 kg)   SpO2 96%   BMI 45.86 kg/m   Wt Readings from Last 3 Encounters:  08/24/22 (!) 301 lb 9.6 oz (136.8 kg)  05/24/22 (!) 305 lb 9.6 oz (138.6 kg)  05/15/22 (!) 310 lb (140.6 kg)     Physical Exam Vitals and nursing note reviewed.  Constitutional:      General: He is not in acute distress.    Appearance: Normal appearance. He is not ill-appearing, toxic-appearing or diaphoretic.  HENT:     Head: Normocephalic.     Right Ear: Tympanic membrane, ear canal and external ear normal.     Left Ear: Tympanic membrane, ear canal and  external ear normal.     Nose: Nose normal. No congestion or rhinorrhea.     Mouth/Throat:     Mouth: Mucous membranes are moist.  Eyes:     General:        Right eye: No discharge.        Left eye: No discharge.     Extraocular Movements: Extraocular movements intact.     Conjunctiva/sclera: Conjunctivae normal.     Pupils: Pupils are equal, round, and reactive to light.  Cardiovascular:     Rate and Rhythm: Normal rate and regular rhythm.     Heart sounds: No murmur heard. Pulmonary:     Effort: Pulmonary effort is normal. No respiratory distress.     Breath sounds: Normal breath sounds. No wheezing, rhonchi or rales.  Abdominal:     General: Abdomen is flat. Bowel sounds are normal. There is no distension.     Palpations: Abdomen is soft.     Tenderness: There is no abdominal tenderness. There is no guarding.  Musculoskeletal:     Cervical back: Normal range of motion and neck supple.     Right lower leg: No edema.     Left lower leg: No edema.  Skin:    General: Skin is warm and dry.     Capillary Refill: Capillary refill takes less than 2 seconds.  Neurological:     General: No focal deficit present.     Mental Status: He is alert and oriented to person, place, and time.     Cranial Nerves: No cranial nerve deficit.     Motor: No weakness.     Deep Tendon Reflexes: Reflexes normal.  Psychiatric:        Mood and Affect: Mood normal.        Behavior: Behavior normal.        Thought Content: Thought content normal.        Judgment: Judgment normal.     Results for orders placed or performed in visit on 05/24/22  Comp Met  (CMET)  Result Value Ref Range   Glucose 66 (L) 70 - 99 mg/dL   BUN 13 6 - 20 mg/dL   Creatinine, Ser 4.09 (L) 0.76 - 1.27 mg/dL   eGFR 811 >91 YN/WGN/5.62   BUN/Creatinine Ratio 18 9 - 20   Sodium 140 134 - 144 mmol/L   Potassium 4.5 3.5 - 5.2 mmol/L   Chloride 104 96 - 106 mmol/L   CO2 22 20 - 29 mmol/L   Calcium 8.8 8.7 - 10.2 mg/dL   Total Protein 6.8 6.0 - 8.5 g/dL   Albumin 4.5 4.1 - 5.1 g/dL   Globulin, Total 2.3 1.5 - 4.5 g/dL   Albumin/Globulin Ratio 2.0 1.2 - 2.2   Bilirubin Total 0.2 0.0 - 1.2 mg/dL   Alkaline Phosphatase 67 44 - 121 IU/L   AST 25 0 - 40 IU/L   ALT 42 0 - 44 IU/L  HgB A1c  Result Value Ref Range   Hgb A1c MFr Bld 5.7 (H) 4.8 - 5.6 %   Est. average glucose Bld gHb Est-mCnc 117 mg/dL  Lipid Profile  Result Value Ref Range   Cholesterol, Total 210 (H) 100 - 199 mg/dL   Triglycerides 130 (H) 0 - 149 mg/dL   HDL 38 (L) >86 mg/dL   VLDL Cholesterol Cal 49 (H) 5 - 40 mg/dL   LDL Chol Calc (NIH) 578 (H) 0 - 99 mg/dL   Chol/HDL Ratio 5.5 (H) 0.0 -  5.0 ratio      Assessment & Plan:   Problem List Items Addressed This Visit       Cardiovascular and Mediastinum   Atherosclerosis of aorta (HCC)    Chronic.  Controlled.  Continue with current medication regimen of Atorvastatin.  Refills sent today.  Labs ordered today.  Return to clinic in 6 months for reevaluation.  Call sooner if concerns arise.        Relevant Medications   atorvastatin (LIPITOR) 40 MG tablet     Other   Hyperlipidemia    Chronic.  Controlled.  Continue with current medication regimen of Atorvastatin daily.  Refills sent today.  Labs ordered today.  Return to clinic in 6 months for reevaluation.  Call sooner if concerns arise.        Relevant Medications   atorvastatin (LIPITOR) 40 MG tablet   Other Relevant Orders   Lipid panel   Morbid obesity (HCC)    Recommended eating smaller high protein, low fat meals more frequently and exercising 30 mins a day 5 times a week with a  goal of 10-15lb weight loss in the next 3 months.       Other Visit Diagnoses     Annual physical exam    -  Primary   Health maintenance reviewed during visit today.  Labs ordered.  TDAP given.   Relevant Orders   HIV Antibody (routine testing w rflx)   Hepatitis C Antibody   TSH   Lipid panel   CBC with Differential/Platelet   Comprehensive metabolic panel   Urinalysis, Routine w reflex microscopic   Screening for HIV (human immunodeficiency virus)       Relevant Orders   HIV Antibody (routine testing w rflx)   Encounter for hepatitis C screening test for low risk patient       Relevant Orders   Hepatitis C Antibody   Need for tetanus booster       Relevant Orders   Tdap vaccine greater than or equal to 7yo IM   Spondylolysis       referral placed for Orthopedics.   Relevant Orders   Ambulatory referral to Orthopedic Surgery   Swelling of lower leg       Recommend increasing water intake. Elevate legs.  Follow up if symptoms do not improve.        Discussed aspirin prophylaxis for myocardial infarction prevention and decision was it was not indicated  LABORATORY TESTING:  Health maintenance labs ordered today as discussed above.    IMMUNIZATIONS:   - Tdap: Tetanus vaccination status reviewed: last tetanus booster within 10 years. - Influenza: Postponed to flu season - Pneumovax: Not applicable - Prevnar: Not applicable - COVID: Not applicable - HPV: Not applicable - Shingrix vaccine: Not applicable  SCREENING: - Colonoscopy: Not applicable  Discussed with patient purpose of the colonoscopy is to detect colon cancer at curable precancerous or early stages   - AAA Screening: Not applicable  -Hearing Test: Not applicable  -Spirometry: Not applicable   PATIENT COUNSELING:    Sexuality: Discussed sexually transmitted diseases, partner selection, use of condoms, avoidance of unintended pregnancy  and contraceptive alternatives.   Advised to avoid cigarette  smoking.  I discussed with the patient that most people either abstain from alcohol or drink within safe limits (<=14/week and <=4 drinks/occasion for males, <=7/weeks and <= 3 drinks/occasion for females) and that the risk for alcohol disorders and other health effects rises proportionally with the number of drinks  per week and how often a drinker exceeds daily limits.  Discussed cessation/primary prevention of drug use and availability of treatment for abuse.   Diet: Encouraged to adjust caloric intake to maintain  or achieve ideal body weight, to reduce intake of dietary saturated fat and total fat, to limit sodium intake by avoiding high sodium foods and not adding table salt, and to maintain adequate dietary potassium and calcium preferably from fresh fruits, vegetables, and low-fat dairy products.    stressed the importance of regular exercise  Injury prevention: Discussed safety belts, safety helmets, smoke detector, smoking near bedding or upholstery.   Dental health: Discussed importance of regular tooth brushing, flossing, and dental visits.   Follow up plan: NEXT PREVENTATIVE PHYSICAL DUE IN 1 YEAR. No follow-ups on file.

## 2022-08-24 NOTE — Assessment & Plan Note (Signed)
Chronic.  Controlled.  Continue with current medication regimen of Atorvastatin.  Refills sent today.  Labs ordered today.  Return to clinic in 6 months for reevaluation.  Call sooner if concerns arise.   

## 2022-08-24 NOTE — Assessment & Plan Note (Signed)
Recommended eating smaller high protein, low fat meals more frequently and exercising 30 mins a day 5 times a week with a goal of 10-15lb weight loss in the next 3 months.  

## 2022-08-25 LAB — COMPREHENSIVE METABOLIC PANEL
ALT: 37 IU/L (ref 0–44)
AST: 23 IU/L (ref 0–40)
Albumin/Globulin Ratio: 1.8
Albumin: 4.4 g/dL (ref 4.1–5.1)
Alkaline Phosphatase: 65 IU/L (ref 44–121)
BUN/Creatinine Ratio: 14 (ref 9–20)
BUN: 10 mg/dL (ref 6–20)
Bilirubin Total: 0.3 mg/dL (ref 0.0–1.2)
CO2: 22 mmol/L (ref 20–29)
Calcium: 9.3 mg/dL (ref 8.7–10.2)
Chloride: 103 mmol/L (ref 96–106)
Creatinine, Ser: 0.72 mg/dL — ABNORMAL LOW (ref 0.76–1.27)
Globulin, Total: 2.4 g/dL (ref 1.5–4.5)
Glucose: 91 mg/dL (ref 70–99)
Potassium: 4.6 mmol/L (ref 3.5–5.2)
Sodium: 137 mmol/L (ref 134–144)
Total Protein: 6.8 g/dL (ref 6.0–8.5)
eGFR: 121 mL/min/{1.73_m2} (ref >59)

## 2022-08-25 LAB — CBC WITH DIFFERENTIAL/PLATELET
Basophils Absolute: 0.1 10*3/uL (ref 0.0–0.2)
Basos: 1 %
EOS (ABSOLUTE): 0.3 10*3/uL (ref 0.0–0.4)
Eos: 4 %
Hematocrit: 46.6 % (ref 37.5–51.0)
Hemoglobin: 15.8 g/dL (ref 13.0–17.7)
Immature Grans (Abs): 0 10*3/uL (ref 0.0–0.1)
Immature Granulocytes: 1 %
Lymphocytes Absolute: 1.8 10*3/uL (ref 0.7–3.1)
Lymphs: 23 %
MCH: 30.3 pg (ref 26.6–33.0)
MCHC: 33.9 g/dL (ref 31.5–35.7)
MCV: 89 fL (ref 79–97)
Monocytes Absolute: 0.6 10*3/uL (ref 0.1–0.9)
Monocytes: 7 %
Neutrophils Absolute: 5.1 10*3/uL (ref 1.4–7.0)
Neutrophils: 64 %
Platelets: 276 10*3/uL (ref 150–450)
RBC: 5.21 x10E6/uL (ref 4.14–5.80)
RDW: 12.6 % (ref 11.6–15.4)
WBC: 8 10*3/uL (ref 3.4–10.8)

## 2022-08-25 LAB — HEPATITIS C ANTIBODY: Hep C Virus Ab: NONREACTIVE

## 2022-08-25 LAB — TSH: TSH: 1.11 u[IU]/mL (ref 0.450–4.500)

## 2022-08-25 LAB — LIPID PANEL
Chol/HDL Ratio: 4.3 ratio (ref 0.0–5.0)
Cholesterol, Total: 172 mg/dL (ref 100–199)
HDL: 40 mg/dL (ref >39)
LDL Chol Calc (NIH): 108 mg/dL — ABNORMAL HIGH (ref 0–99)
Triglycerides: 133 mg/dL (ref 0–149)
VLDL Cholesterol Cal: 24 mg/dL (ref 5–40)

## 2022-08-25 LAB — HIV ANTIBODY (ROUTINE TESTING W REFLEX): HIV Screen 4th Generation wRfx: NONREACTIVE

## 2022-08-25 NOTE — Progress Notes (Signed)
Please let patient know that her lab work looks good.  No concerns at this time.  Continue with current medication regimen.  Follow up as discussed.

## 2022-08-26 ENCOUNTER — Other Ambulatory Visit: Payer: Self-pay

## 2022-08-26 ENCOUNTER — Telehealth: Payer: Self-pay

## 2022-08-26 ENCOUNTER — Encounter: Payer: Self-pay | Admitting: Emergency Medicine

## 2022-08-26 ENCOUNTER — Emergency Department
Admission: EM | Admit: 2022-08-26 | Discharge: 2022-08-26 | Disposition: A | Discharge status: Home / Self Care | Payer: Managed Care, Other (non HMO) | Attending: Emergency Medicine | Admitting: Emergency Medicine

## 2022-08-26 ENCOUNTER — Other Ambulatory Visit: Payer: Self-pay | Admitting: Nurse Practitioner

## 2022-08-26 DIAGNOSIS — J029 Acute pharyngitis, unspecified: Secondary | ICD-10-CM | POA: Insufficient documentation

## 2022-08-26 DIAGNOSIS — R519 Headache, unspecified: Secondary | ICD-10-CM | POA: Diagnosis not present

## 2022-08-26 DIAGNOSIS — R509 Fever, unspecified: Secondary | ICD-10-CM | POA: Insufficient documentation

## 2022-08-26 DIAGNOSIS — Z1152 Encounter for screening for COVID-19: Secondary | ICD-10-CM | POA: Insufficient documentation

## 2022-08-26 LAB — GROUP A STREP BY PCR: Group A Strep by PCR: NOT DETECTED

## 2022-08-26 LAB — SARS CORONAVIRUS 2 BY RT PCR: SARS Coronavirus 2 by RT PCR: NEGATIVE

## 2022-08-26 MED ORDER — ACETAMINOPHEN-CODEINE 300-30 MG PO TABS: 1.0000 | ORAL_TABLET | ORAL | 0 refills | Status: DC | PRN

## 2022-08-26 NOTE — ED Provider Notes (Signed)
[facility] Provider Note    Event Date/Time   First MD Initiated Contact with Patient 08/26/22 [phone]     (approximate)   History   Sore Throat   HPI  Thomas Hopkins is a 37 y.o. male  with history of DVT, GERD, and as listed in EMR presents to the emergency department for treatment and evaluation of sore throat, headache, and fever that started yesterday. He used Vicks sore throat spray, but used 3 sprays instead of just 1 and swallowed it instead of spitting it out. Bottle says to use 1 spray and not to swallow it. Fever up to 100.9 last night.       Physical Exam   Triage Vital Signs: ED Triage Vitals [08/26/22 0632]  Enc Vitals Group     BP 129/75     Pulse Rate 73     Resp 20     Temp 98.3 F (36.8 C)     Temp Source Oral     SpO2 96 %     Weight (!) 310 lb (140.6 kg)     Height 5\' 8"  (1.727 m)     Head Circumference      Peak Flow      Pain Score 7     Pain Loc      Pain Edu?      Excl. in GC?     Most recent vital signs: Vitals:   08/26/22 0632  BP: 129/75  Pulse: 73  Resp: 20  Temp: 98.3 F (36.8 C)  SpO2: 96%    General: Awake, no distress.  CV:  Good peripheral perfusion.  Resp:  Normal effort.  Abd:  No distention.  Other:  Posterior oropharynx erythematous; tonsils abscent   ED Results / Procedures / Treatments   Labs (all labs ordered are listed, but only abnormal results are displayed) Labs Reviewed  SARS CORONAVIRUS 2 BY RT PCR  GROUP A STREP BY PCR     EKG  Not indicated.   RADIOLOGY  Image and radiology report reviewed and interpreted by me. Radiology report consistent with the same.  Not indicated.  PROCEDURES:  Critical Care performed: No  Procedures   MEDICATIONS ORDERED IN ED:  Medications - No data to display   IMPRESSION / MDM / ASSESSMENT AND PLAN / ED COURSE   I have reviewed the triage note.  Differential diagnosis includes, but is not limited to, viral  pharyngitis, strep throat, Covid   Patient's presentation is most consistent with acute complicated illness / injury requiring diagnostic workup.  37 year old male presenting with sore throat for about 24 hours. Throat is erythematous but without exudate.  Covid and strep screening is negative. He will be treated symptomatically and advised to follow up with primary care if not improving over the next few days. If symptoms change or worsen and he is unable to see PCP, he is to return to the ER.     FINAL CLINICAL IMPRESSION(S) / ED DIAGNOSES   Final diagnoses:  Acute pharyngitis, unspecified etiology     Rx / DC Orders   ED Discharge Orders          Ordered    acetaminophen-codeine (TYLENOL #3) 300-30 MG tablet  Every 4 hours PRN        08/26/22 0752             Note:  This document was prepared using Dragon voice recognition software and may include unintentional dictation  errors.   Chinita Pester, FNP 08/26/22 1610    Jene Every, MD 08/26/22 0800

## 2022-08-26 NOTE — Discharge Instructions (Signed)
Follow-up with your primary care provider if you are not improving over the next 2 to 3 days.  Return to the emergency department if symptoms change or worsen if you are unable to schedule appointment with primary care.

## 2022-08-26 NOTE — ED Triage Notes (Signed)
Patient ambulatory to triage with steady gait, without difficulty or distress noted; pt reports since yesterday having sore throat, fever, HA and congestion

## 2022-08-26 NOTE — ED Notes (Signed)
See triage note  Presents with sore throat congestion and subjective fever  Currently afebrile on arrival  Able to swallow w/o diff

## 2022-08-26 NOTE — Telephone Encounter (Signed)
Requested medication (s) are due for refill today - expired Rx  Requested medication (s) are on the active medication list -yes  Future visit scheduled -yes  Last refill: 05/07/21 #90 3RF  Notes to clinic: outside provider, expired Rx  Requested Prescriptions  Pending Prescriptions Disp Refills   clopidogrel (PLAVIX) 75 MG tablet [Pharmacy Med Name: CLOPIDOGREL 75 MG TABLET] 90 tablet 3    Sig: TAKE 1 TABLET BY MOUTH EVERY DAY     Hematology: Antiplatelets - clopidogrel Failed - 08/26/2022  9:03 AM      Failed - Cr in normal range and within 360 days    Creatinine, Ser  Date Value Ref Range Status  08/24/2022 0.72 (L) 0.76 - 1.27 mg/dL Final         Passed - HCT in normal range and within 180 days    Hematocrit  Date Value Ref Range Status  08/24/2022 46.6 37.5 - 51.0 % Final         Passed - HGB in normal range and within 180 days    Hemoglobin  Date Value Ref Range Status  08/24/2022 15.8 13.0 - 17.7 g/dL Final         Passed - PLT in normal range and within 180 days    Platelets  Date Value Ref Range Status  08/24/2022 276 150 - 450 x10E3/uL Final         Passed - Valid encounter within last 6 months    Recent Outpatient Visits           2 days ago Annual physical exam   [place] [facility] Larae Grooms, NP   3 months ago Morbid obesity [facility])   [place] [facility] Larae Grooms, NP       Future Appointments             In 6 months Larae Grooms, NP [place] [facility]               Requested Prescriptions  Pending Prescriptions Disp Refills   clopidogrel (PLAVIX) 75 MG tablet [Pharmacy Med Name: CLOPIDOGREL 75 MG TABLET] 90 tablet 3    Sig: TAKE 1 TABLET BY MOUTH EVERY DAY     Hematology: Antiplatelets - clopidogrel Failed - 08/26/2022  9:03 AM      Failed - Cr in normal range and within 360 days    Creatinine, Ser  Date Value Ref Range Status  08/24/2022 0.72 (L) 0.76 -  1.27 mg/dL Final         Passed - HCT in normal range and within 180 days    Hematocrit  Date Value Ref Range Status  08/24/2022 46.6 37.5 - 51.0 % Final         Passed - HGB in normal range and within 180 days    Hemoglobin  Date Value Ref Range Status  08/24/2022 15.8 13.0 - 17.7 g/dL Final         Passed - PLT in normal range and within 180 days    Platelets  Date Value Ref Range Status  08/24/2022 276 150 - 450 x10E3/uL Final         Passed - Valid encounter within last 6 months    Recent Outpatient Visits           2 days ago Annual physical exam   [place] [facility] Larae Grooms, NP   3 months ago Morbid obesity [facility])   [place] [facility] Larae Grooms, NP       Future Appointments             In 6 months Larae Grooms, NP [place] [facility]

## 2022-08-26 NOTE — Transitions of Care (Post Inpatient/ED Visit) (Signed)
   08/26/2022  Name: Thomas Hopkins MRN: [id number] DOB: 03/24/1985  Today's TOC FU Call Status: Today's TOC FU Call Status:: Successful TOC FU Call Competed TOC FU Call Complete Date: 08/26/22  Transition Care Management Follow-up Telephone Call Date of Discharge: 08/26/22 Discharge Facility: [facility]) Type of Discharge: Emergency Department Reason for ED Visit: Respiratory How have you been since you were released from the hospital?: Same Any questions or concerns?: No  Items Reviewed: Did you receive and understand the discharge instructions provided?: Yes Medications obtained,verified, and reconciled?: Yes (Medications Reviewed) Any new allergies since your discharge?: No Dietary orders reviewed?: NA Do you have support at home?: No  Medications Reviewed Today: Medications Reviewed Today     Reviewed by Pablo Ledger, CMA (Certified Medical Assistant) on 08/26/22 at 1455  Med List Status: <None>   Medication Order Taking? Sig Documenting Provider Last Dose Status Informant  acetaminophen-codeine (TYLENOL #3) 300-30 MG tablet [id number] No Take 1 tablet by mouth every 4 (four) hours as needed for moderate pain.  Patient not taking: Reported on 08/26/2022   Chinita Pester, FNP Not Taking Active   aspirin 325 MG tablet [id number] Yes Take 325 mg by mouth daily. [provider] Taking Active   atorvastatin (LIPITOR) 40 MG tablet [id number] Yes TAKE 1 TABLET BY MOUTH EVERY DAY AFTER Demaris Callander, NP Taking Active   clopidogrel (PLAVIX) 75 MG tablet [id number] Yes TAKE 1 TABLET BY MOUTH EVERY DAY Schnier, Latina Craver, MD Taking Active   omeprazole (PRILOSEC OTC) 20 MG tablet [id number]  Take 1 tablet (20 mg total) by mouth daily. Larae Grooms, NP  Expired 06/23/22 2359             Home Care and Equipment/Supplies: Were Home Health Services Ordered?: NA Any new equipment or medical supplies ordered?: NA  Functional  Questionnaire: Do you need assistance with bathing/showering or dressing?: No Do you need assistance with meal preparation?: No Do you need assistance with eating?: No Do you have difficulty maintaining continence: No Do you need assistance with getting out of bed/getting out of a chair/moving?: No Do you have difficulty managing or taking your medications?: No  Follow up appointments reviewed: PCP Follow-up appointment confirmed?: No MD Provider Line Number:[phone] Given: No Specialist Hospital Follow-up appointment confirmed?: No Do you need transportation to your follow-up appointment?: No Do you understand care options if your condition(s) worsen?: Yes-patient verbalized understanding    SIGNATURE: Wilhemena Durie, CMA

## 2022-09-13 ENCOUNTER — Telehealth (INDEPENDENT_AMBULATORY_CARE_PROVIDER_SITE_OTHER): Payer: Self-pay | Admitting: Nurse Practitioner

## 2022-09-13 ENCOUNTER — Other Ambulatory Visit (INDEPENDENT_AMBULATORY_CARE_PROVIDER_SITE_OTHER): Payer: Self-pay | Admitting: Vascular Surgery

## 2022-09-13 NOTE — Telephone Encounter (Signed)
Patient called in requesting refill on medication   clopidogrel (PLAVIX) 75 MG tablet    [id number] - [place], [place] - [address] [facility]

## 2022-09-13 NOTE — Telephone Encounter (Signed)
I sent it in this morning around 9 am.

## 2022-09-20 ENCOUNTER — Ambulatory Visit (INDEPENDENT_AMBULATORY_CARE_PROVIDER_SITE_OTHER): Payer: Managed Care, Other (non HMO) | Admitting: Orthopaedic Surgery

## 2022-09-20 ENCOUNTER — Other Ambulatory Visit (INDEPENDENT_AMBULATORY_CARE_PROVIDER_SITE_OTHER): Payer: Managed Care, Other (non HMO)

## 2022-09-20 VITALS — BP 132/98 | HR 82

## 2022-09-20 DIAGNOSIS — M546 Pain in thoracic spine: Secondary | ICD-10-CM

## 2022-09-20 DIAGNOSIS — M542 Cervicalgia: Secondary | ICD-10-CM

## 2022-09-20 MED ORDER — PREDNISONE 10 MG (21) PO TBPK: ORAL_TABLET | ORAL | 0 refills | Status: DC

## 2022-09-20 NOTE — Progress Notes (Signed)
Office Visit Note   Patient: Thomas Hopkins           Date of Birth: 10-02-1985           MRN: [id number] Visit Date: 09/20/2022              Requested by: Larae Grooms, NP [address] [place],  [place] [address] PCP: Larae Grooms, NP   Assessment & Plan: Visit Diagnoses:  1. Neck pain   2. Pain in thoracic spine   3.     Low back pain  Plan: Will set patient up for some physical therapy [facility] since it is convenient for him where he lives.  I will recheck him again in 2 months and examined ongoing symptoms we will consider imaging studies.  Currently he thinks his lumbar spine is the area with 1 which is the most painful.  Follow-Up Instructions: Return in about 2 months (around 11/21/2022).   Orders:  Orders Placed This Encounter  Procedures   XR Cervical Spine 2 or 3 views   Ambulatory referral to Physical Therapy   Meds ordered this encounter  Medications   predniSONE (STERAPRED UNI-PAK 21 TAB) 10 MG (21) TBPK tablet    Sig: Take 6,5,4,3,2,1 one tablet less each day with food.    Dispense:  21 tablet    Refill:  0      Procedures: No procedures performed   Clinical Data: No additional findings.   Subjective: Chief Complaint  Patient presents with   Middle Back - Pain   Lower Back - Pain   Neck - Pain    HPI 37 year old male lives with his parents with complaints of neck pain thoracic pain and lumbar pain.  Lumbar pain may be the worst.  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  History of previous appendectomy later had DVT in the leg.  Arterial occlusion requiring stent procedure.  He states he has numbness and tingling in his legs.  Patient had arterial embolism and thrombosis of the right lower extremity and also history of venous embolism and thrombosis of the lower extremity.  Denies referred  numbness tingling the upper extremities.  He does have problems with morbid obesity, tobacco use disorder hyperlipidemia.  No associated bowel or bladder symptoms no fever or chills has not been through any therapy.  Review of Systems all other systems are noncontributory to HPI.   Objective: Vital Signs: BP (!) 132/98   Pulse 82   Physical Exam Constitutional:      Appearance: He is well-developed.  HENT:     Head: Normocephalic and atraumatic.     Right Ear: External ear normal.     Left Ear: External ear normal.  Eyes:     Pupils: Pupils are equal, round, and reactive to light.  Neck:     Thyroid: No thyromegaly.     Trachea: No tracheal deviation.  Cardiovascular:     Rate and Rhythm: Normal rate.  Pulmonary:     Effort: Pulmonary effort is normal.     Breath sounds: No wheezing.  Abdominal:     General: Bowel sounds are normal.     Palpations: Abdomen is soft.  Musculoskeletal:     Cervical back: Neck supple.  Skin:    General: Skin is warm and dry.     Capillary Refill: Capillary refill  takes less than 2 seconds.  Neurological:     Mental Status: He is alert and oriented to person, place, and time.  Psychiatric:        Behavior: Behavior normal.        Thought Content: Thought content normal.        Judgment: Judgment normal.     Ortho Exam good range of motion of his neck with discomfort with rotation of the right and tilting negative Spurling.  Reflexes are 2+ and symmetrical he is able to heel and toe walk without deficit.  Specialty Comments:  No specialty comments available.  Imaging: XR Cervical Spine 2 or 3 views  Result Date: 09/20/2022 AP lateral cervical spine demonstrate straightening of the cervical spine.  Trace narrowing at C5-6 with uncovertebral prominence on AP imaging.  No anterolisthesis. Impression: Nonspecific straightening of cervical spine with mild uncovertebral changes.    PMFS History: Patient Active Problem List   Diagnosis Date  Noted   Pain in thoracic spine 09/20/2022   Morbid obesity (HCC) 06/17/2019   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) 03/19/2017   Hyperlipidemia 03/19/2017   Atherosclerosis of aorta (HCC) 01/27/2017   Ischemic foot pain at rest 11/18/2016   Tobacco use disorder 05/04/2016   Acute venous embolism and thrombosis of deep vessels of distal lower extremity (HCC) 10/22/2014   Deep venous thrombosis (HCC) 10/22/2014   RLQ abdominal pain    Abdominal pain, acute, right lower quadrant 10/13/2014   Past Medical History:  Diagnosis Date   DVT of lower extremity, bilateral (HCC)    stopped Eliquis in 2017   GERD (gastroesophageal reflux disease)    Hyperlipidemia     Family History  Problem Relation Age of Onset   Diabetes Mother    Rectal cancer Father    Scoliosis Brother    Diabetes Mellitus II Maternal Grandmother    Heart disease Maternal Grandfather     Past Surgical History:  Procedure Laterality Date   ABDOMINAL AORTOGRAM W/LOWER EXTREMITY Right 11/18/2016   Procedure: ABDOMINAL AORTOGRAM W/LOWER EXTREMITY;  Surgeon: Renford Dills, MD;  Location: [facility];  Service: Cardiovascular;  Laterality: Right;   APPENDECTOMY     LAPAROSCOPIC APPENDECTOMY N/A 10/14/2014   Procedure: APPENDECTOMY LAPAROSCOPIC;  Surgeon: Lattie Haw, MD;  Location: [facility];  Service: General;  Laterality: N/A;   TONSILLECTOMY     WISDOM TOOTH EXTRACTION     Social History   Occupational History   Not on file  Tobacco Use   Smoking status: Some Days    Packs/day: 0.25    Years: 15.00    Additional pack years: 0.00    Total pack years: 3.75    Types: Cigarettes   Smokeless tobacco: Never  Vaping Use   Vaping Use: Never used  Substance and Sexual Activity   Alcohol use: Yes    Comment: very occasionally   Drug use: No   Sexual activity: Not Currently

## 2022-10-04 ENCOUNTER — Ambulatory Visit: Payer: Managed Care, Other (non HMO)

## 2022-10-04 ENCOUNTER — Ambulatory Visit (INDEPENDENT_AMBULATORY_CARE_PROVIDER_SITE_OTHER): Payer: 59 | Admitting: Gastroenterology

## 2022-10-04 ENCOUNTER — Other Ambulatory Visit: Payer: Self-pay

## 2022-10-04 ENCOUNTER — Encounter: Payer: Self-pay | Admitting: Gastroenterology

## 2022-10-04 VITALS — BP 105/56 | HR 47 | Temp 97.7°F | Ht 68.0 in | Wt 296.4 lb

## 2022-10-04 DIAGNOSIS — G8929 Other chronic pain: Secondary | ICD-10-CM

## 2022-10-04 DIAGNOSIS — K219 Gastro-esophageal reflux disease without esophagitis: Secondary | ICD-10-CM | POA: Diagnosis not present

## 2022-10-04 DIAGNOSIS — R1013 Epigastric pain: Secondary | ICD-10-CM

## 2022-10-04 MED ORDER — OMEPRAZOLE 40 MG PO CPDR: 40.0000 mg | DELAYED_RELEASE_CAPSULE | Freq: Every day | ORAL | 3 refills | Status: DC

## 2022-10-04 NOTE — Progress Notes (Signed)
Arlyss Repress, MD [address]  [address]  [place], [place] [address]  Main: [phone]  Fax: [phone]    Gastroenterology Consultation  Referring Provider:     Larae Grooms, NP Primary Care Physician:  Larae Grooms, NP Primary Gastroenterologist:  Dr. Arlyss Repress Reason for Consultation: Chronic GERD        HPI:   Thomas Hopkins is a 37 y.o. male referred by Larae Grooms, NP  for consultation & management of chronic GERD.  Patient reports symptoms of heartburn, regurgitation worse at night with intermittent flareups for last 1 year.  He is on omeprazole 20 mg daily which provides modest relief only.  He did not try higher dose yet.  He has history of recurrent DVTs s/p thrombectomy, on long-term Plavix and high-dose aspirin managed by vascular surgery.  Patient denies any difficulty swallowing.  He does report epigastric pain as well.  NSAIDs: None  Antiplts/Anticoagulants/Anti thrombotics: Aspirin 325 mg, Plavix 75 mg daily  GI Procedures: None  Past Medical History:  Diagnosis Date   DVT of lower extremity, bilateral (HCC)    stopped Eliquis in 2017   GERD (gastroesophageal reflux disease)    Hyperlipidemia     Past Surgical History:  Procedure Laterality Date   ABDOMINAL AORTOGRAM W/LOWER EXTREMITY Right 11/18/2016   Procedure: ABDOMINAL AORTOGRAM W/LOWER EXTREMITY;  Surgeon: Renford Dills, MD;  Location: [facility];  Service: Cardiovascular;  Laterality: Right;   APPENDECTOMY     LAPAROSCOPIC APPENDECTOMY N/A 10/14/2014   Procedure: APPENDECTOMY LAPAROSCOPIC;  Surgeon: Lattie Haw, MD;  Location: [facility];  Service: General;  Laterality: N/A;   TONSILLECTOMY     WISDOM TOOTH EXTRACTION       Current Outpatient Medications:    aspirin 325 MG tablet, Take 325 mg by mouth daily., Disp: , Rfl:    atorvastatin (LIPITOR) 40 MG tablet, TAKE 1 TABLET BY MOUTH EVERY DAY AFTER 6PM, Disp: 90 tablet, Rfl: 1    clopidogrel (PLAVIX) 75 MG tablet, TAKE 1 TABLET BY MOUTH EVERY DAY, Disp: 90 tablet, Rfl: 3   omeprazole (PRILOSEC OTC) 20 MG tablet, Take 1 tablet (20 mg total) by mouth daily., Disp: 30 tablet, Rfl: 0   acetaminophen-codeine (TYLENOL #3) 300-30 MG tablet, Take 1 tablet by mouth every 4 (four) hours as needed for moderate pain. (Patient not taking: Reported on 08/26/2022), Disp: 12 tablet, Rfl: 0   Family History  Problem Relation Age of Onset   Diabetes Mother    Rectal cancer Father    Scoliosis Brother    Diabetes Mellitus II Maternal Grandmother    Heart disease Maternal Grandfather      Social History   Tobacco Use   Smoking status: Every Day    Current packs/day: 0.25    Average packs/day: 0.3 packs/day for 15.0 years (3.8 ttl pk-yrs)    Types: Cigarettes   Smokeless tobacco: Never  Vaping Use   Vaping status: Never Used  Substance Use Topics   Alcohol use: Yes    Comment: very occasionally   Drug use: No    Allergies as of 10/04/2022 - Review Complete 10/04/2022  Allergen Reaction Noted   Bee venom Hives 05/04/2016   Morphine Nausea Only 05/15/2022   Vicodin [hydrocodone-acetaminophen] Nausea And Vomiting 10/22/2014   Hydrocodone-acetaminophen Nausea And Vomiting 10/22/2014   Mushroom extract complex Nausea And Vomiting 05/04/2016   Olive oil Nausea And Vomiting 05/04/2016    Review of Systems:    All  systems reviewed and negative except where noted in HPI.   Physical Exam:  BP (!) 105/56 (BP Location: Right Arm, Patient Position: Sitting, Cuff Size: Large)   Pulse (!) 47   Temp 97.7 F (36.5 C) (Oral)   Ht 5\' 8"  (1.727 m)   Wt 296 lb 6 oz (134.4 kg)   BMI 45.06 kg/m  No LMP for male patient.  General:   Alert,  Well-developed, well-nourished, pleasant and cooperative in NAD Head:  Normocephalic and atraumatic. Eyes:  Sclera clear, no icterus.   Conjunctiva pink. Ears:  Normal auditory acuity. Nose:  No deformity, discharge, or lesions. Mouth:  No  deformity or lesions,oropharynx pink & moist. Neck:  Supple; no masses or thyromegaly. Lungs:  Respirations even and unlabored.  Clear throughout to auscultation.   No wheezes, crackles, or rhonchi. No acute distress. Heart:  Regular rate and rhythm; no murmurs, clicks, rubs, or gallops. Abdomen:  Normal bowel sounds. Soft, non-tender and non-distended without masses, hepatosplenomegaly or hernias noted.  No guarding or rebound tenderness.   Rectal: Not performed Msk:  Symmetrical without gross deformities. Good, equal movement & strength bilaterally. Pulses:  Normal pulses noted. Extremities:  No clubbing or edema.  No cyanosis. Neurologic:  Alert and oriented x3;  grossly normal neurologically. Skin:  Intact without significant lesions or rashes. No jaundice. Psych:  Alert and cooperative. Normal mood and affect.  Imaging Studies: Reviewed  Assessment and Plan:   Thomas Hopkins is a 37 y.o. male with morbid obesity, fatty liver, recurrent DVTs s/p thrombectomy and DAPT is seen in consultation for chronic GERD  Chronic GERD and epigastric pain Increase omeprazole to 40 mg before breakfast and before dinner Discussed about antireflux lifestyle Discussed with patient to talk to vascular surgery about decreasing the dose of aspirin from 325 mg to 81 mg instead Discussed about EGD for further evaluation and also screening for Barrett's esophagus   Follow up based on the EGD findings   Arlyss Repress, MD

## 2022-10-13 ENCOUNTER — Telehealth: Payer: Self-pay

## 2022-10-13 NOTE — Telephone Encounter (Addendum)
Per Dr. Gilda Crease Patient needs to stop the Plavix 75mg  5 days before EGD and Aspirin 325mg  7 days before procedure  and restart it 2 days after procedure. For EGD on 11/10/2022

## 2022-10-13 NOTE — Telephone Encounter (Signed)
Called patient and patient verbalized understanding  ?

## 2022-10-14 NOTE — Therapy (Addendum)
OUTPATIENT PHYSICAL THERAPY EVALUATION  DISCHARGE   Patient Name: Thomas Hopkins MRN: [id number] DOB:July 10, 1985, 37 y.o., male Today's Date: 10/17/2022   END OF SESSION:  PT End of Session - 10/17/22 0753     Visit Number 1    Number of Visits 9    Date for PT Re-Evaluation 12/12/22    Authorization Type [employer] / [employer] MCD    PT Start Time 0800    PT Stop Time 0845    PT Time Calculation (min) 45 min    Activity Tolerance Patient tolerated treatment well    Behavior During Therapy [facility] for tasks assessed/performed             Past Medical History:  Diagnosis Date   DVT of lower extremity, bilateral (HCC)    stopped Eliquis in 2017   GERD (gastroesophageal reflux disease)    Hyperlipidemia    Past Surgical History:  Procedure Laterality Date   ABDOMINAL AORTOGRAM W/LOWER EXTREMITY Right 11/18/2016   Procedure: ABDOMINAL AORTOGRAM W/LOWER EXTREMITY;  Surgeon: Renford Dills, MD;  Location: [facility];  Service: Cardiovascular;  Laterality: Right;   APPENDECTOMY     LAPAROSCOPIC APPENDECTOMY N/A 10/14/2014   Procedure: APPENDECTOMY LAPAROSCOPIC;  Surgeon: Lattie Haw, MD;  Location: [facility];  Service: General;  Laterality: N/A;   TONSILLECTOMY     WISDOM TOOTH EXTRACTION     Patient Active Problem List   Diagnosis Date Noted   Pain in thoracic spine 09/20/2022   Morbid obesity (HCC) 06/17/2019   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) 03/19/2017   Hyperlipidemia 03/19/2017   Atherosclerosis of aorta (HCC) 01/27/2017   Ischemic foot pain at rest 11/18/2016   Tobacco use disorder 05/04/2016   Acute venous embolism and thrombosis of deep vessels of distal lower extremity (HCC) 10/22/2014   Deep venous thrombosis (HCC) 10/22/2014   RLQ abdominal pain    Abdominal pain, acute, right lower quadrant 10/13/2014    PCP: Larae Grooms, NP  REFERRING PROVIDER: Eldred Manges, MD  REFERRING DIAG: Neck  pain  THERAPY DIAG:  Other low back pain  Cervicalgia  Muscle weakness (generalized)  Abnormal posture  Rationale for Evaluation and Treatment: Rehabilitation  ONSET DATE: Low back pain chronic for years, neck and shoulder pain ongoing for a few weeks   SUBJECTIVE:  SUBJECTIVE STATEMENT: Patient reports a lot of back pain mainly in the center, for years, and then neck and right shoulder pain with working that started a few weeks back. Does a lot of physical labor for work. His low back pain is constant but will flare up with work activities. The neck pain is mainly located to the right neck and top of shoulder region. His pain gets worse with lifting, bending, twisting movement, and reports the neck will throb when driving after work. Denies any numbness of tingling in the extremities.   PERTINENT HISTORY:  See PMH above  PAIN:  Are you having pain? Yes:  NPRS scale: 4/10 (constantly 4-5/10 pain) Pain location: Low back Pain description: "stepping on back or a knife" Aggravating factors: Weed eating, lifting, bending Relieving factors: Rest, switching positions  NPRS scale: 2/10 Pain location: Neck and right shoulder Pain description: "throbbing" Aggravating factors: Lifting, driving Relieving factors: Rest, switching positions  PRECAUTIONS: None  RED FLAGS: None    WEIGHT BEARING RESTRICTIONS: No  FALLS:  Has patient fallen in last 6 months? No  OCCUPATION: Physical labor job  PLOF: Independent  PATIENT GOALS: Pain relief with work   OBJECTIVE:  DIAGNOSTIC FINDINGS:  Negative  PATIENT SURVEYS:  FOTO Neck 59%, Lumbar: 56%  COGNITION: Overall cognitive status: Within functional limits for tasks assessed  SENSATION: WFL  POSTURE:   Rounded shoulder and  forward head posture  PALPATION: Exquisite tenderness right upper trap, mild tenderness infraspinatus and rhomboid region; generalized tenderness of lumbar paraspinals  Lumbar CPAs WFL but patient reports pain at all levels   CERVICAL ROM:   Active ROM A/PROM (deg) eval  Flexion 60  Extension 50  Right lateral flexion 40  Left lateral flexion 35  Right rotation 60  Left rotation 70   (Blank rows = not tested)  LUMBAR ROM:   Active  A/PROM  eval  Flexion WFL - hamstring tightness noted  Extension 50%  Right lateral flexion WFL  Left lateral flexion WFL  Right rotation 75%  Left rotation 75%   (Blank rows = not tested)  UPPER/LOWER EXTREMITY ROM: WFL, he does report right shoulder pain with reaching behind his back on the right side  UPPER EXTREMITY MMT:  MMT Right eval Left eval  Shoulder flexion 4+ 5  Shoulder extension    Shoulder abduction 4+ 5  Shoulder adduction    Shoulder extension    Shoulder internal rotation 5 5  Shoulder external rotation 4+ 5  Middle trapezius 4- 4-  Lower trapezius    Elbow flexion 5 5  Elbow extension 5 5  Wrist flexion    Wrist extension    Wrist ulnar deviation    Wrist radial deviation    Wrist pronation    Wrist supination    Grip strength     (Blank rows = not tested)  LOWER EXTREMITY MMT:    MMT Right eval Left eval  Hip flexion 4- 4-  Hip extension 3+ 3+  Hip abduction 3+ 3+  Hip adduction    Hip internal rotation    Hip external rotation    Knee flexion 5 5  Knee extension 5 5  Ankle dorsiflexion    Ankle plantarflexion    Ankle inversion    Ankle eversion     (Blank rows = not tested)  SPECIAL TESTS:  Lumbar and cervical radiculopathy testing negative  FUNCTIONAL TESTS:  Lifting mechanics: patient demonstrates increased lumbar flexion and generally rounded spine with any lifting from  the floor   TODAY'S TREATMENT:      [facility] Adult PT Treatment:                                                 DATE: 10/17/2022 Therapeutic Exercise: LTR x 5 each Cat cow x 10 Side clamshell with red x 10 each Trial bridge but patient unable to perform Prone hip extension with pillow under abdomen x 10 each Seated shoulder horizontal abduction with red x 10 Seated upper trap stretch 2 x 15 sec each  PATIENT EDUCATION:  Education details: Exam findings, POC, HEP Person educated: Patient Education method: Explanation, Demonstration, Tactile cues, Verbal cues, and Handouts Education comprehension: verbalized understanding, returned demonstration, verbal cues required, tactile cues required, and needs further education  HOME EXERCISE PROGRAM: Access Code: [email or web address]    ASSESSMENT: CLINICAL IMPRESSION: Patient is a 37 y.o. male who was seen today for physical therapy evaluation and treatment for chronic lower back and neck/right shoulder pain. He exhibits nonspecific low back pain without any radicular symptoms. He does exhibit limitations in lumbar motion with pain reported mainly into extension and rotations, pain with spinal accessory movements limitations with core control and hip strength. His right side neck/shoulder pain seems to be muscular in nature without any radicular symptoms. He reports significant tenderness of the right upper trap region with slight limitations in cervical motion from muscular tightness. He does exhibit right rotator cuff and periscapular weakness. He was provided HEP to address impairments and would benefit from continued skilled PT to progress his mobility and strength in order to reduce pain and maximize functional ability.   OBJECTIVE IMPAIRMENTS: decreased activity tolerance, decreased ROM, decreased strength, impaired flexibility, improper body mechanics, postural dysfunction, and pain.   ACTIVITY LIMITATIONS: carrying, lifting, bending, sitting, standing, squatting, hygiene/grooming, and locomotion level  PARTICIPATION LIMITATIONS: meal prep, cleaning, driving,  community activity, and occupation  PERSONAL FACTORS: Fitness, Past/current experiences, and Time since onset of injury/illness/exacerbation are also affecting patient's functional outcome.   REHAB POTENTIAL: Good  CLINICAL DECISION MAKING: Stable/uncomplicated  EVALUATION COMPLEXITY: Low   GOALS: Goals reviewed with patient? Yes  SHORT TERM GOALS: Target date: 11/14/2022  Patient will be I with initial HEP in order to progress with therapy. Baseline: HEP provided at eval Goal status: INITIAL  2.  Patient will demonstrate right cervical rotation >/= 70 deg in order to indicate reduced muscle tension and improve driving ability Baseline: 60 deg Goal status: INITIAL  3.  Patient will demonstrate proper lifting mechanics using hip hinge technique and ability to maintain lumbar control to reduce pain with lifting Baseline:  Goal status: INITIAL  LONG TERM GOALS: Target date: 12/12/2022  Patient will be I with final HEP to maintain progress from PT. Baseline: HEP provided at eval Goal status: INITIAL  2.  Patient will report >/= Neck 70%, Lumbar 68% status on FOTO to indicate improved functional ability. Baseline: Neck 59%, Lumbar: 56% Goal status: INITIAL  3.  Patient will demonstrate right rotator cuff and periscapular strength 5/5 MMT in order to improve postural control and lifting ability Baseline: see limitations above Goal status: INITIAL  4.  Patient will demonstrate gross core and hip strength >/= 4/5 MMT in order to improve lifting ability and tolerance for work related tasks Baseline: see limitations above Goal status: INITIAL   PLAN: PT FREQUENCY: 1x/week  PT  DURATION: 8 weeks  PLANNED INTERVENTIONS: Therapeutic exercises, Therapeutic activity, Neuromuscular re-education, Balance training, Gait training, Patient/Family education, Self Care, Joint mobilization, Joint manipulation, Aquatic Therapy, Dry Needling, Electrical stimulation, Spinal manipulation, Spinal  mobilization, Cryotherapy, Moist heat, Taping, Traction, Manual therapy, and Re-evaluation  PLAN FOR NEXT SESSION: Review HEP and progress PRN, manual/TPDN for right upper trap region and lumbar paraspinals, lifting mechanics and progression, progress core and hip strengthening, postural control and right rotator cuff strengthening, overhead lifting   Rosana Hoes, PT, DPT, LAT, ATC 10/17/22  2:16 PM Phone: [phone] Fax: [phone]     PHYSICAL THERAPY DISCHARGE SUMMARY  Visits from Start of Care: 1  Current functional level related to goals / functional outcomes: See above   Remaining deficits: See above   Education / Equipment: HEP   Patient agrees to discharge. Patient goals were not met. Patient is being discharged due to not returning since the last visit.  Rosana Hoes, PT, DPT, LAT, ATC 01/02/23  12:38 PM Phone: [phone] Fax: [phone]

## 2022-10-17 ENCOUNTER — Ambulatory Visit: Payer: Managed Care, Other (non HMO) | Attending: Orthopaedic Surgery | Admitting: Physical Therapy

## 2022-10-17 ENCOUNTER — Encounter: Payer: Self-pay | Admitting: Physical Therapy

## 2022-10-17 ENCOUNTER — Other Ambulatory Visit: Payer: Self-pay

## 2022-10-17 DIAGNOSIS — R293 Abnormal posture: Secondary | ICD-10-CM | POA: Diagnosis present

## 2022-10-17 DIAGNOSIS — M6281 Muscle weakness (generalized): Secondary | ICD-10-CM | POA: Diagnosis present

## 2022-10-17 DIAGNOSIS — M542 Cervicalgia: Secondary | ICD-10-CM | POA: Insufficient documentation

## 2022-10-17 DIAGNOSIS — M5459 Other low back pain: Secondary | ICD-10-CM | POA: Insufficient documentation

## 2022-10-17 NOTE — Patient Instructions (Signed)
Access Code: [email or web address] URL: [email or web address] Date: 10/17/2022 Prepared by: Rosana Hoes  Exercises - Supine Lower Trunk Rotation  - 1 x daily - 10 reps - Clam with Resistance  - 1 x daily - 2 sets - 10 reps - Prone Hip Extension with Pillow Under Abdomen  - 1 x daily - 2 sets - 10 reps - Cat Cow  - 1 x daily - 10 reps - Seated Shoulder Horizontal Abduction with Resistance - Palms Down  - 1 x daily - 2 sets - 10 reps - Seated Cervical Sidebending Stretch  - 1 x daily - 3 reps - 15 seconds hold

## 2022-10-24 ENCOUNTER — Encounter: Payer: Self-pay | Admitting: Emergency Medicine

## 2022-10-24 ENCOUNTER — Emergency Department: Payer: Managed Care, Other (non HMO)

## 2022-10-24 ENCOUNTER — Ambulatory Visit: Payer: Self-pay

## 2022-10-24 ENCOUNTER — Emergency Department
Admission: EM | Admit: 2022-10-24 | Discharge: 2022-10-24 | Disposition: A | Discharge status: Home / Self Care | Payer: Managed Care, Other (non HMO) | Attending: Emergency Medicine | Admitting: Emergency Medicine

## 2022-10-24 ENCOUNTER — Other Ambulatory Visit: Payer: Self-pay

## 2022-10-24 DIAGNOSIS — Z86718 Personal history of other venous thrombosis and embolism: Secondary | ICD-10-CM | POA: Diagnosis not present

## 2022-10-24 DIAGNOSIS — Y92812 Truck as the place of occurrence of the external cause: Secondary | ICD-10-CM | POA: Insufficient documentation

## 2022-10-24 DIAGNOSIS — S93492A Sprain of other ligament of left ankle, initial encounter: Secondary | ICD-10-CM

## 2022-10-24 DIAGNOSIS — Z7901 Long term (current) use of anticoagulants: Secondary | ICD-10-CM | POA: Insufficient documentation

## 2022-10-24 DIAGNOSIS — X501XXA Overexertion from prolonged static or awkward postures, initial encounter: Secondary | ICD-10-CM | POA: Insufficient documentation

## 2022-10-24 DIAGNOSIS — M7989 Other specified soft tissue disorders: Secondary | ICD-10-CM | POA: Insufficient documentation

## 2022-10-24 DIAGNOSIS — Z7982 Long term (current) use of aspirin: Secondary | ICD-10-CM | POA: Diagnosis not present

## 2022-10-24 DIAGNOSIS — F17200 Nicotine dependence, unspecified, uncomplicated: Secondary | ICD-10-CM | POA: Diagnosis not present

## 2022-10-24 DIAGNOSIS — S93432A Sprain of tibiofibular ligament of left ankle, initial encounter: Secondary | ICD-10-CM | POA: Insufficient documentation

## 2022-10-24 DIAGNOSIS — S99912A Unspecified injury of left ankle, initial encounter: Secondary | ICD-10-CM | POA: Diagnosis present

## 2022-10-24 MED ORDER — ACETAMINOPHEN 500 MG PO TABS
1000.0000 mg | ORAL_TABLET | Freq: Once | ORAL | Status: AC
  Administered 2022-10-24: 1000 mg via ORAL
  Filled 2022-10-24: qty 2

## 2022-10-24 NOTE — ED Provider Notes (Signed)
[facility] Provider Note   CSN: [id number] Arrival date & time: 10/24/22  1421     History  Chief Complaint  Patient presents with   Ankle Pain    Thomas Hopkins is a 37 y.o. male.  Past medical history consisting of DVT, on anticoagulation with aspirin and Plavix, hyperlipidemia, morbid obesity, tobacco abuse presents to the emergency department for evaluation of left ankle pain.  Patient states yesterday he was getting out of his truck, rolled his left ankle and developed lateral ankle pain and swelling.  No other trauma or injury to his body.  Patient denies any pain or discomfort.  He has not any medications for pain.  He was originally able to bear some weight but has been having increasing pain and swelling with painful weightbearing today.  He is having a hard time walking.  HPI     Home Medications Prior to Admission medications   Medication Sig Start Date End Date Taking? Authorizing Provider  aspirin 325 MG tablet Take 325 mg by mouth daily.    [provider]  atorvastatin (LIPITOR) 40 MG tablet TAKE 1 TABLET BY MOUTH EVERY DAY AFTER 6PM 08/24/22   Larae Grooms, NP  clopidogrel (PLAVIX) 75 MG tablet TAKE 1 TABLET BY MOUTH EVERY DAY 09/13/22   Schnier, Latina Craver, MD  omeprazole (PRILOSEC) 40 MG capsule Take 1 capsule (40 mg total) by mouth daily before breakfast. 10/04/22 02/01/23  Toney Reil, MD      Allergies    Bee venom, Morphine, Vicodin [hydrocodone-acetaminophen], Hydrocodone-acetaminophen, Mushroom extract complex, and Olive oil    Review of Systems   Review of Systems  Physical Exam Updated Vital Signs BP 131/73   Pulse 66   Temp 98 F (36.7 C)   Resp 18   Ht 5\' 8"  (1.727 m)   Wt 134 kg   SpO2 99%   BMI 44.92 kg/m  Physical Exam Constitutional:      Appearance: He is well-developed.  HENT:     Head: Normocephalic and atraumatic.  Eyes:     Conjunctiva/sclera: Conjunctivae  normal.  Cardiovascular:     Rate and Rhythm: Normal rate.  Pulmonary:     Effort: Pulmonary effort is normal. No respiratory distress.  Musculoskeletal:        General: Normal range of motion.     Cervical back: Normal range of motion.     Comments: Left lower extremity with swelling throughout the foot and ankle.  Tender along the lateral malleolus and ATFL region.  No fifth metatarsal or tarsal tenderness.  Nontender along the medial mall or deltoid ligament.  Patient has no Achilles or calf tenderness.  No along the proximal tib-fib region with compression.  Patient able to maintain active extension of the knee.  Hip moves well with internal ex rotation.  Skin:    General: Skin is warm.     Findings: No rash.  Neurological:     Mental Status: He is alert and oriented to person, place, and time.  Psychiatric:        Behavior: Behavior normal.        Thought Content: Thought content normal.     ED Results / Procedures / Treatments   Labs (all labs ordered are listed, but only abnormal results are displayed) Labs Reviewed - No data to display  EKG None  Radiology DG Ankle Complete Left  Result Date: 10/24/2022 CLINICAL DATA:  Lateral ankle pain, recent injury, swelling  EXAM: LEFT ANKLE COMPLETE - 3+ VIEW COMPARISON:  10/24/2022 FINDINGS: Diffuse lower leg and ankle soft tissue swelling. No acute osseous finding, fracture or malalignment. No large joint effusion. No joint abnormality. IMPRESSION: Soft tissue swelling. No acute osseous finding. Electronically Signed   By: Judie Petit.  Shick M.D.   On: 10/24/2022 15:59   DG Foot Complete Left  Result Date: 10/24/2022 CLINICAL DATA:  Fall, pain EXAM: LEFT FOOT - COMPLETE 3+ VIEW COMPARISON:  None Available. FINDINGS: There is no evidence of fracture or dislocation. There is no evidence of arthropathy or other focal bone abnormality. Diffuse ankle and foot soft tissue swelling noted. IMPRESSION: Soft tissue swelling. No acute osseous finding.  Electronically Signed   By: Judie Petit.  Shick M.D.   On: 10/24/2022 15:41    Procedures Procedures    Medications Ordered in ED Medications  acetaminophen (TYLENOL) tablet 1,000 mg (1,000 mg Oral Given 10/24/22 1549)    ED Course/ Medical Decision Making/ A&P                                Medical Decision Making Amount and/or Complexity of Data Reviewed Radiology: ordered.  Risk OTC drugs.   37 year old with left ankle pain from a fall yesterday.  Patient states he was getting out of his truck and his ankle rolled.  He has lateral ankle pain along the ATFL.  No evidence of fracture in the foot or ankle based on x-rays that were ordered and independently reviewed by me today.  He has no sign of proximal tib-fib pain/discomfort.  He is ambulatory but with a limp.  Will place into a cam boot and give crutches.  He will continue with Tylenol as needed for pain.  He is given a note for work for this upcoming Friday Saturday and Sunday as he only works Friday through Sundays. Final Clinical Impression(s) / ED Diagnoses Final diagnoses:  Sprain of anterior talofibular ligament of left ankle, initial encounter    Rx / DC Orders ED Discharge Orders     None         Ronnette Juniper 10/24/22 1621    Pilar Jarvis, MD 10/24/22 1740

## 2022-10-24 NOTE — Telephone Encounter (Signed)
      Chief Complaint: Left ankle pain, swelling. Decreased ROM. Symptoms: Above Frequency: Yesterday, fell at work. Pertinent Negatives: Patient denies  Disposition: [] ED /[x] Urgent Care (no appt availability in office) / [] Appointment(In office/virtual)/ []  [facility]/ [] Home Care/ [] Refused Recommended Disposition /[]  Mobile Bus/ []  Follow-up with PCP Additional Notes: No availability today in the practice.  Reason for Disposition  [1] SEVERE pain (e.g., excruciating, unable to walk) AND [2] not improved after 2 hours of pain medicine  Answer Assessment - Initial Assessment Questions 1. ONSET: "When did the pain start?"      Yesterday 2. LOCATION: "Where is the pain located?"      Left ankle 3. PAIN: "How bad is the pain?"    (Scale 1-10; or mild, moderate, severe)  - MILD (1-3): doesn't interfere with normal activities.   - MODERATE (4-7): interferes with normal activities (e.g., work or school) or awakens from sleep, limping.   - SEVERE (8-10): excruciating pain, unable to do any normal activities, unable to walk.      Walking - 8 4. WORK OR EXERCISE: "Has there been any recent work or exercise that involved this part of the body?"      Yes 5. CAUSE: "What do you think is causing the ankle pain?"     Fall 6. OTHER SYMPTOMS: "Do you have any other symptoms?" (e.g., calf pain, rash, fever, swelling)     Swelling 7. PREGNANCY: "Is there any chance you are pregnant?" "When was your last menstrual period?"     N/A  Protocols used: Ankle Pain-A-AH

## 2022-10-24 NOTE — ED Triage Notes (Signed)
Pt comes with c/o left ankle pain. Pt states he injured it yesterday. Pt states pain when walking on it.

## 2022-10-24 NOTE — Discharge Instructions (Signed)
Please rest ice and elevate left lower extremity.  Use cam boot with ambulation.  Use crutches as needed for ambulation until you can walk without a limp.  Use Tylenol as needed for pain.  Follow-up with orthopedics if no improvement in 1 week.

## 2022-10-26 ENCOUNTER — Telehealth: Payer: Self-pay

## 2022-10-26 NOTE — Telephone Encounter (Signed)
-----   Message from [facility] sent at 10/26/2022 11:59 AM EDT ----- Patient needs TOC call completed please.

## 2022-10-26 NOTE — Transitions of Care (Post Inpatient/ED Visit) (Signed)
   10/26/2022  Name: Thomas Hopkins MRN: [id number] DOB: [birth year]  Today's TOC FU Call Status: Today's TOC FU Call Status:: Successful TOC FU Call Completed TOC FU Call Complete Date: 10/26/22  Transition Care Management Follow-up Telephone Call Date of Discharge: 10/24/22 Discharge Facility: [facility]) Type of Discharge: Emergency Department Reason for ED Visit: Other: How have you been since you were released from the hospital?: Better Any questions or concerns?: No  Items Reviewed: Did you receive and understand the discharge instructions provided?: Yes Medications obtained,verified, and reconciled?: Yes (Medications Reviewed) Any new allergies since your discharge?: No Dietary orders reviewed?: NA Do you have support at home?: No  Medications Reviewed Today: Medications Reviewed Today   Medications were not reviewed in this encounter     Home Care and Equipment/Supplies: Were Home Health Services Ordered?: NA Any new equipment or medical supplies ordered?: NA  Functional Questionnaire: Do you need assistance with bathing/showering or dressing?: No Do you need assistance with meal preparation?: No Do you need assistance with eating?: No Do you have difficulty maintaining continence: No Do you need assistance with getting out of bed/getting out of a chair/moving?: No Do you have difficulty managing or taking your medications?: No  Follow up appointments reviewed: PCP Follow-up appointment confirmed?: Yes Date of PCP follow-up appointment?: 10/27/22 Specialist Hospital Follow-up appointment confirmed?: NA Do you need transportation to your follow-up appointment?: No Do you understand care options if your condition(s) worsen?: Yes-patient verbalized understanding    SIGNATURE Malen Gauze, CMA

## 2022-10-27 ENCOUNTER — Ambulatory Visit (INDEPENDENT_AMBULATORY_CARE_PROVIDER_SITE_OTHER): Payer: Managed Care, Other (non HMO) | Admitting: Physician Assistant

## 2022-10-27 ENCOUNTER — Encounter: Payer: Self-pay | Admitting: Physician Assistant

## 2022-10-27 VITALS — BP 113/77 | HR 69 | Ht 68.0 in | Wt 290.8 lb

## 2022-10-27 DIAGNOSIS — S93402D Sprain of unspecified ligament of left ankle, subsequent encounter: Secondary | ICD-10-CM | POA: Diagnosis not present

## 2022-10-27 DIAGNOSIS — S86002A Unspecified injury of left Achilles tendon, initial encounter: Secondary | ICD-10-CM

## 2022-10-27 MED ORDER — OXYCODONE-ACETAMINOPHEN 2.5-325 MG PO TABS
1.0000 | ORAL_TABLET | Freq: Three times a day (TID) | ORAL | 0 refills | Status: DC | PRN
Start: 2022-10-27 — End: 2022-11-08

## 2022-10-27 NOTE — Progress Notes (Signed)
Acute Office Visit   Patient: Thomas Hopkins   DOB: [birth year]   37 y.o. Male  MRN: [id number] Visit Date: 10/27/2022  Today's healthcare provider: Oswaldo Conroy Affan Callow, PA-C  Introduced myself to the patient as a Secondary school teacher and provided education on APPs in clinical practice.    Chief Complaint  Patient presents with   Ankle Injury    Patient was recently seen in ED for Sprain of L ankle on Sunday. Patient says he is still noticing some swelling in the area. Patient says he has tried Tylenol and it does not seem to help and he cannot take Ibuprofen due to him being on blood thinners.    Hospitalization Follow-up   Subjective    HPI HPI     Ankle Injury    Additional comments: Patient was recently seen in ED for Sprain of L ankle on Sunday. Patient says he is still noticing some swelling in the area. Patient says he has tried Tylenol and it does not seem to help and he cannot take Ibuprofen due to him being on blood thinners.       Last edited by Malen Gauze, CMA on 10/27/2022  3:14 PM.       Ankle injury Onset: sudden  Reports he rolled his left ankle on 10/23/22  He states his whole foot has been swelling - he has been elevating it at home and using ice packs but this has not helped  He has been using Tylenol for pain but reports persistent pain. Reports he is not taking it much due to lack of efficacy  He was given Cam boot in ED - reports he has been using here and there  He reports today he is having pain in lower part of calf and in ankle/ foot area   He states he gets sick to his stomach with Vicodin but has never had an issue with Oxycodone   Medications: Outpatient Medications Prior to Visit  Medication Sig   aspirin 325 MG tablet Take 325 mg by mouth daily.   atorvastatin (LIPITOR) 40 MG tablet TAKE 1 TABLET BY MOUTH EVERY DAY AFTER 6PM   clopidogrel (PLAVIX) 75 MG tablet TAKE 1 TABLET BY MOUTH EVERY DAY   omeprazole (PRILOSEC) 40 MG capsule Take 1  capsule (40 mg total) by mouth daily before breakfast.   No facility-administered medications prior to visit.    Review of Systems  Musculoskeletal:  Positive for myalgias.       Left ankle pain and swelling         Objective    BP 113/77   Pulse 69   Ht 5\' 8"  (1.727 m)   Wt 290 lb 12.8 oz (131.9 kg)   SpO2 97%   BMI 44.22 kg/m     Physical Exam Vitals reviewed.  Constitutional:      General: He is awake.     Appearance: Normal appearance. He is well-developed and well-groomed.  HENT:     Head: Normocephalic and atraumatic.  Musculoskeletal:     Left ankle: Swelling and ecchymosis present. Tenderness present over the lateral malleolus and ATF ligament. Decreased range of motion. Normal pulse.     Left Achilles Tendon: Tenderness present.     Comments: Reports pain and tenderness along posterior left leg  Mild bulging along posterior calf  Unable to assess with Tompson's testing for achilles tendon rupture  He is able to mildly plantar flex on his own  and move toes minimally  Mild bruising present along toes and lateral left ankle   Neurological:     Mental Status: He is alert.  Psychiatric:        Behavior: Behavior is cooperative.       No results found for any visits on 10/27/22.  Assessment & Plan      No follow-ups on file.      Problem List Items Addressed This Visit   None Visit Diagnoses     Sprain of left ankle, unspecified ligament, subsequent encounter    -  Primary Acute, ongoing concern Reviewed ED visit notes, imaging results and labs from visit on 10/24/22 Sprained left ankle on 10/23/22 and pain, swelling is not improving despite rest, ice packs and Tylenol He reports reduced ROM of foot and toes today as well as pain in posterior calf Will provide limited script for percocet to assist with pain Recommend warm compresses and continued elevations for swelling.  Will send for MRI ankle to assess for potential Achilles tendon injury as  there is bulging in posterior calf and tenderness Results of imaging to dictate further management Follow up as needed for persistent or progressing symptoms     Relevant Medications   oxycodone-acetaminophen (PERCOCET) 2.5-325 MG tablet   Other Relevant Orders   MR ANKLE LEFT WO CONTRAST   Unspecified injury of left Achilles tendon, initial encounter       Relevant Medications   oxycodone-acetaminophen (PERCOCET) 2.5-325 MG tablet   Other Relevant Orders   MR ANKLE LEFT WO CONTRAST        No follow-ups on file.   I, Teruo Stilley E Shan Valdes, PA-C, have reviewed all documentation for this visit. The documentation on 10/28/22 for the exam, diagnosis, procedures, and orders are all accurate and complete.   Jacquelin Hawking, MHS, PA-C [facility]

## 2022-11-02 ENCOUNTER — Ambulatory Visit
Admission: RE | Admit: 2022-11-02 | Discharge: 2022-11-02 | Disposition: A | Discharge status: Home / Self Care | Payer: 59 | Source: Ambulatory Visit | Attending: Physician Assistant | Admitting: Physician Assistant

## 2022-11-02 DIAGNOSIS — S93402D Sprain of unspecified ligament of left ankle, subsequent encounter: Secondary | ICD-10-CM | POA: Insufficient documentation

## 2022-11-02 DIAGNOSIS — S86002A Unspecified injury of left Achilles tendon, initial encounter: Secondary | ICD-10-CM | POA: Insufficient documentation

## 2022-11-04 ENCOUNTER — Telehealth: Payer: Self-pay | Admitting: Nurse Practitioner

## 2022-11-04 ENCOUNTER — Encounter: Payer: Self-pay | Admitting: Family Medicine

## 2022-11-04 NOTE — Telephone Encounter (Signed)
Spoke with patient and made him aware of Erin's recommendations. Patient verbalized understanding. Patient is asking if the provider can write him a work note that he can stop by the office today to pick up due to him being out of work. Please advise?

## 2022-11-04 NOTE — Telephone Encounter (Signed)
Copied from CRM [id number]. Topic: General - Other >> Nov 03, 2022  4:29 PM Phill Myron wrote: Please call Mr. Diebel regarding the results of his MRI (it does not look like Denny Peon has read them yet). He also stated he will need a work excuse note, please

## 2022-11-07 ENCOUNTER — Telehealth: Payer: Self-pay | Admitting: Nurse Practitioner

## 2022-11-07 NOTE — Telephone Encounter (Signed)
Copied from CRM [id number]. Topic: General - Other >> Nov 07, 2022  9:32 AM Phill Myron wrote: Thomas Hopkins called and stated the medication oxycodone-acetaminophen (PERCOCET) 2.5-325 MG tablet, is not carried at the pharmacy, pharmacy stated dose was too low. Thomas. Hopkins states he can tolerate Tylenol 3 if the other medication is not available.   Thomas Hopkins also asked if his MRI results are available.

## 2022-11-08 ENCOUNTER — Other Ambulatory Visit: Payer: Self-pay

## 2022-11-08 ENCOUNTER — Encounter: Payer: Self-pay | Admitting: *Deleted

## 2022-11-08 ENCOUNTER — Emergency Department: Payer: 59

## 2022-11-08 ENCOUNTER — Emergency Department
Admission: EM | Admit: 2022-11-08 | Discharge: 2022-11-08 | Disposition: A | Discharge status: Home / Self Care | Payer: 59 | Attending: Emergency Medicine | Admitting: Emergency Medicine

## 2022-11-08 ENCOUNTER — Ambulatory Visit: Payer: Self-pay

## 2022-11-08 DIAGNOSIS — M25572 Pain in left ankle and joints of left foot: Secondary | ICD-10-CM | POA: Diagnosis present

## 2022-11-08 DIAGNOSIS — F17200 Nicotine dependence, unspecified, uncomplicated: Secondary | ICD-10-CM | POA: Insufficient documentation

## 2022-11-08 DIAGNOSIS — I82432 Acute embolism and thrombosis of left popliteal vein: Secondary | ICD-10-CM | POA: Insufficient documentation

## 2022-11-08 DIAGNOSIS — Z7902 Long term (current) use of antithrombotics/antiplatelets: Secondary | ICD-10-CM | POA: Insufficient documentation

## 2022-11-08 DIAGNOSIS — I82412 Acute embolism and thrombosis of left femoral vein: Secondary | ICD-10-CM | POA: Insufficient documentation

## 2022-11-08 LAB — BASIC METABOLIC PANEL
Anion gap: 9 (ref 5–15)
BUN: 9 mg/dL (ref 6–20)
CO2: 24 mmol/L (ref 22–32)
Calcium: 8.3 mg/dL — ABNORMAL LOW (ref 8.9–10.3)
Chloride: 103 mmol/L (ref 98–111)
Creatinine, Ser: 0.72 mg/dL (ref 0.61–1.24)
GFR, Estimated: >60 mL/min (ref >60)
Glucose, Bld: 92 mg/dL (ref 70–99)
Potassium: 3.7 mmol/L (ref 3.5–5.1)
Sodium: 136 mmol/L (ref 135–145)

## 2022-11-08 LAB — CBC
HCT: 42.8 % (ref 39.0–52.0)
Hemoglobin: 14 g/dL (ref 13.0–17.0)
MCH: 29.4 pg (ref 26.0–34.0)
MCHC: 32.7 g/dL (ref 30.0–36.0)
MCV: 89.9 fL (ref 80.0–100.0)
Platelets: 236 10*3/uL (ref 150–400)
RBC: 4.76 MIL/uL (ref 4.22–5.81)
RDW: 13 % (ref 11.5–15.5)
WBC: 12.9 10*3/uL — ABNORMAL HIGH (ref 4.0–10.5)
nRBC: 0 % (ref 0.0–0.2)

## 2022-11-08 MED ORDER — ENOXAPARIN SODIUM 150 MG/ML IJ SOSY
130.0000 mg | PREFILLED_SYRINGE | Freq: Once | INTRAMUSCULAR | Status: AC
  Administered 2022-11-08: 130 mg via SUBCUTANEOUS
  Filled 2022-11-08: qty 0.86

## 2022-11-08 MED ORDER — APIXABAN (ELIQUIS) VTE STARTER PACK (10MG AND 5MG): ORAL_TABLET | ORAL | 0 refills | Status: DC

## 2022-11-08 MED ORDER — OXYCODONE-ACETAMINOPHEN 5-325 MG PO TABS
1.0000 | ORAL_TABLET | Freq: Four times a day (QID) | ORAL | 0 refills | Status: DC | PRN
Start: 2022-11-08 — End: 2023-05-25

## 2022-11-08 MED ORDER — ACETAMINOPHEN-CODEINE 300-30 MG PO TABS
1.0000 | ORAL_TABLET | Freq: Once | ORAL | Status: AC
  Administered 2022-11-08: 1 via ORAL
  Filled 2022-11-08: qty 1

## 2022-11-08 NOTE — Telephone Encounter (Signed)
    Chief Complaint: Continued pain in ankle, cannot take Percocet, causes nausea. Asking for Tylenol # 3. Symptoms: Pain, swelling up to knee now and bruises. Pain 10/10. Cannot bend his knee. Frequency: 2 weeks ago Pertinent Negatives: Patient denies  Disposition: [] ED /[] Urgent Care (no appt availability in office) / [] Appointment(In office/virtual)/ []  [facility]/ [] Home Care/ [x] Refused Recommended Disposition /[] [place] [facility]/ []  Follow-up with PCP Additional Notes: Please advise mother.  Reason for Disposition  [1] SEVERE pain (e.g., excruciating, unable to walk) AND [2] not improved after 2 hours of pain medicine  Answer Assessment - Initial Assessment Questions 1. ONSET: "When did the pain start?"      2 weeks ago 2. LOCATION: "Where is the pain located?"      Left ankle 3. PAIN: "How bad is the pain?"    (Scale 1-10; or mild, moderate, severe)  - MILD (1-3): doesn't interfere with normal activities.   - MODERATE (4-7): interferes with normal activities (e.g., work or school) or awakens from sleep, limping.   - SEVERE (8-10): excruciating pain, unable to do any normal activities, unable to walk.      Severe 4. WORK OR EXERCISE: "Has there been any recent work or exercise that involved this part of the body?"      No 5. CAUSE: "What do you think is causing the ankle pain?"     Twisted  6. OTHER SYMPTOMS: "Do you have any other symptoms?" (e.g., calf pain, rash, fever, swelling)     Swelling 7. PREGNANCY: "Is there any chance you are pregnant?" "When was your last menstrual period?"     N/a  Protocols used: Ankle Pain-A-AH

## 2022-11-08 NOTE — Telephone Encounter (Signed)
Spoke with patient and made him aware of patient aware of Erin Mecum, PA recommendations. Patient verbalized understanding and says he will go to the ED. Patient says his is having swelling in his calf muscle more than the ankle now.

## 2022-11-08 NOTE — Telephone Encounter (Signed)
Patient's mother called back and would like to know if PCP can prescribe tylenol #3 instead of percocet and get MRI results for foot/ ankle. Please advise . Patient mother requesting call back today due to NT recommended if pain severe and uncontrolled go to ED. Please advise.

## 2022-11-08 NOTE — ED Triage Notes (Signed)
Pt to ED for continued left ankle pain since 8/12, reports was told has achilles tendon injury. Reports pain is radiating up the leg.

## 2022-11-08 NOTE — Discharge Instructions (Addendum)
Please take the Eliquis according to the package instructions.  Follow-up with your PCP for further management of your DVT.  Keep an eye out for a call from hematology as well, they can help manage this.  You can take the Percocet every 6 hours as needed for severe pain.  This is a narcotic pain medication so you cannot drive or operate heavy machinery while taking this med.  You can take Tylenol as needed for mild to moderate pain.

## 2022-11-08 NOTE — Telephone Encounter (Signed)
This encounter was created in error - please disregard.

## 2022-11-08 NOTE — Telephone Encounter (Signed)
Sent to CMA 

## 2022-11-08 NOTE — ED Provider Notes (Signed)
Shared visit  Patient has a past medical history significant for prior DVT.  Initial DVT questionably provoked from acute appendicitis and was on Eliquis for 1 year.  Patient did followed up with hematology.  Also has a history of peripheral arterial disease and is followed by vascular surgery.  Patient is currently on aspirin and Plavix.  Holding Plavix for the past 5 days for endoscopy.  Ongoing left leg swelling which brought him into the emergency department.  Currently being worked up for an Achilles tendon injury with MRI.  DVT ultrasound concerning for acute DVT to the left femoral and left popliteal vein.  Lab work reassuring with normal creatinine.  Patient was given treatment dose of Lovenox.  Given Eliquis starter pack.  Given return precautions for any worsening symptoms.  Hematology referral placed given his unprovoked DVT.  Discussed close follow-up with primary care physician and given return precautions for any signs of bleeding, chest pain or shortness of breath   Corena Herter, MD 11/08/22 2249

## 2022-11-08 NOTE — ED Provider Notes (Signed)
[facility] Provider Note    Event Date/Time   First MD Initiated Contact with Patient 11/08/22 1839     (approximate)   History   Ankle Pain   HPI  Thomas Hopkins is a 37 y.o. 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 Patient was seen by this ED on 8/12 for ankle sprain.  Patient was then seen by his PCP who was concerned about a possible Achilles tendon tear as he had some increased calf swelling.  Patient was sent for an MRI that has not resulted yet.  He reached out to his PCP today for additional pain medication as his pain has increased, she recommended he come to the ED for evaluation of a DVT.  Patient was originally placed in a cam boot but has not been able to wear it due to to his calf being so swollen.     Physical Exam   Triage Vital Signs: ED Triage Vitals  Encounter Vitals Group     BP 11/08/22 1809 (!) 160/107     Systolic BP Percentile --      Diastolic BP Percentile --      Pulse Rate 11/08/22 1809 83     Resp 11/08/22 1809 20     Temp 11/08/22 1809 98.6 F (37 C)     Temp src --      SpO2 11/08/22 1809 97 %     Weight 11/08/22 1807 288 lb 12.8 oz (131 kg)     Height 11/08/22 1807 5\' 8"  (1.727 m)     Head Circumference --      Peak Flow --      Pain Score 11/08/22 1807 10     Pain Loc --      Pain Education --      Exclude from Growth Chart --     Most recent vital signs: Vitals:   11/08/22 1809  BP: (!) 160/107  Pulse: 83  Resp: 20  Temp: 98.6 F (37 C)  SpO2: 97%    General: Awake, no distress.  CV:  Good peripheral perfusion.  Resp:  Normal effort.  Abd:  No distention.  Other:  Left ankle and calf swollen when compared to right, very tender to palpation on the foot and ankle, dorsalis pedis pulse is 1+ and regular, Thompson test is negative.   ED Results / Procedures / Treatments   Labs (all labs ordered are listed, but only abnormal  results are displayed) Labs Reviewed  CBC - Abnormal; Notable for the following components:      Result Value   WBC 12.9 (*)    All other components within normal limits  BASIC METABOLIC PANEL - Abnormal; Notable for the following components:   Calcium 8.3 (*)    All other components within normal limits     RADIOLOGY  Venous ultrasound of the left lower extremity obtained, interpreted the images as well as reviewed the radiologist report.  Ultrasound showed a DVT in the left femoral and left popliteal vein.   PROCEDURES:  Critical Care performed: No  Procedures   MEDICATIONS ORDERED IN ED: Medications  enoxaparin (LOVENOX) injection 130 mg (has no administration in time range)  acetaminophen-codeine (TYLENOL #3) 300-30 MG per tablet 1 tablet (1 tablet Oral Given 11/08/22 2020)     IMPRESSION / MDM / ASSESSMENT AND PLAN / ED COURSE  I reviewed the triage vital signs and  the nursing notes.                             37 year old male presents for ongoing ankle pain and increased swelling.  Patient was hypertensive in triage otherwise VSS.  Patient in moderate distress on exam states he is in severe pain.  Differential diagnosis includes, but is not limited to, DVT, ankle sprain, Achilles tendon tear.  Patient's presentation is most consistent with acute complicated illness / injury requiring diagnostic workup.  Patient was given Tylenol 3 for his pain, I did confirm with him that he has tolerated this medication well before, despite his allergy to hydrocodone.  Given patient's history of DVT and his increased swelling, ultrasound was ordered to evaluate this.  I interpreted the images as well as reviewed the radiologist report.  Patient has an occlusive DVT within the left femoral and left popliteal vein.  Patient denies any chest pain and shortness of breath at this time, so I do not feel a chest CT to evaluate for PE is warranted.  CBC notable for mildly elevated white  count, and BMP unremarkable.  Patient was previously taking aspirin and Plavix for an arterial clot, however he was scheduled to have an EGD on Thursday and was instructed by gastroenterology to hold the medications.  He was previously on Eliquis for DVT management but was taken off of the medication due to cost patient did not have insurance.  I will start him on Eliquis today.  He will also be given a treatment dose of Lovenox while in the ED today.  Patient advised to follow-up with hematology and his PCP.  I will also send him with pain medication for his ankle.  Patient was agreeable to plan, voiced understanding and all questions were answered.     FINAL CLINICAL IMPRESSION(S) / ED DIAGNOSES   Final diagnoses:  Acute deep vein thrombosis (DVT) of femoral vein of left lower extremity (HCC)  Acute deep vein thrombosis (DVT) of popliteal vein of left lower extremity (HCC)     Rx / DC Orders   ED Discharge Orders          Ordered    APIXABAN (ELIQUIS) VTE STARTER PACK (10MG  AND 5MG )       Note to Pharmacy: If starter pack unavailable, substitute with seventy-four 5 mg apixaban tabs following the above SIG directions.   11/08/22 2308    oxyCODONE-acetaminophen (PERCOCET) 5-325 MG tablet  Every 6 hours PRN        11/08/22 2308    Ambulatory referral to Hematology / Oncology       Comments: Your emergency department provider has referred you to see a hematology/oncology specialist. These are physicians who specialize in blood disorders and cancers, or findings concerning for cancer. You will receive a phone call from the [facility] Office to set up your appointment within 2 business days: [employer] operate Mon - Fri, 8:00 a.m. to 5:00 p.m.; closed for federally recognized holidays. Please be sure your phone is not set to block numbers during this time.   11/08/22 2310             Note:  This document was prepared using Dragon voice recognition software and may include  unintentional dictation errors.   Cameron Ali, PA-C 11/08/22 2312    Corena Herter, MD 11/09/22 [phone]

## 2022-11-10 ENCOUNTER — Telehealth: Payer: Self-pay

## 2022-11-10 ENCOUNTER — Ambulatory Visit: Admission: RE | Admit: 2022-11-10 | Payer: 59 | Source: Home / Self Care | Admitting: Gastroenterology

## 2022-11-10 ENCOUNTER — Encounter: Admission: RE | Payer: Self-pay | Source: Home / Self Care

## 2022-11-10 SURGERY — ESOPHAGOGASTRODUODENOSCOPY (EGD) WITH PROPOFOL
Anesthesia: General

## 2022-11-10 NOTE — Transitions of Care (Post Inpatient/ED Visit) (Signed)
   11/10/2022  Name: Thomas Hopkins MRN: [id number] DOB: [birth year]  Today's TOC FU Call Status: Today's TOC FU Call Status:: Successful TOC FU Call Completed TOC FU Call Complete Date: 11/10/22 Patient's Name and Date of Birth confirmed.  Transition Care Management Follow-up Telephone Call Date of Discharge: 11/08/22 Discharge Facility: [facility]) Type of Discharge: Emergency Department Reason for ED Visit: Other: How have you been since you were released from the hospital?: Same Any questions or concerns?: No  Items Reviewed: Did you receive and understand the discharge instructions provided?: Yes Medications obtained,verified, and reconciled?: Yes (Medications Reviewed) Any new allergies since your discharge?: No Dietary orders reviewed?: No Do you have support at home?: Yes  Medications Reviewed Today: Medications Reviewed Today     Reviewed by Pablo Ledger, CMA (Certified Medical Assistant) on 11/10/22 at 1047  Med List Status: <None>   Medication Order Taking? Sig Documenting Provider Last Dose Status Informant  APIXABAN (ELIQUIS) VTE STARTER PACK (10MG  AND 5MG ) [id number] Yes Take as directed on package: start with two-5mg  tablets twice daily for 7 days. On day 8, switch to one-5mg  tablet twice daily. Cameron Ali, PA-C Taking Active   aspirin 325 MG tablet [id number] Yes Take 325 mg by mouth daily. [provider] Taking Active   atorvastatin (LIPITOR) 40 MG tablet [id number] Yes TAKE 1 TABLET BY MOUTH EVERY DAY AFTER Demaris Callander, NP Taking Active   clopidogrel (PLAVIX) 75 MG tablet [id number] Yes TAKE 1 TABLET BY MOUTH EVERY DAY Schnier, Latina Craver, MD Taking Active   omeprazole (PRILOSEC) 40 MG capsule [id number] Yes Take 1 capsule (40 mg total) by mouth daily before breakfast. Toney Reil, MD Taking Active   oxyCODONE-acetaminophen (PERCOCET) 5-325 MG tablet [id number] Yes Take 1 tablet by mouth every 6  (six) hours as needed for severe pain. Cameron Ali, PA-C Taking Active             Home Care and Equipment/Supplies: Were Home Health Services Ordered?: No Any new equipment or medical supplies ordered?: No  Functional Questionnaire: Do you need assistance with bathing/showering or dressing?: No Do you need assistance with meal preparation?: No Do you need assistance with eating?: No Do you have difficulty maintaining continence: No Do you need assistance with getting out of bed/getting out of a chair/moving?: No Do you have difficulty managing or taking your medications?: No  Follow up appointments reviewed: PCP Follow-up appointment confirmed?: No MD Provider Line Number:[phone] Given: No Specialist Hospital Follow-up appointment confirmed?: Yes Date of Specialist follow-up appointment?: 11/11/22 Follow-Up Specialty Provider:: Dr. Alena Bills- Hematology/Oncology Do you need transportation to your follow-up appointment?: No Do you understand care options if your condition(s) worsen?: Yes-patient verbalized understanding    SIGNATURE: Wilhemena Durie, CMA

## 2022-11-11 ENCOUNTER — Inpatient Hospital Stay: Payer: Managed Care, Other (non HMO) | Attending: Internal Medicine | Admitting: Internal Medicine

## 2022-11-11 ENCOUNTER — Encounter: Payer: Self-pay | Admitting: Internal Medicine

## 2022-11-11 ENCOUNTER — Inpatient Hospital Stay: Payer: Managed Care, Other (non HMO)

## 2022-11-11 VITALS — BP 137/80 | HR 84 | Temp 99.9°F | Wt 297.0 lb

## 2022-11-11 DIAGNOSIS — I82412 Acute embolism and thrombosis of left femoral vein: Secondary | ICD-10-CM | POA: Insufficient documentation

## 2022-11-11 DIAGNOSIS — Z7901 Long term (current) use of anticoagulants: Secondary | ICD-10-CM | POA: Diagnosis not present

## 2022-11-11 DIAGNOSIS — Z7982 Long term (current) use of aspirin: Secondary | ICD-10-CM

## 2022-11-11 DIAGNOSIS — Z800 Family history of malignant neoplasm of digestive organs: Secondary | ICD-10-CM

## 2022-11-11 DIAGNOSIS — I82439 Acute embolism and thrombosis of unspecified popliteal vein: Secondary | ICD-10-CM

## 2022-11-11 DIAGNOSIS — I739 Peripheral vascular disease, unspecified: Secondary | ICD-10-CM | POA: Insufficient documentation

## 2022-11-11 DIAGNOSIS — I82432 Acute embolism and thrombosis of left popliteal vein: Secondary | ICD-10-CM | POA: Insufficient documentation

## 2022-11-11 DIAGNOSIS — K219 Gastro-esophageal reflux disease without esophagitis: Secondary | ICD-10-CM | POA: Diagnosis not present

## 2022-11-11 DIAGNOSIS — Z7902 Long term (current) use of antithrombotics/antiplatelets: Secondary | ICD-10-CM | POA: Diagnosis not present

## 2022-11-11 NOTE — Progress Notes (Signed)
Patient is having some pain in his left foot and leg he rates it at above a 10.

## 2022-11-11 NOTE — Progress Notes (Signed)
[facility]  [phone] [phone]  ID: Thomas Hopkins OB: November 01, 1985  MR#: [id number]  SAY#:[id number]  Patient Care Team: Larae Grooms, NP as PCP - General (Nurse Practitioner)  REFERRING PROVIDER: Cruz Condon  REASON FOR REFERRAL: Left lower extremity DVT  HPI: Thomas Hopkins is a 37 y.o. male with past medical history of left lower extremity DVT in 2016 (was considered provoked post appendicitis surgery) treated with 1 year Eliquis, PAD s/p thrombectomy on aspirin and Plavix, GERD, hyperlipidemia was referred to hematology for recurrent left lower extremity DVT.  Patient had an injury at work and sprained left ankle. With worsening leg swelling, he had Dopplers on 11/08/2022 showed occlusive DVT within the left femoral and popliteal vein.  He is on Eliquis.  History of DVT in 2016-post appendicectomy about 2 weeks later.  Venous Doppler showed DVT in both extremities located within distal left popliteal and left posterior tibial vein and right posterior tibial vein.  Treated with little over a year of Eliquis.  Patient reports was seen by hematology and was  offered hypercoagulable workup but did not pursue due to high co-pays.  History of right leg thromboembolectomy of the tibial vessel and angioplasty of the PT on 11/18/2016.  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  So on aspirin and Plavix.  GERD-follows with Dr. Allegra Lai.  Was taken off aspirin and Plavix for endoscopy which is now canceled with new DVT.  Patient was seen today accompanied with the parents.  Complaining of pain in his left ankle.  Swelling present.  REVIEW OF SYSTEMS:   ROS  As per HPI. Otherwise, a complete review of systems is negative.  PAST MEDICAL HISTORY: Past Medical History:  Diagnosis Date   DVT of lower extremity, bilateral (HCC)    stopped Eliquis in 2017   GERD (gastroesophageal reflux disease)     Hyperlipidemia     PAST SURGICAL HISTORY: Past Surgical History:  Procedure Laterality Date   ABDOMINAL AORTOGRAM W/LOWER EXTREMITY Right 11/18/2016   Procedure: ABDOMINAL AORTOGRAM W/LOWER EXTREMITY;  Surgeon: Renford Dills, MD;  Location: [facility] INVASIVE CV LAB;  Service: Cardiovascular;  Laterality: Right;   APPENDECTOMY     LAPAROSCOPIC APPENDECTOMY N/A 10/14/2014   Procedure: APPENDECTOMY LAPAROSCOPIC;  Surgeon: Lattie Haw, MD;  Location: [facility] ORS;  Service: General;  Laterality: N/A;   TONSILLECTOMY     WISDOM TOOTH EXTRACTION      FAMILY HISTORY: Family History  Problem Relation Age of Onset   Diabetes Mother    Rectal cancer Father    Scoliosis Brother    Uterine cancer Maternal Aunt    Diabetes Mellitus II Maternal Grandmother    Hypertension Maternal Grandmother    Heart disease Maternal Grandfather     HEALTH MAINTENANCE: Social History   Tobacco Use   Smoking status: Every Day    Current packs/day: 0.25    Average packs/day: 0.3 packs/day for 15.0 years (3.8 ttl pk-yrs)    Types: Cigarettes   Smokeless tobacco: Never  Vaping Use   Vaping status: Never Used  Substance Use Topics   Alcohol use: Yes    Comment: very occasionally   Drug use: No     Allergies  Allergen Reactions   Bee Venom Hives   Morphine Nausea Only   Vicodin [Hydrocodone-Acetaminophen] Nausea And Vomiting    Headache   Hydrocodone-Acetaminophen Nausea And Vomiting    Headache   Mushroom Extract Complex Nausea And  Vomiting   Olive Oil Nausea And Vomiting    Current Outpatient Medications  Medication Sig Dispense Refill   APIXABAN (ELIQUIS) VTE STARTER PACK (10MG  AND 5MG ) Take as directed on package: start with two-5mg  tablets twice daily for 7 days. On day 8, switch to one-5mg  tablet twice daily. 74 each 0   atorvastatin (LIPITOR) 40 MG tablet TAKE 1 TABLET BY MOUTH EVERY DAY AFTER 6PM 90 tablet 1   omeprazole (PRILOSEC) 40 MG capsule Take 1 capsule (40 mg total) by  mouth daily before breakfast. 30 capsule 3   oxyCODONE-acetaminophen (PERCOCET) 5-325 MG tablet Take 1 tablet by mouth every 6 (six) hours as needed for severe pain. 20 tablet 0   aspirin 325 MG tablet Take 325 mg by mouth daily. (Patient not taking: Reported on 11/11/2022)     clopidogrel (PLAVIX) 75 MG tablet TAKE 1 TABLET BY MOUTH EVERY DAY (Patient not taking: Reported on 11/11/2022) 90 tablet 3   No current facility-administered medications for this visit.    OBJECTIVE: Vitals:   11/11/22 1134  BP: 137/80  Pulse: 84  Temp: 99.9 F (37.7 C)  SpO2: 97%     Body mass index is 45.16 kg/m.      General: Well-developed, well-nourished, no acute distress. Eyes: Pink conjunctiva, anicteric sclera. HEENT: Normocephalic, moist mucous membranes, clear oropharnyx. Lungs: Clear to auscultation bilaterally. Heart: Regular rate and rhythm. No rubs, murmurs, or gallops. Abdomen: Soft, nontender, nondistended. No organomegaly noted, normoactive bowel sounds. Musculoskeletal: No edema, cyanosis, or clubbing. Neuro: Alert, answering all questions appropriately. Cranial nerves grossly intact. Skin: No rashes or petechiae noted. Psych: Normal affect. Lymphatics: No cervical, calvicular, axillary or inguinal LAD.   LAB RESULTS:  Lab Results  Component Value Date   NA 136 11/08/2022   K 3.7 11/08/2022   CL 103 11/08/2022   CO2 24 11/08/2022   GLUCOSE 92 11/08/2022   BUN 9 11/08/2022   CREATININE 0.72 11/08/2022   CALCIUM 8.3 (L) 11/08/2022   PROT 6.8 08/24/2022   ALBUMIN 4.4 08/24/2022   AST 23 08/24/2022   ALT 37 08/24/2022   ALKPHOS 65 08/24/2022   BILITOT 0.3 08/24/2022   GFRNONAA >60 11/08/2022   GFRAA >60 11/28/2017    Lab Results  Component Value Date   WBC 12.9 (H) 11/08/2022   NEUTROABS 5.1 08/24/2022   HGB 14.0 11/08/2022   HCT 42.8 11/08/2022   MCV 89.9 11/08/2022   PLT 236 11/08/2022    No results found for: "TIBC", "FERRITIN", "IRONPCTSAT"   STUDIES: US  Venous Img Lower  Left (DVT Study)  Result Date: 11/08/2022 CLINICAL DATA:  Left lower extremity swelling EXAM: LEFT LOWER EXTREMITY VENOUS DOPPLER ULTRASOUND TECHNIQUE: Gray-scale sonography with graded compression, as well as color Doppler and duplex ultrasound were performed to evaluate the lower extremity deep venous systems from the level of the common femoral vein and including the common femoral, femoral, profunda femoral, popliteal and calf veins including the posterior tibial, peroneal and gastrocnemius veins when visible. The superficial great saphenous vein was also interrogated. Spectral Doppler was utilized to evaluate flow at rest and with distal augmentation maneuvers in the common femoral, femoral and popliteal veins. COMPARISON:  02/19/2022 FINDINGS: Contralateral Common Femoral Vein: Respiratory phasicity is normal and symmetric with the symptomatic side. No evidence of thrombus. Normal compressibility. Common Femoral Vein: No evidence of thrombus. Normal compressibility, respiratory phasicity and response to augmentation. Saphenofemoral Junction: No evidence of thrombus. Normal compressibility and flow on color Doppler imaging. Profunda Femoral Vein: No  evidence of thrombus. Normal compressibility and flow on color Doppler imaging. Femoral Vein: There is occlusive thrombus throughout the left femoral vein. The vessel is noncompressible. No color flow is detected. Popliteal Vein: There is occlusive thrombus throughout the left popliteal vein. The vessel is noncompressible. No color flow is detected. Calf Veins: No evidence of thrombus. Normal compressibility and flow on color Doppler imaging. Superficial Great Saphenous Vein: No evidence of thrombus. Normal compressibility. Other Findings:  None. IMPRESSION: 1. Occlusive DVT within the left femoral and left popliteal vein. These results will be called to the ordering clinician or representative by the Radiologist Assistant, and communication  documented in the PACS or [employer]. Electronically Signed   By: Sharlet Salina M.D.   On: 11/08/2022 20:54   DG Ankle Complete Left  Result Date: 10/24/2022 CLINICAL DATA:  Lateral ankle pain, recent injury, swelling EXAM: LEFT ANKLE COMPLETE - 3+ VIEW COMPARISON:  10/24/2022 FINDINGS: Diffuse lower leg and ankle soft tissue swelling. No acute osseous finding, fracture or malalignment. No large joint effusion. No joint abnormality. IMPRESSION: Soft tissue swelling. No acute osseous finding. Electronically Signed   By: Judie Petit.  Shick M.D.   On: 10/24/2022 15:59   DG Foot Complete Left  Result Date: 10/24/2022 CLINICAL DATA:  Fall, pain EXAM: LEFT FOOT - COMPLETE 3+ VIEW COMPARISON:  None Available. FINDINGS: There is no evidence of fracture or dislocation. There is no evidence of arthropathy or other focal bone abnormality. Diffuse ankle and foot soft tissue swelling noted. IMPRESSION: Soft tissue swelling. No acute osseous finding. Electronically Signed   By: Judie Petit.  Shick M.D.   On: 10/24/2022 15:41    ASSESSMENT AND PLAN:   Thomas Hopkins is a 37 y.o. male with pmh of bilateral lower extremity DVT in 2016 (was considered provoked post appendicitis surgery) treated with 1 year Eliquis, PAD s/p thrombectomy on aspirin and Plavix, GERD, hyperlipidemia was referred to hematology for recurrent left lower extremity DVT.  # Recurrent LE DVT - Patient had an injury at work and sprained left ankle. With worsening leg swelling, he had Dopplers on 11/08/2022 showed occlusive DVT within the left femoral and popliteal vein.  He is on Eliquis.  - History of DVT in 2016-post appendicectomy about 2 weeks later.  Venous Doppler showed DVT in both extremities located within distal left popliteal and left posterior tibial vein and right posterior tibial vein.  Treated with little over a year of Eliquis.  Patient reports was seen by hematology and was  offered hypercoagulable workup but did not pursue due to  high co-pays.  -Considering he had 2 episodes of DVT, I recommended long-term anticoagulation.  I will hold off on hypercoag workup at this time as it will not change the management.  Patient expressed concerns about copayments for Eliquis.  Will look into assistance.  # PAD - History of right leg thromboembolectomy of the tibial vessel and angioplasty of the PT on 11/18/2016.  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 On aspirin and Plavix. -He was recently taken off aspirin and Plavix for consideration of EGD which is now canceled with new DVT. -Advised to reach out to vascular about resumption of aspirin and Plavix.  Orders Placed This Encounter  Procedures   CBC with Differential/Platelet   Comprehensive metabolic panel   RTC in 6 months for MD visit, labs   Patient expressed understanding and was in agreement with this plan. He also understands that He can call  clinic at any time with any questions, concerns, or complaints.   I spent a total of 45 minutes reviewing chart data, face-to-face evaluation with the patient, counseling and coordination of care as detailed above.  Michaelyn Barter, MD   11/11/2022 3:46 PM

## 2022-11-21 ENCOUNTER — Ambulatory Visit (INDEPENDENT_AMBULATORY_CARE_PROVIDER_SITE_OTHER): Payer: 59 | Admitting: Vascular Surgery

## 2022-11-21 NOTE — Progress Notes (Signed)
Your MRI demonstrated that you have completely torn your anterior talofibular ligament- this is more severe than a sprain and may require physical therapy to help recover Your Achilles tendon was inflamed but intact so no rupture or tear there.  Please let us know if you would like a referral to Orthopedics and physical therapy for your ankle injury.

## 2022-11-22 ENCOUNTER — Ambulatory Visit: Payer: Managed Care, Other (non HMO) | Admitting: Orthopaedic Surgery

## 2022-11-22 DIAGNOSIS — K219 Gastro-esophageal reflux disease without esophagitis: Secondary | ICD-10-CM | POA: Insufficient documentation

## 2022-11-22 NOTE — Progress Notes (Signed)
MRN : [id number]  Thomas Hopkins is a 37 y.o. ([birth year]) male who presents with chief complaint of legs hurt and swell.  History of Present Illness:   The patient returns to the office sooner than expected because of acute DVT.  He was on aspirin and Plavix. Holding Plavix for the past 5 days for endoscopy.  Because of left leg swelling which brought him into the emergency department on November 08, 2022. Currently being worked up for an Achilles tendon injury with MRI. DVT ultrasound concerning for acute DVT to the left femoral and left popliteal vein. Lab work reassuring with normal creatinine. Patient was given treatment dose of Lovenox. Given Eliquis starter pack.   There have been no further episodes of arterial embolization.  There have been no interval changes in lower extremity arterial related symptoms. No interval shortening of the patient's claudication distance or development of rest pain symptoms. No new ulcers or wounds have occurred since the last visit.  No new symptoms to suggest he has had another embolism.   Interim he has had problems with GERD as well as carpal tunnel of the left wrist   Historically:  He is s/p right leg thromboembolectomy of the tibial vessels and angioplaty of the PT on 11/18/2016.  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   There have been no significant changes to the patient's overall health care.     The patient denies amaurosis fugax or recent TIA symptoms. There are no recent neurological changes noted. The patient has history of DVT but no PE or superficial thrombophlebitis.   Previous CT angiogram dated 06/13/2018 shows resolution of the distal aortic thrombus with preservation of the iliofemoral and popliteal system.   CT angiogram of the chest dated 12/18/2021 is reviewed by me the aorta is imaged from the aortic valve to the level of the mesenteric takeoffs.  Aorta remains completely normal  no evidence of thrombus formation ulceration or other abnormalities.  Origins of the great vessels of the arch are all widely patent as well.  No evidence for pulmonary embolism.   ABI's Rt=1.12 (triphasic) and Lt=1.16 (triphasic)  (previous ABI's Rt=1.21 (triphasic) and Lt=1.05 (triphasic))    No outpatient medications have been marked as taking for the 11/24/22 encounter (Appointment) with Gilda Crease, Latina Craver, MD.    Past Medical History:  Diagnosis Date   DVT of lower extremity, bilateral (HCC)    stopped Eliquis in 2017   GERD (gastroesophageal reflux disease)    Hyperlipidemia     Past Surgical History:  Procedure Laterality Date   ABDOMINAL AORTOGRAM W/LOWER EXTREMITY Right 11/18/2016   Procedure: ABDOMINAL AORTOGRAM W/LOWER EXTREMITY;  Surgeon: Renford Dills, MD;  Location: [facility];  Service: Cardiovascular;  Laterality: Right;   APPENDECTOMY     LAPAROSCOPIC APPENDECTOMY N/A 10/14/2014   Procedure: APPENDECTOMY LAPAROSCOPIC;  Surgeon: Lattie Haw, MD;  Location: [facility];  Service: General;  Laterality: N/A;   TONSILLECTOMY     WISDOM TOOTH EXTRACTION      Social History Social History   Tobacco Use   Smoking status: Every Day    Current packs/day: 0.25    Average packs/day: 0.3 packs/day for 15.0 years (3.8 ttl pk-yrs)    Types: Cigarettes   Smokeless tobacco: Never  Vaping Use   Vaping status: Never Used  Substance Use Topics   Alcohol use: Yes    Comment: very  occasionally   Drug use: No    Family History Family History  Problem Relation Age of Onset   Diabetes Mother    Rectal cancer Father    Scoliosis Brother    Uterine cancer Maternal Aunt    Diabetes Mellitus II Maternal Grandmother    Hypertension Maternal Grandmother    Heart disease Maternal Grandfather     Allergies  Allergen Reactions   Bee Venom Hives   Morphine Nausea Only   Vicodin [Hydrocodone-Acetaminophen] Nausea And Vomiting    Headache    Hydrocodone-Acetaminophen Nausea And Vomiting    Headache   Mushroom Extract Complex Nausea And Vomiting   Olive Oil Nausea And Vomiting     REVIEW OF SYSTEMS (Negative unless checked)  Constitutional: [] Weight loss  [] Fever  [] Chills Cardiac: [] Chest pain   [] Chest pressure   [] Palpitations   [] Shortness of breath when laying flat   [] Shortness of breath with exertion. Vascular:  [] Pain in legs with walking   [x] Pain in legs at rest  [] History of DVT   [] Phlebitis   [x] Swelling in legs   [] Varicose veins   [] Non-healing ulcers Pulmonary:   [] Uses home oxygen   [] Productive cough   [] Hemoptysis   [] Wheeze  [] COPD   [] Asthma Neurologic:  [] Dizziness   [] Seizures   [] History of stroke   [] History of TIA  [] Aphasia   [] Vissual changes   [] Weakness or numbness in arm   [] Weakness or numbness in leg Musculoskeletal:   [] Joint swelling   [] Joint pain   [] Low back pain Hematologic:  [] Easy bruising  [] Easy bleeding   [] Hypercoagulable state   [] Anemic Gastrointestinal:  [] Diarrhea   [] Vomiting  [x] Gastroesophageal reflux/heartburn   [] Difficulty swallowing. Genitourinary:  [] Chronic kidney disease   [] Difficult urination  [] Frequent urination   [] Blood in urine Skin:  [] Rashes   [] Ulcers  Psychological:  [] History of anxiety   []  History of major depression.  Physical Examination  There were no vitals filed for this visit. There is no height or weight on file to calculate BMI. Gen: WD/WN, NAD Head: [place]/AT, No temporalis wasting.  Ear/Nose/Throat: Hearing grossly intact, nares w/o erythema or drainage, pinna without lesions Eyes: PER, EOMI, sclera nonicteric.  Neck: Supple, no gross masses.  No JVD.  Pulmonary:  Good air movement, no audible wheezing, no use of accessory muscles.  Cardiac: RRR, precordium not hyperdynamic. Vascular:  scattered varicosities present bilaterally.  Moderate venous stasis changes to the legs bilaterally.  3+ soft pitting edema left leg > right. CEAP [id number]    Vessel Right Left  Radial Palpable Palpable  Gastrointestinal: soft, non-distended. No guarding/no peritoneal signs.  Musculoskeletal: M/S 5/5 throughout.  No deformity.  Neurologic: CN 2-12 intact. Pain and light touch intact in extremities.  Symmetrical.  Speech is fluent. Motor exam as listed above. Psychiatric: Judgment intact, Mood & affect appropriate for pt's clinical situation. Dermatologic: Venous rashes no ulcers noted.  No changes consistent with cellulitis. Lymph : No lichenification or skin changes of chronic lymphedema.  CBC Lab Results  Component Value Date   WBC 12.9 (H) 11/08/2022   HGB 14.0 11/08/2022   HCT 42.8 11/08/2022   MCV 89.9 11/08/2022   PLT 236 11/08/2022    BMET    Component Value Date/Time   NA 136 11/08/2022 2225   NA 137 08/24/2022 1009   K 3.7 11/08/2022 2225   CL 103 11/08/2022 2225   CO2 24 11/08/2022 2225   GLUCOSE 92 11/08/2022 2225   BUN 9 11/08/2022 2225  BUN 10 08/24/2022 1009   CREATININE 0.72 11/08/2022 2225   CALCIUM 8.3 (L) 11/08/2022 2225   GFRNONAA >60 11/08/2022 2225   GFRAA >60 11/28/2017 0834   Estimated Creatinine Clearance: 171.3 mL/min (by C-G formula based on SCr of 0.72 mg/dL).  COAG Lab Results  Component Value Date   INR 1.05 11/18/2016   INR 1.23 10/23/2014    Radiology MR ANKLE LEFT WO CONTRAST  Result Date: 11/20/2022 CLINICAL DATA:  Left foot and ankle pain EXAM: MRI OF THE LEFT ANKLE WITHOUT CONTRAST TECHNIQUE: Multiplanar, multisequence MR imaging of the ankle was performed. No intravenous contrast was administered. COMPARISON:  None Available. FINDINGS: TENDONS Peroneal: Peroneal longus tendon intact. Mild tendinosis of the peroneus brevis. Posteromedial: Posterior tibial tendon intact. Flexor hallucis longus tendon intact. Flexor digitorum longus tendon intact. Anterior: Tibialis anterior tendon intact. Extensor hallucis longus tendon intact Extensor digitorum longus tendon intact. Achilles:  Mild  tendinosis of the Achilles tendon without a tear. Plantar Fascia: Intact. LIGAMENTS Lateral: Complete tear of the anterior talofibular ligament. Complete tear of the calcaneofibular ligament. Posterior talofibular ligament intact. Anterior and posterior tibiofibular ligaments intact. Medial: Deltoid ligament intact. Spring ligament intact. CARTILAGE Ankle Joint: No joint effusion. Normal ankle mortise. No chondral defect. Subtalar Joints/Sinus Tarsi: Normal subtalar joints. No subtalar joint effusion. Normal sinus tarsi. Bones: No aggressive osseous lesion. No fracture or dislocation. Soft Tissue: No fluid collection or hematoma. Muscles are normal without edema or atrophy. Tarsal tunnel is normal. IMPRESSION: 1. Complete tear of the anterior talofibular ligament and calcaneofibular ligament. 2. Mild tendinosis of the peroneus brevis. 3. Mild tendinosis of the Achilles tendon without a tear. Electronically Signed   By: Elige Ko M.D.   On: 11/20/2022 13:39   US Venous Img Lower  Left (DVT Study)  Result Date: 11/08/2022 CLINICAL DATA:  Left lower extremity swelling EXAM: LEFT LOWER EXTREMITY VENOUS DOPPLER ULTRASOUND TECHNIQUE: Gray-scale sonography with graded compression, as well as color Doppler and duplex ultrasound were performed to evaluate the lower extremity deep venous systems from the level of the common femoral vein and including the common femoral, femoral, profunda femoral, popliteal and calf veins including the posterior tibial, peroneal and gastrocnemius veins when visible. The superficial great saphenous vein was also interrogated. Spectral Doppler was utilized to evaluate flow at rest and with distal augmentation maneuvers in the common femoral, femoral and popliteal veins. COMPARISON:  02/19/2022 FINDINGS: Contralateral Common Femoral Vein: Respiratory phasicity is normal and symmetric with the symptomatic side. No evidence of thrombus. Normal compressibility. Common Femoral Vein: No  evidence of thrombus. Normal compressibility, respiratory phasicity and response to augmentation. Saphenofemoral Junction: No evidence of thrombus. Normal compressibility and flow on color Doppler imaging. Profunda Femoral Vein: No evidence of thrombus. Normal compressibility and flow on color Doppler imaging. Femoral Vein: There is occlusive thrombus throughout the left femoral vein. The vessel is noncompressible. No color flow is detected. Popliteal Vein: There is occlusive thrombus throughout the left popliteal vein. The vessel is noncompressible. No color flow is detected. Calf Veins: No evidence of thrombus. Normal compressibility and flow on color Doppler imaging. Superficial Great Saphenous Vein: No evidence of thrombus. Normal compressibility. Other Findings:  None. IMPRESSION: 1. Occlusive DVT within the left femoral and left popliteal vein. These results will be called to the ordering clinician or representative by the Radiologist Assistant, and communication documented in the PACS or [employer]. Electronically Signed   By: Sharlet Salina M.D.   On: 11/08/2022 20:54   DG Ankle Complete  Left  Result Date: 10/24/2022 CLINICAL DATA:  Lateral ankle pain, recent injury, swelling EXAM: LEFT ANKLE COMPLETE - 3+ VIEW COMPARISON:  10/24/2022 FINDINGS: Diffuse lower leg and ankle soft tissue swelling. No acute osseous finding, fracture or malalignment. No large joint effusion. No joint abnormality. IMPRESSION: Soft tissue swelling. No acute osseous finding. Electronically Signed   By: Judie Petit.  Shick M.D.   On: 10/24/2022 15:59   DG Foot Complete Left  Result Date: 10/24/2022 CLINICAL DATA:  Fall, pain EXAM: LEFT FOOT - COMPLETE 3+ VIEW COMPARISON:  None Available. FINDINGS: There is no evidence of fracture or dislocation. There is no evidence of arthropathy or other focal bone abnormality. Diffuse ankle and foot soft tissue swelling noted. IMPRESSION: Soft tissue swelling. No acute osseous finding.  Electronically Signed   By: Judie Petit.  Shick M.D.   On: 10/24/2022 15:41     Assessment/Plan 1. Acute venous embolism and thrombosis of deep vessels of distal end of right lower extremity (HCC) Recommend:   No surgery or intervention at this point in time.  IVC filter is not indicated at present.  Patient's recent duplex ultrasound of the venous system shows DVT from the popliteal to the femoral veins.  The patient is initiated on anticoagulation and will now be on anticoagulation for life.  Elevation was stressed, use of a recliner was discussed.  I have had a long discussion with the patient regarding DVT and post phlebitic changes such as swelling and why it  causes symptoms such as pain.  The patient will wear graduated compression stockings class 1 (20-30 mmHg), beginning after three full days of anticoagulation, on a daily basis a prescription was given. The patient will  beginning wearing the stockings first thing in the morning and removing them in the evening. The patient is instructed specifically not to sleep in the stockings.  In addition, behavioral modification including elevation during the day and avoidance of prolonged dependency will be initiated.    The patient will continue anticoagulation for now as there have not been any problems or complications from his anticoagulation therapy at this point.   - VAS [place] LOWER EXTREMITY VENOUS (DVT); Future  2. Chronic venous insufficiency Recommend:  I have had a long discussion with the patient regarding swelling and why it  causes symptoms.  Patient will begin wearing graduated compression on a daily basis a prescription was given. The patient will  wear the stockings first thing in the morning and removing them in the evening. The patient is instructed specifically not to sleep in the stockings.   In addition, behavioral modification will be initiated.  This will include frequent elevation, use of over the counter pain medications and  exercise such as walking.  Consideration for a lymph pump will also be made based upon the effectiveness of conservative therapy.  This would help to improve the edema control and prevent sequela such as ulcers and infections   Patient should undergo duplex ultrasound of the venous system to ensure that DVT or reflux is not present.  The patient will follow-up with me after the ultrasound.     3. Lymphedema Recommend:  I have had a long discussion with the patient regarding swelling and why it  causes symptoms.  Patient will begin wearing graduated compression on a daily basis a prescription was given. The patient will  wear the stockings first thing in the morning and removing them in the evening. The patient is instructed specifically not to sleep in the stockings.  In addition, behavioral modification will be initiated.  This will include frequent elevation, use of over the counter pain medications and exercise such as walking.  Consideration for a lymph pump will also be made based upon the effectiveness of conservative therapy.  This would help to improve the edema control and prevent sequela such as ulcers and infections   Patient should undergo duplex ultrasound of the venous system to ensure that DVT or reflux is not present.  The patient will follow-up with me after the ultrasound.   4. 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  The patient does not voice lifestyle limiting changes at this point in time.   Noninvasive studies do not suggest clinically significant change.   No invasive studies, angiography or surgery at this time.  I did discuss that in two years I believe that a CT angiogram would be appropriate to more thoroughly evaluate the aorta and iliac arteries.   The patient should continue walking and begin a more formal exercise program.   The patient should continue antiplatelet therapy and aggressive treatment of the lipid abnormalities   No changes in the patient's medications at this time   The patient should continue wearing graduated compression socks 10-15 mmHg strength to control the mild edema.   5. Mixed hyperlipidemia Continue statin as ordered and reviewed, no changes at this time  6. Gastroesophageal reflux disease without esophagitis Continue PPI as already ordered, this medication has been reviewed and there are no changes at this time.  Avoidence of caffeine and alcohol  Moderate elevation of the head of the bed      Levora Dredge, MD  11/22/2022 8:47 AM

## 2022-11-24 ENCOUNTER — Encounter (INDEPENDENT_AMBULATORY_CARE_PROVIDER_SITE_OTHER): Payer: Self-pay | Admitting: Vascular Surgery

## 2022-11-24 ENCOUNTER — Encounter (INDEPENDENT_AMBULATORY_CARE_PROVIDER_SITE_OTHER): Payer: Self-pay | Admitting: Nurse Practitioner

## 2022-11-24 ENCOUNTER — Ambulatory Visit (INDEPENDENT_AMBULATORY_CARE_PROVIDER_SITE_OTHER): Payer: 59 | Admitting: Vascular Surgery

## 2022-11-24 VITALS — BP 128/75 | HR 87 | Resp 16 | Wt 282.4 lb

## 2022-11-24 DIAGNOSIS — I890 Lymphedema, not elsewhere classified: Secondary | ICD-10-CM

## 2022-11-24 DIAGNOSIS — I872 Venous insufficiency (chronic) (peripheral): Secondary | ICD-10-CM | POA: Diagnosis not present

## 2022-11-24 DIAGNOSIS — K219 Gastro-esophageal reflux disease without esophagitis: Secondary | ICD-10-CM

## 2022-11-24 DIAGNOSIS — I743 Embolism and thrombosis of arteries of the lower extremities: Secondary | ICD-10-CM

## 2022-11-24 DIAGNOSIS — I824Z1 Acute embolism and thrombosis of unspecified deep veins of right distal lower extremity: Secondary | ICD-10-CM

## 2022-11-24 DIAGNOSIS — E782 Mixed hyperlipidemia: Secondary | ICD-10-CM

## 2022-11-26 ENCOUNTER — Encounter (INDEPENDENT_AMBULATORY_CARE_PROVIDER_SITE_OTHER): Payer: Self-pay | Admitting: Vascular Surgery

## 2022-11-26 DIAGNOSIS — I872 Venous insufficiency (chronic) (peripheral): Secondary | ICD-10-CM | POA: Insufficient documentation

## 2022-11-26 DIAGNOSIS — I890 Lymphedema, not elsewhere classified: Secondary | ICD-10-CM | POA: Insufficient documentation

## 2022-12-12 ENCOUNTER — Emergency Department
Admission: EM | Admit: 2022-12-12 | Discharge: 2022-12-12 | Disposition: A | Discharge status: Home / Self Care | Payer: Managed Care, Other (non HMO) | Attending: Emergency Medicine | Admitting: Emergency Medicine

## 2022-12-12 ENCOUNTER — Encounter: Payer: Self-pay | Admitting: Emergency Medicine

## 2022-12-12 ENCOUNTER — Other Ambulatory Visit: Payer: Self-pay

## 2022-12-12 ENCOUNTER — Emergency Department: Payer: Managed Care, Other (non HMO)

## 2022-12-12 DIAGNOSIS — M79605 Pain in left leg: Secondary | ICD-10-CM | POA: Diagnosis present

## 2022-12-12 LAB — CBC
HCT: 43.8 % (ref 39.0–52.0)
Hemoglobin: 14.1 g/dL (ref 13.0–17.0)
MCH: 29.3 pg (ref 26.0–34.0)
MCHC: 32.2 g/dL (ref 30.0–36.0)
MCV: 90.9 fL (ref 80.0–100.0)
Platelets: 231 10*3/uL (ref 150–400)
RBC: 4.82 MIL/uL (ref 4.22–5.81)
RDW: 13.5 % (ref 11.5–15.5)
WBC: 6.8 10*3/uL (ref 4.0–10.5)
nRBC: 0 % (ref 0.0–0.2)

## 2022-12-12 LAB — COMPREHENSIVE METABOLIC PANEL
ALT: 27 U/L (ref 0–44)
AST: 19 U/L (ref 15–41)
Albumin: 3.7 g/dL (ref 3.5–5.0)
Alkaline Phosphatase: 57 U/L (ref 38–126)
Anion gap: 7 (ref 5–15)
BUN: 13 mg/dL (ref 6–20)
CO2: 23 mmol/L (ref 22–32)
Calcium: 8.6 mg/dL — ABNORMAL LOW (ref 8.9–10.3)
Chloride: 108 mmol/L (ref 98–111)
Creatinine, Ser: 0.68 mg/dL (ref 0.61–1.24)
GFR, Estimated: >60 mL/min (ref >60)
Glucose, Bld: 155 mg/dL — ABNORMAL HIGH (ref 70–99)
Potassium: 3.6 mmol/L (ref 3.5–5.1)
Sodium: 138 mmol/L (ref 135–145)
Total Bilirubin: 0.5 mg/dL (ref 0.3–1.2)
Total Protein: 6.9 g/dL (ref 6.5–8.1)

## 2022-12-12 LAB — APTT: aPTT: 30 s (ref 24–36)

## 2022-12-12 NOTE — ED Triage Notes (Signed)
Pt is on Eliquis for hx of blood clots.

## 2022-12-12 NOTE — ED Triage Notes (Signed)
Pt here with left leg pain. Pt states pain is in his calf and his ankle. Pt states he injured his leg at work at month ago but started having swelling in the last week. Pt describes the pain as a soreness. Pt still able to ambulate.

## 2022-12-12 NOTE — ED Provider Notes (Signed)
[facility] Provider Note    Event Date/Time   First MD Initiated Contact with Patient 12/12/22 [phone]     (approximate)   History   Leg Pain   HPI  Thomas Hopkins is a 37 y.o. male   presents to the ED with complaint of left leg pain and swelling.  Patient reports an injury to his leg while at work approximately 1 month ago and since that time has began swelling.  He describes pain as a soreness but still is able to ambulate.  Patient also has a history of DVTs and saw Dr. Gilda Crease recently and started on Eliquis.  Prior to that he was on Plavix but discontinued due to a procedure.  In addition to history of DVTs patient has a history of GERD, lymphedema and hyperlipidemia.      Physical Exam   Triage Vital Signs: ED Triage Vitals [12/12/22 0847]  Encounter Vitals Group     BP 128/78     Systolic BP Percentile      Diastolic BP Percentile      Pulse Rate (!) 57     Resp 17     Temp 98.3 F (36.8 C)     Temp Source Oral     SpO2 98 %     Weight 282 lb 6.6 oz (128.1 kg)     Height 5\' 8"  (1.727 m)     Head Circumference      Peak Flow      Pain Score 3     Pain Loc      Pain Education      Exclude from Growth Chart     Most recent vital signs: Vitals:   12/12/22 0847  BP: 128/78  Pulse: (!) 57  Resp: 17  Temp: 98.3 F (36.8 C)  SpO2: 98%     General: Awake, no distress.  CV:  Good peripheral perfusion.  Resp:  Normal effort.  Abd:  No distention.  Other:  Left lower extremity is edematous without pitting edema.  Skin is intact.  No erythema.  Patient is able to stand and ambulate without any assistance.  Pulses are present distally both DP and PT.  Pulses were confirmed by Doppler also.   ED Results / Procedures / Treatments   Labs (all labs ordered are listed, but only abnormal results are displayed) Labs Reviewed  COMPREHENSIVE METABOLIC PANEL - Abnormal; Notable for the following components:      Result Value    Glucose, Bld 155 (*)    Calcium 8.6 (*)    All other components within normal limits  CBC  APTT     RADIOLOGY Ultrasound venous left lower extremity per radiologist is unchanged from previous ultrasound and compared with 11/08/2022.  Grossly unchanged occlusive DVT extending from the proximal aspect of the left femoral vein through the left popliteal vein as noted prior.    PROCEDURES:  Critical Care performed:   Procedures   MEDICATIONS ORDERED IN ED: Medications - No data to display   IMPRESSION / MDM / ASSESSMENT AND PLAN / ED COURSE  I reviewed the triage vital signs and the nursing notes.   Differential diagnosis includes, but is not limited to, left lower extremity cellulitis, DVT worsening, known DVT.  37 year old male presents to the ED with complaint of left lower extremity edema that worsened after working over the weekend.  Patient denies any severe worsening of his pain and is more concerned about edema.  Patient  did see Dr. Gilda Crease on 11/24/2022 and was started on Eliquis prior to his office visit.  Patient is encouraged to elevate to reduce swelling and continue Eliquis.  Also he is to keep his follow-up appointment with Dr. Gilda Crease as scheduled or call if any continued concerns.      Patient's presentation is most consistent with acute illness / injury with system symptoms.  FINAL CLINICAL IMPRESSION(S) / ED DIAGNOSES   Final diagnoses:  Left leg pain     Rx / DC Orders   ED Discharge Orders     None        Note:  This document was prepared using Dragon voice recognition software and may include unintentional dictation errors.   Tommi Rumps, PA-C 12/12/22 1500    Minna Antis, MD 12/14/22 [phone]

## 2022-12-12 NOTE — Discharge Instructions (Addendum)
Follow-up your primary care provider.  Elevate your leg frequently to reduce swelling. Continue taking Eliquis as directed.

## 2022-12-26 NOTE — Progress Notes (Unsigned)
   There were no vitals taken for this visit.   Subjective:    Patient ID: Thomas Hopkins, male    DOB: 10-19-1985, 37 y.o.   MRN: [id number]  HPI: Thomas Hopkins is a 37 y.o. male  No chief complaint on file.   Relevant past medical, surgical, family and social history reviewed and updated as indicated. Interim medical history since our last visit reviewed. Allergies and medications reviewed and updated.  Review of Systems  Per HPI unless specifically indicated above     Objective:    There were no vitals taken for this visit.  Wt Readings from Last 3 Encounters:  12/12/22 282 lb 6.6 oz (128.1 kg)  11/24/22 282 lb 6.4 oz (128.1 kg)  11/11/22 297 lb (134.7 kg)    Physical Exam  Results for orders placed or performed during the hospital encounter of 12/12/22  CBC  Result Value Ref Range   WBC 6.8 4.0 - 10.5 K/uL   RBC 4.82 4.22 - 5.81 MIL/uL   Hemoglobin 14.1 13.0 - 17.0 g/dL   HCT 27.2 53.6 - 64.4 %   MCV 90.9 80.0 - 100.0 fL   MCH 29.3 26.0 - 34.0 pg   MCHC 32.2 30.0 - 36.0 g/dL   RDW 03.4 74.2 - 59.5 %   Platelets 231 150 - 400 K/uL   nRBC 0.0 0.0 - 0.2 %  Comprehensive metabolic panel  Result Value Ref Range   Sodium 138 135 - 145 mmol/L   Potassium 3.6 3.5 - 5.1 mmol/L   Chloride 108 98 - 111 mmol/L   CO2 23 22 - 32 mmol/L   Glucose, Bld 155 (H) 70 - 99 mg/dL   BUN 13 6 - 20 mg/dL   Creatinine, Ser 6.38 0.61 - 1.24 mg/dL   Calcium 8.6 (L) 8.9 - 10.3 mg/dL   Total Protein 6.9 6.5 - 8.1 g/dL   Albumin 3.7 3.5 - 5.0 g/dL   AST 19 15 - 41 U/L   ALT 27 0 - 44 U/L   Alkaline Phosphatase 57 38 - 126 U/L   Total Bilirubin 0.5 0.3 - 1.2 mg/dL   GFR, Estimated >75 >64 mL/min   Anion gap 7 5 - 15  APTT  Result Value Ref Range   aPTT 30 24 - 36 seconds      Assessment & Plan:   Problem List Items Addressed This Visit   None    Follow up plan: No follow-ups on file.

## 2022-12-27 ENCOUNTER — Encounter: Payer: Self-pay | Admitting: Nurse Practitioner

## 2022-12-27 ENCOUNTER — Ambulatory Visit (INDEPENDENT_AMBULATORY_CARE_PROVIDER_SITE_OTHER): Payer: Managed Care, Other (non HMO) | Admitting: Nurse Practitioner

## 2022-12-27 VITALS — BP 110/74 | HR 54 | Temp 97.7°F | Wt 289.2 lb

## 2022-12-27 DIAGNOSIS — I82439 Acute embolism and thrombosis of unspecified popliteal vein: Secondary | ICD-10-CM

## 2022-12-27 DIAGNOSIS — M546 Pain in thoracic spine: Secondary | ICD-10-CM

## 2022-12-27 DIAGNOSIS — R7309 Other abnormal glucose: Secondary | ICD-10-CM | POA: Diagnosis not present

## 2022-12-27 NOTE — Assessment & Plan Note (Signed)
Chronic. Ongoing pain.  Recommend following up with [facility].

## 2022-12-27 NOTE — Assessment & Plan Note (Signed)
Ongoing concern.  Continue with Eliquis.  Encouraged elevation of legs and getting compression stockings.  Keep follow up with [facility] in December.

## 2022-12-28 LAB — HEMOGLOBIN A1C
Est. average glucose Bld gHb Est-mCnc: 108 mg/dL
Hgb A1c MFr Bld: 5.4 % (ref 4.8–5.6)

## 2022-12-28 NOTE — Progress Notes (Signed)
Hi Azure.  Your A1c is in the normal range.  No concerns at this time.

## 2022-12-29 ENCOUNTER — Other Ambulatory Visit: Payer: Self-pay | Admitting: Nurse Practitioner

## 2022-12-29 MED ORDER — APIXABAN 5 MG PO TABS: 5.0000 mg | ORAL_TABLET | Freq: Two times a day (BID) | ORAL | 1 refills | Status: DC

## 2022-12-29 NOTE — Telephone Encounter (Signed)
Medication Refill - Medication:  apixaban (ELIQUIS) 5 MG TABS tablet   *Patient states he was switched to Plavix to Eliquis and is almost out of medication Eliquis (he said he was only given a 30 day to supply, a starter pack of Eliquis), call got disconnected, left VM letting patient know I sent refill request.  Has the patient contacted their pharmacy? Yes, advised to contact PCP  Preferred Pharmacy (with phone number or street name):  [id number] - [place], [place] - [address] [facility] [address] [facility]  Phone: [phone] Fax: [phone]   Has the patient been seen for an appointment in the last year OR does the patient have an upcoming appointment? Yes.

## 2022-12-29 NOTE — Telephone Encounter (Signed)
Requested medications are due for refill today.  Unsure  Requested medications are on the active medications list.  yes  Last refill. 12/27/2022   Future visit scheduled.   yes  Notes to clinic.  Medication is historical - refilled 12/27/2022    Requested Prescriptions  Pending Prescriptions Disp Refills   apixaban (ELIQUIS) 5 MG TABS tablet 60 tablet     Sig: Take 1 tablet (5 mg total) by mouth 2 (two) times daily.     Hematology:  Anticoagulants - apixaban Passed - 12/29/2022  3:24 PM      Passed - PLT in normal range and within 360 days    Platelets  Date Value Ref Range Status  12/12/2022 231 150 - 400 K/uL Final  08/24/2022 276 150 - 450 x10E3/uL Final         Passed - HGB in normal range and within 360 days    Hemoglobin  Date Value Ref Range Status  12/12/2022 14.1 13.0 - 17.0 g/dL Final  16/12/9602 54.0 13.0 - 17.7 g/dL Final         Passed - HCT in normal range and within 360 days    HCT  Date Value Ref Range Status  12/12/2022 43.8 39.0 - 52.0 % Final   Hematocrit  Date Value Ref Range Status  08/24/2022 46.6 37.5 - 51.0 % Final         Passed - Cr in normal range and within 360 days    Creatinine, Ser  Date Value Ref Range Status  12/12/2022 0.68 0.61 - 1.24 mg/dL Final         Passed - AST in normal range and within 360 days    AST  Date Value Ref Range Status  12/12/2022 19 15 - 41 U/L Final         Passed - ALT in normal range and within 360 days    ALT  Date Value Ref Range Status  12/12/2022 27 0 - 44 U/L Final         Passed - Valid encounter within last 12 months    Recent Outpatient Visits           2 days ago Deep vein thrombosis (DVT) of popliteal vein, unspecified chronicity, unspecified laterality (HCC)   [place] [facility] Larae Grooms, NP   2 months ago Sprain of left ankle, unspecified ligament, subsequent encounter   [facility] Mecum, Oswaldo Conroy, PA-C   4 months ago Annual  physical exam   [facility] Larae Grooms, NP   7 months ago Morbid obesity [facility])   [place] [facility] Larae Grooms, NP       Future Appointments             In 1 month Larae Grooms, NP [facility]

## 2023-02-15 ENCOUNTER — Ambulatory Visit (INDEPENDENT_AMBULATORY_CARE_PROVIDER_SITE_OTHER): Payer: 59

## 2023-02-15 ENCOUNTER — Encounter (INDEPENDENT_AMBULATORY_CARE_PROVIDER_SITE_OTHER): Payer: Self-pay | Admitting: Nurse Practitioner

## 2023-02-15 ENCOUNTER — Ambulatory Visit (INDEPENDENT_AMBULATORY_CARE_PROVIDER_SITE_OTHER): Payer: 59 | Admitting: Nurse Practitioner

## 2023-02-15 VITALS — BP 119/68 | HR 60 | Resp 16 | Wt 295.2 lb

## 2023-02-15 DIAGNOSIS — I890 Lymphedema, not elsewhere classified: Secondary | ICD-10-CM

## 2023-02-15 DIAGNOSIS — I824Z2 Acute embolism and thrombosis of unspecified deep veins of left distal lower extremity: Secondary | ICD-10-CM | POA: Diagnosis not present

## 2023-02-15 DIAGNOSIS — I824Z1 Acute embolism and thrombosis of unspecified deep veins of right distal lower extremity: Secondary | ICD-10-CM | POA: Diagnosis not present

## 2023-02-15 DIAGNOSIS — E782 Mixed hyperlipidemia: Secondary | ICD-10-CM | POA: Diagnosis not present

## 2023-02-15 DIAGNOSIS — I743 Embolism and thrombosis of arteries of the lower extremities: Secondary | ICD-10-CM | POA: Diagnosis not present

## 2023-02-19 ENCOUNTER — Encounter (INDEPENDENT_AMBULATORY_CARE_PROVIDER_SITE_OTHER): Payer: Self-pay | Admitting: Nurse Practitioner

## 2023-02-19 NOTE — Progress Notes (Signed)
Subjective:    Patient ID: Thomas Hopkins, male    DOB: 10-03-1985, 37 y.o.   MRN: [id number] Chief Complaint  Patient presents with   Follow-up    Ultrasound follow up    The patient returns to the office sooner than expected because of acute DVT.  He was on aspirin and Plavix. Holding Plavix for the past 5 days for endoscopy.  Because of left leg swelling which brought him into the emergency department on November 08, 2022. Currently being worked up for an Achilles tendon injury with MRI. DVT ultrasound concerning for acute DVT to the left femoral and left popliteal vein. Lab work reassuring with normal creatinine. Patient was given treatment dose of Lovenox. Given Eliquis starter pack.  He has has been doing well on Eliquis   There have been no further episodes of arterial embolization.  There have been no interval changes in lower extremity arterial related symptoms. No interval shortening of the patient's claudication distance or development of rest pain symptoms. No new ulcers or wounds have occurred since the last visit.  No new symptoms to suggest he has had another embolism.   Interim he has had problems with GERD as well as carpal tunnel of the left wrist   Historically:  He is s/p right leg thromboembolectomy of the tibial vessels and angioplaty of the PT on 11/18/2016.  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   There have been no significant changes to the patient's overall health care.     The patient denies amaurosis fugax or recent TIA symptoms. There are no recent neurological changes noted. The patient has history of DVT but no PE or superficial thrombophlebitis.   Previous CT angiogram dated 06/13/2018 shows resolution of the distal aortic thrombus with preservation of the iliofemoral and popliteal system.   CT angiogram of the chest dated 12/18/2021 is reviewed by me the aorta is imaged from the aortic valve to the level of the  mesenteric takeoffs.  Aorta remains completely normal no evidence of thrombus formation ulceration or other abnormalities.  Origins of the great vessels of the arch are all widely patent as well.  No evidence for pulmonary embolism.   ABI's Rt=1.29(triphasic) and Lt=1.19 (triphasic)  (previous ABI's Rt=1.12 (triphasic) and Lt=1.16 (triphasic))  Today left lower extremity showed no evidence of DVT however it is chronic at this time and results are improved from previous studies       Review of Systems  Cardiovascular:  Positive for leg swelling.  All other systems reviewed and are negative.      Objective:   Physical Exam Vitals reviewed.  HENT:     Head: Normocephalic.  Cardiovascular:     Rate and Rhythm: Normal rate.  Pulmonary:     Effort: Pulmonary effort is normal.  Musculoskeletal:     Left lower leg: Edema present.  Skin:    General: Skin is warm and dry.  Neurological:     Mental Status: He is alert and oriented to person, place, and time.  Psychiatric:        Mood and Affect: Mood normal.        Behavior: Behavior normal.        Thought Content: Thought content normal.        Judgment: Judgment normal.     BP 119/68 (BP Location: Right Arm)   Pulse 60   Resp 16   Wt 295 lb 3.2 oz (  133.9 kg)   BMI 44.89 kg/m   Past Medical History:  Diagnosis Date   DVT of lower extremity, bilateral (HCC)    stopped Eliquis in 2017   GERD (gastroesophageal reflux disease)    Hyperlipidemia     Social History   Socioeconomic History   Marital status: Single    Spouse name: Not on file   Number of children: Not on file   Years of education: Not on file   Highest education level: Not on file  Occupational History   Not on file  Tobacco Use   Smoking status: Every Day    Current packs/day: 0.25    Average packs/day: 0.3 packs/day for 15.0 years (3.8 ttl pk-yrs)    Types: Cigarettes   Smokeless tobacco: Never  Vaping Use   Vaping status: Never Used  Substance  and Sexual Activity   Alcohol use: Yes    Comment: very occasionally   Drug use: No   Sexual activity: Not Currently  Other Topics Concern   Not on file  Social History Narrative   Not on file   Social Determinants of Health   Financial Resource Strain: Not on file  Food Insecurity: No Food Insecurity (11/11/2022)   Hunger Vital Sign    Worried About Running Out of Food in the Last Year: Never true    Ran Out of Food in the Last Year: Never true  Transportation Needs: No Transportation Needs (11/11/2022)   PRAPARE - Administrator, Civil Service (Medical): No    Lack of Transportation (Non-Medical): No  Physical Activity: Not on file  Stress: Not on file  Social Connections: Not on file  Intimate Partner Violence: Not At Risk (11/11/2022)   Humiliation, Afraid, Rape, and Kick questionnaire    Fear of Current or Ex-Partner: No    Emotionally Abused: No    Physically Abused: No    Sexually Abused: No    Past Surgical History:  Procedure Laterality Date   ABDOMINAL AORTOGRAM W/LOWER EXTREMITY Right 11/18/2016   Procedure: ABDOMINAL AORTOGRAM W/LOWER EXTREMITY;  Surgeon: Renford Dills, MD;  Location: [facility] INVASIVE CV LAB;  Service: Cardiovascular;  Laterality: Right;   APPENDECTOMY     LAPAROSCOPIC APPENDECTOMY N/A 10/14/2014   Procedure: APPENDECTOMY LAPAROSCOPIC;  Surgeon: Lattie Haw, MD;  Location: [facility] ORS;  Service: General;  Laterality: N/A;   TONSILLECTOMY     WISDOM TOOTH EXTRACTION      Family History  Problem Relation Age of Onset   Diabetes Mother    Rectal cancer Father    Scoliosis Brother    Uterine cancer Maternal Aunt    Diabetes Mellitus II Maternal Grandmother    Hypertension Maternal Grandmother    Heart disease Maternal Grandfather     Allergies  Allergen Reactions   Bee Venom Hives   Morphine Nausea Only   Vicodin [Hydrocodone-Acetaminophen] Nausea And Vomiting    Headache   Hydrocodone-Acetaminophen Nausea And Vomiting     Headache   Mushroom Extract Complex (Do Not Select) Nausea And Vomiting   Olive Oil Nausea And Vomiting       Latest Ref Rng & Units 12/12/2022    8:51 AM 11/08/2022   10:25 PM 08/24/2022   10:09 AM  CBC  WBC 4.0 - 10.5 K/uL 6.8  12.9  8.0   Hemoglobin 13.0 - 17.0 g/dL 10.6  26.9  48.5   Hematocrit 39.0 - 52.0 % 43.8  42.8  46.6   Platelets 150 - 400 K/uL  231  236  276       CMP     Component Value Date/Time   NA 138 12/12/2022 0851   NA 137 08/24/2022 1009   K 3.6 12/12/2022 0851   CL 108 12/12/2022 0851   CO2 23 12/12/2022 0851   GLUCOSE 155 (H) 12/12/2022 0851   BUN 13 12/12/2022 0851   BUN 10 08/24/2022 1009   CREATININE 0.68 12/12/2022 0851   CALCIUM 8.6 (L) 12/12/2022 0851   PROT 6.9 12/12/2022 0851   PROT 6.8 08/24/2022 1009   ALBUMIN 3.7 12/12/2022 0851   ALBUMIN 4.4 08/24/2022 1009   AST 19 12/12/2022 0851   ALT 27 12/12/2022 0851   ALKPHOS 57 12/12/2022 0851   BILITOT 0.5 12/12/2022 0851   BILITOT 0.3 08/24/2022 1009   EGFR 121 08/24/2022 1009   GFRNONAA >60 12/12/2022 0851     No results found.     Assessment & Plan:   1. Acute venous embolism and thrombosis of deep vessels of distal end of right lower extremity (HCC) Currently the patient still has evidence of DVT but it is improving from initial diagnosis.  Given that he is not only had DVTs of arterial emboli patient will continue to remain on Eliquis indefinitely.  2. Lymphedema Recommend:  No surgery or intervention at this point in time.   The Patient is CEAP [id number].  The patient has been wearing compression for more than 12 weeks with no or little benefit.  The patient has been exercising daily for more than 12 weeks. The patient has been elevating and taking OTC pain medications for more than 12 weeks.  None of these have have eliminated the pain related to the lymphedema or the discomfort regarding excessive swelling and venous congestion.    I have reviewed my discussion with the  patient regarding lymphedema and why it  causes symptoms.  Patient will continue wearing graduated compression on a daily basis. The patient should put the compression on first thing in the morning and removing them in the evening. The patient should not sleep in the compression.   In addition, behavioral modification throughout the day will be continued.  This will include frequent elevation (such as in a recliner), use of over the counter pain medications as needed and exercise such as walking.  The systemic causes for chronic edema such as liver, kidney and cardiac etiologies do not appear to have significant changed over the past year.    The patient has chronic , severe lymphedema with hyperpigmentation of the skin and has done MLD, skin care, medication, diet, exercise, elevation and compression for 4 weeks with no improvement,  I am recommending a lymphedema pump.  The patient still has stage 3 lymphedema and therefore, I believe that a lymph pump is needed to improve the control of the patient's lymphedema and improve the quality of life.  Additionally, a lymph pump is warranted because it will reduce the risk of cellulitis and ulceration in the future.  Patient should follow-up in six months   3. 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)  Recommend:  The patient has evidence of atherosclerosis of the lower extremities with claudication.  The patient does not voice lifestyle limiting changes at this point in time.  Noninvasive studies do not suggest clinically significant change.  No invasive studies, angiography or surgery at this time The patient should continue walking and begin a more formal exercise program.  The patient should continue antiplatelet therapy and aggressive  treatment of the lipid abnormalities  No changes in the patient's medications at this time  Continued surveillance is indicated as atherosclerosis is likely to progress with time.    The patient  will continue follow up with noninvasive studies as ordered.  Patient will follow-up with ABIs in 5 months  4. Mixed hyperlipidemia Continue statin as ordered and reviewed, no changes at this time   Current Outpatient Medications on File Prior to Visit  Medication Sig Dispense Refill   apixaban (ELIQUIS) 5 MG TABS tablet Take 1 tablet (5 mg total) by mouth 2 (two) times daily. 180 tablet 1   aspirin EC 81 MG tablet Take 81 mg by mouth daily. Swallow whole.     atorvastatin (LIPITOR) 40 MG tablet TAKE 1 TABLET BY MOUTH EVERY DAY AFTER 6PM 90 tablet 1   oxyCODONE-acetaminophen (PERCOCET) 5-325 MG tablet Take 1 tablet by mouth every 6 (six) hours as needed for severe pain. 20 tablet 0   omeprazole (PRILOSEC) 40 MG capsule Take 1 capsule (40 mg total) by mouth daily before breakfast. 30 capsule 3   No current facility-administered medications on file prior to visit.    There are no Patient Instructions on file for this visit. No follow-ups on file.   Georgiana Spinner, NP

## 2023-02-21 LAB — VAS US ABI WITH/WO TBI
Left ABI: 1.19
Right ABI: 1.29

## 2023-02-22 ENCOUNTER — Ambulatory Visit (INDEPENDENT_AMBULATORY_CARE_PROVIDER_SITE_OTHER): Payer: 59 | Admitting: Nurse Practitioner

## 2023-02-22 ENCOUNTER — Encounter (INDEPENDENT_AMBULATORY_CARE_PROVIDER_SITE_OTHER): Payer: 59

## 2023-02-23 ENCOUNTER — Encounter: Payer: Self-pay | Admitting: Nurse Practitioner

## 2023-02-23 ENCOUNTER — Ambulatory Visit (INDEPENDENT_AMBULATORY_CARE_PROVIDER_SITE_OTHER): Payer: Managed Care, Other (non HMO) | Admitting: Nurse Practitioner

## 2023-02-23 VITALS — BP 119/79 | HR 76 | Temp 98.2°F | Ht 68.0 in | Wt 294.6 lb

## 2023-02-23 DIAGNOSIS — I82439 Acute embolism and thrombosis of unspecified popliteal vein: Secondary | ICD-10-CM | POA: Diagnosis not present

## 2023-02-23 DIAGNOSIS — E782 Mixed hyperlipidemia: Secondary | ICD-10-CM | POA: Diagnosis not present

## 2023-02-23 DIAGNOSIS — I700 Atherosclerosis of aorta: Secondary | ICD-10-CM | POA: Diagnosis not present

## 2023-02-23 MED ORDER — ATORVASTATIN CALCIUM 40 MG PO TABS: ORAL_TABLET | ORAL | 1 refills | Status: DC

## 2023-02-23 NOTE — Assessment & Plan Note (Signed)
Recommended eating smaller high protein, low fat meals more frequently and exercising 30 mins a day 5 times a week with a goal of 10-15lb weight loss in the next 3 months.  

## 2023-02-23 NOTE — Progress Notes (Signed)
BP 119/79 (BP Location: Left Arm, Patient Position: Sitting, Cuff Size: Large)   Pulse 76   Temp 98.2 F (36.8 C) (Oral)   Ht 5\' 8"  (1.727 m)   Wt 294 lb 9.6 oz (133.6 kg)   SpO2 96%   BMI 44.79 kg/m    Subjective:    Patient ID: Thomas Hopkins, male    DOB: 09-07-1985, 37 y.o.   MRN: [id number]  HPI: Thomas Hopkins is a 37 y.o. male  Chief Complaint  Patient presents with   6 month follow up   Headache    Headaches are becoming more frequent, every other day, sometimes wake up with them    DVT Recently saw vascular.  DVT is improving with Eliquis.  Will remain on eliquis indefinitely.  This is not his first clot.    HYPERLIPIDEMIA Hyperlipidemia status: excellent compliance Satisfied with current treatment?  yes Side effects:  no Medication compliance: excellent compliance Past cholesterol meds: atorvastain (lipitor) Supplements: none Aspirin:  yes The ASCVD Risk score (Arnett DK, et al., 2019) failed to calculate for the following reasons:   The 2019 ASCVD risk score is only valid for ages 99 to 79 Chest pain:  no Coronary artery disease:  no Family history CAD:  no Family history early CAD:  no   Relevant past medical, surgical, family and social history reviewed and updated as indicated. Interim medical history since our last visit reviewed. Allergies and medications reviewed and updated.  Review of Systems  Cardiovascular:  Positive for leg swelling.  Musculoskeletal:  Positive for back pain.    Per HPI unless specifically indicated above     Objective:    BP 119/79 (BP Location: Left Arm, Patient Position: Sitting, Cuff Size: Large)   Pulse 76   Temp 98.2 F (36.8 C) (Oral)   Ht 5\' 8"  (1.727 m)   Wt 294 lb 9.6 oz (133.6 kg)   SpO2 96%   BMI 44.79 kg/m   Wt Readings from Last 3 Encounters:  02/23/23 294 lb 9.6 oz (133.6 kg)  02/15/23 295 lb 3.2 oz (133.9 kg)  12/27/22 289 lb 3.2 oz (131.2 kg)    Physical Exam Vitals and  nursing note reviewed.  Constitutional:      General: He is not in acute distress.    Appearance: Normal appearance. He is obese. He is not ill-appearing, toxic-appearing or diaphoretic.  HENT:     Head: Normocephalic.     Right Ear: External ear normal.     Left Ear: External ear normal.     Nose: Nose normal. No congestion or rhinorrhea.     Mouth/Throat:     Mouth: Mucous membranes are moist.  Eyes:     General:        Right eye: No discharge.        Left eye: No discharge.     Extraocular Movements: Extraocular movements intact.     Conjunctiva/sclera: Conjunctivae normal.     Pupils: Pupils are equal, round, and reactive to light.  Cardiovascular:     Rate and Rhythm: Normal rate and regular rhythm.     Heart sounds: No murmur heard. Pulmonary:     Effort: Pulmonary effort is normal. No respiratory distress.     Breath sounds: Normal breath sounds. No wheezing, rhonchi or rales.  Abdominal:     General: Abdomen is flat. Bowel sounds are normal.  Musculoskeletal:     Cervical back: Normal range of motion and neck supple.  Comments: Left calf is larger than right  Skin:    General: Skin is warm and dry.     Capillary Refill: Capillary refill takes less than 2 seconds.  Neurological:     General: No focal deficit present.     Mental Status: He is alert and oriented to person, place, and time.  Psychiatric:        Mood and Affect: Mood normal.        Behavior: Behavior normal.        Thought Content: Thought content normal.        Judgment: Judgment normal.     Results for orders placed or performed in visit on 02/15/23  VAS [place] ABI WITH/WO TBI   Collection Time: 02/15/23 10:28 AM  Result Value Ref Range   Right ABI 1.29    Left ABI 1.19       Assessment & Plan:   Problem List Items Addressed This Visit       Cardiovascular and Mediastinum   Deep venous thrombosis (HCC)   Ongoing concern.  Continue with Eliquis.  Encouraged elevation of legs and getting  compression stockings.  Keep follow up with Vascular in December.  Encouraged smoking cessation.  Patient agrees and would like to do it without help from medication.       Relevant Medications   atorvastatin (LIPITOR) 40 MG tablet   Other Relevant Orders   Comp Met (CMET)   Atherosclerosis of aorta (HCC) - Primary   Chronic.  Controlled.  Continue with current medication regimen of Atorvastatin.  Refills sent today.  Labs ordered today.  Return to clinic in 6 months for reevaluation.  Call sooner if concerns arise.       Relevant Medications   atorvastatin (LIPITOR) 40 MG tablet   Other Relevant Orders   Comp Met (CMET)     Other   Hyperlipidemia   Chronic.  Controlled.  Continue with current medication regimen of Atorvastatin daily.  Refills sent today.  Labs ordered today.  Return to clinic in 6 months for reevaluation.  Call sooner if concerns arise.       Relevant Medications   atorvastatin (LIPITOR) 40 MG tablet   Other Relevant Orders   Lipid Profile   Morbid obesity (HCC)   Recommended eating smaller high protein, low fat meals more frequently and exercising 30 mins a day 5 times a week with a goal of 10-15lb weight loss in the next 3 months.       Relevant Orders   Comp Met (CMET)     Follow up plan: Return in about 6 months (around 08/24/2023) for Physical and Fasting labs.

## 2023-02-23 NOTE — Progress Notes (Deleted)
There were no vitals taken for this visit.   Subjective:    Patient ID: Thomas Hopkins, male    DOB: 1985-12-28, 37 y.o.   MRN: [id number]  HPI: Thomas Hopkins is a 37 y.o. male  No chief complaint on file.  Patient presents to clinic to establish care with new PCP.  Introduced to Publishing rights manager role and practice setting.  All questions answered.  Discussed provider/patient relationship and expectations.  Patient reports a history of of blood clots last being in 2018.  He is on Plavix for prevention and will continue to follow up with Vascular. He has a history of high cholesterol- on atorvastatin.  Patient was recently seen in the ER for GERD and possible stomach ulcers. He has not seen GI but needs a referral.    Patient denies a history of: Hypertension, Diabetes, Thyroid problems, Depression, Anxiety, and Neurological problems.  Patient has been having back pain that has been ongoing for several years.  His xrays in the ER showed spondylosis.  He is interested in injections.  He works 12 hour shifts and by the end of the shift he is in a lot of pain.   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) 03/19/2017   Hyperlipidemia 03/19/2017   Morbid obesity (HCC) 06/17/2019   Tobacco use disorder 05/04/2016   Pain in thoracic spine 09/20/2022   Current use of long term anticoagulation 11/11/2022   GERD (gastroesophageal reflux disease) 11/22/2022   Lymphedema 11/26/2022   Chronic venous insufficiency 11/26/2022   Resolved Ambulatory Problems    Diagnosis Date Noted   No Resolved Ambulatory Problems    Past Medical History:  Diagnosis Date   DVT of lower extremity, bilateral (HCC)      Past Surgical History:  Procedure Laterality Date   ABDOMINAL AORTOGRAM W/LOWER EXTREMITY Right 11/18/2016   Procedure: ABDOMINAL AORTOGRAM W/LOWER EXTREMITY;  Surgeon: Renford Dills, MD;  Location: [facility] INVASIVE CV LAB;  Service: Cardiovascular;  Laterality: Right;   APPENDECTOMY     LAPAROSCOPIC APPENDECTOMY N/A 10/14/2014   Procedure: APPENDECTOMY LAPAROSCOPIC;  Surgeon: Lattie Haw, MD;  Location: [facility] ORS;  Service: General;  Laterality: N/A;   TONSILLECTOMY     WISDOM TOOTH EXTRACTION       Review of Systems  Gastrointestinal:  Positive for abdominal pain.  Musculoskeletal:  Positive for back pain.    Per HPI unless specifically indicated above     Objective:    There were no vitals taken for this visit.  Wt Readings from Last 3 Encounters:  02/15/23 295 lb 3.2 oz (133.9 kg)  12/27/22 289 lb 3.2 oz (131.2 kg)  12/12/22 282 lb 6.6 oz (128.1 kg)    Physical Exam Vitals and nursing note reviewed.  Constitutional:      General: He is not in acute distress.    Appearance: Normal appearance. He is obese. He is not ill-appearing, toxic-appearing or diaphoretic.  HENT:     Head: Normocephalic.     Right Ear: External ear normal.     Left Ear: External ear normal.     Nose: Nose normal.  No congestion or rhinorrhea.     Mouth/Throat:     Mouth: Mucous membranes are moist.  Eyes:     General:        Right eye: No discharge.        Left eye: No discharge.     Extraocular Movements: Extraocular movements intact.     Conjunctiva/sclera: Conjunctivae normal.     Pupils: Pupils are equal, round, and reactive to light.  Cardiovascular:     Rate and Rhythm: Normal rate and regular rhythm.     Heart sounds: No murmur heard. Pulmonary:     Effort: Pulmonary effort is normal. No respiratory distress.     Breath sounds: Normal breath sounds. No wheezing, rhonchi or rales.   Abdominal:     General: Abdomen is flat. Bowel sounds are normal.  Musculoskeletal:     Cervical back: Normal range of motion and neck supple.  Skin:    General: Skin is warm and dry.     Capillary Refill: Capillary refill takes less than 2 seconds.  Neurological:     General: No focal deficit present.     Mental Status: He is alert and oriented to person, place, and time.  Psychiatric:        Mood and Affect: Mood normal.        Behavior: Behavior normal.        Thought Content: Thought content normal.        Judgment: Judgment normal.     Results for orders placed or performed in visit on 02/15/23  VAS [place] ABI WITH/WO TBI   Collection Time: 02/15/23 10:28 AM  Result Value Ref Range   Right ABI 1.29    Left ABI 1.19       Assessment & Plan:   Problem List Items Addressed This Visit       Cardiovascular and Mediastinum   Deep venous thrombosis (HCC)   Atherosclerosis of aorta (HCC) - Primary     Other   Hyperlipidemia   Morbid obesity (HCC)     Follow up plan: No follow-ups on file.

## 2023-02-23 NOTE — Assessment & Plan Note (Signed)
Chronic.  Controlled.  Continue with current medication regimen of Atorvastatin daily.  Refills sent today.  Labs ordered today.  Return to clinic in 6 months for reevaluation.  Call sooner if concerns arise.    

## 2023-02-23 NOTE — Assessment & Plan Note (Signed)
Chronic.  Controlled.  Continue with current medication regimen of Atorvastatin.  Refills sent today.  Labs ordered today.  Return to clinic in 6 months for reevaluation.  Call sooner if concerns arise.   

## 2023-02-23 NOTE — Assessment & Plan Note (Signed)
Ongoing concern.  Continue with Eliquis.  Encouraged elevation of legs and getting compression stockings.  Keep follow up with Vascular in December.  Encouraged smoking cessation.  Patient agrees and would like to do it without help from medication.

## 2023-02-24 LAB — COMPREHENSIVE METABOLIC PANEL
ALT: 28 [IU]/L (ref 0–44)
AST: 19 [IU]/L (ref 0–40)
Albumin: 4.3 g/dL (ref 4.1–5.1)
Alkaline Phosphatase: 69 [IU]/L (ref 44–121)
BUN/Creatinine Ratio: 10 (ref 9–20)
BUN: 8 mg/dL (ref 6–20)
Bilirubin Total: 0.2 mg/dL (ref 0.0–1.2)
CO2: 22 mmol/L (ref 20–29)
Calcium: 9.5 mg/dL (ref 8.7–10.2)
Chloride: 103 mmol/L (ref 96–106)
Creatinine, Ser: 0.83 mg/dL (ref 0.76–1.27)
Globulin, Total: 2.5 g/dL (ref 1.5–4.5)
Glucose: 137 mg/dL — ABNORMAL HIGH (ref 70–99)
Potassium: 3.8 mmol/L (ref 3.5–5.2)
Sodium: 141 mmol/L (ref 134–144)
Total Protein: 6.8 g/dL (ref 6.0–8.5)
eGFR: 116 mL/min/{1.73_m2} (ref >59)

## 2023-02-24 LAB — LIPID PANEL
Chol/HDL Ratio: 5.8 {ratio} — ABNORMAL HIGH (ref 0.0–5.0)
Cholesterol, Total: 198 mg/dL (ref 100–199)
HDL: 34 mg/dL — ABNORMAL LOW (ref >39)
LDL Chol Calc (NIH): 137 mg/dL — ABNORMAL HIGH (ref 0–99)
Triglycerides: 150 mg/dL — ABNORMAL HIGH (ref 0–149)
VLDL Cholesterol Cal: 27 mg/dL (ref 5–40)

## 2023-05-12 ENCOUNTER — Inpatient Hospital Stay: Payer: 59

## 2023-05-12 ENCOUNTER — Inpatient Hospital Stay: Payer: 59 | Admitting: Internal Medicine

## 2023-05-23 ENCOUNTER — Encounter: Payer: Self-pay | Admitting: Internal Medicine

## 2023-05-23 ENCOUNTER — Inpatient Hospital Stay: Payer: 59 | Attending: Internal Medicine

## 2023-05-23 ENCOUNTER — Inpatient Hospital Stay: Payer: 59 | Admitting: Internal Medicine

## 2023-05-23 ENCOUNTER — Ambulatory Visit: Payer: Self-pay | Admitting: Nurse Practitioner

## 2023-05-23 VITALS — BP 120/78 | HR 87 | Temp 97.8°F | Resp 16 | Wt 297.0 lb

## 2023-05-23 DIAGNOSIS — K219 Gastro-esophageal reflux disease without esophagitis: Secondary | ICD-10-CM | POA: Insufficient documentation

## 2023-05-23 DIAGNOSIS — I739 Peripheral vascular disease, unspecified: Secondary | ICD-10-CM | POA: Diagnosis not present

## 2023-05-23 DIAGNOSIS — Z7901 Long term (current) use of anticoagulants: Secondary | ICD-10-CM

## 2023-05-23 DIAGNOSIS — Z86718 Personal history of other venous thrombosis and embolism: Secondary | ICD-10-CM | POA: Diagnosis present

## 2023-05-23 DIAGNOSIS — E785 Hyperlipidemia, unspecified: Secondary | ICD-10-CM | POA: Diagnosis not present

## 2023-05-23 DIAGNOSIS — Z7982 Long term (current) use of aspirin: Secondary | ICD-10-CM | POA: Diagnosis not present

## 2023-05-23 DIAGNOSIS — I824Z1 Acute embolism and thrombosis of unspecified deep veins of right distal lower extremity: Secondary | ICD-10-CM | POA: Diagnosis not present

## 2023-05-23 LAB — CBC WITH DIFFERENTIAL/PLATELET
Abs Immature Granulocytes: 0.04 10*3/uL (ref 0.00–0.07)
Basophils Absolute: 0.1 10*3/uL (ref 0.0–0.1)
Basophils Relative: 1 %
Eosinophils Absolute: 0.3 10*3/uL (ref 0.0–0.5)
Eosinophils Relative: 4 %
HCT: 47.7 % (ref 39.0–52.0)
Hemoglobin: 16.1 g/dL (ref 13.0–17.0)
Immature Granulocytes: 1 %
Lymphocytes Relative: 26 %
Lymphs Abs: 2.1 10*3/uL (ref 0.7–4.0)
MCH: 30 pg (ref 26.0–34.0)
MCHC: 33.8 g/dL (ref 30.0–36.0)
MCV: 89 fL (ref 80.0–100.0)
Monocytes Absolute: 0.7 10*3/uL (ref 0.1–1.0)
Monocytes Relative: 8 %
Neutro Abs: 5 10*3/uL (ref 1.7–7.7)
Neutrophils Relative %: 60 %
Platelets: 301 10*3/uL (ref 150–400)
RBC: 5.36 MIL/uL (ref 4.22–5.81)
RDW: 13.3 % (ref 11.5–15.5)
WBC: 8.2 10*3/uL (ref 4.0–10.5)
nRBC: 0 % (ref 0.0–0.2)

## 2023-05-23 LAB — COMPREHENSIVE METABOLIC PANEL
ALT: 32 U/L (ref 0–44)
AST: 23 U/L (ref 15–41)
Albumin: 4.1 g/dL (ref 3.5–5.0)
Alkaline Phosphatase: 54 U/L (ref 38–126)
Anion gap: 6 (ref 5–15)
BUN: 10 mg/dL (ref 6–20)
CO2: 24 mmol/L (ref 22–32)
Calcium: 8.8 mg/dL — ABNORMAL LOW (ref 8.9–10.3)
Chloride: 105 mmol/L (ref 98–111)
Creatinine, Ser: 0.91 mg/dL (ref 0.61–1.24)
GFR, Estimated: >60 mL/min (ref >60)
Glucose, Bld: 98 mg/dL (ref 70–99)
Potassium: 4.1 mmol/L (ref 3.5–5.1)
Sodium: 135 mmol/L (ref 135–145)
Total Bilirubin: 0.6 mg/dL (ref 0.0–1.2)
Total Protein: 7.3 g/dL (ref 6.5–8.1)

## 2023-05-23 NOTE — Telephone Encounter (Signed)
 Copied from CRM [id number]. Topic: Clinical - Medical Advice >> May 23, 2023 10:31 AM Tiffany B wrote: Reason for CRM: Patient experiencing a tooth ache and would like PCP to prescribe antibiotics. Offered patient appointment, patient wanted a follow up response from PCP.   Chief Complaint: Possible tooth infection Symptoms: Tooth pain Frequency: Ongoing for a week Pertinent Negatives: Patient denies swelling, fever Disposition: [] ED /[] Urgent Care (no appt availability in office) / [x] Appointment(In office/virtual)/ []  [facility]/ [] Home Care/ [] Refused Recommended Disposition /[] [facility]/ []  Follow-up with PCP Additional Notes: Patient stated he has been having tooth pain for a week. It varies in intensity. Right now, the pain is 4/10, but it can be a 10/10 sometimes. He also stated that he has a broken tooth and he gets repeated tooth infections on occasion. Patient is aware that he needs dental work done but can't afford it. Recommended for patient to follow up with dentist, but he stated he does not have one at the moment. Appointment scheduled with PCP on 3/13. Offered to schedule sooner appt tomorrow at different office but patient declined.   Reason for Disposition  Toothache present > 24 hours  Answer Assessment - Initial Assessment Questions 1. LOCATION: "Which tooth is hurting?"  (e.g., right-side/left-side, upper/lower, front/back)     Right side, front  2. ONSET: "When did the toothache start?"  (e.g., hours, days)      Last week. Patient stated this has been a recurrent problem for a while, but most recent episode started a week ago.  3. SEVERITY: "How bad is the toothache?"  (Scale 1-10; mild, moderate or severe)   - MILD (1-3): doesn't interfere with chewing    - MODERATE (4-7): interferes with chewing, interferes with normal activities, awakens from sleep     - SEVERE (8-10): unable to eat, unable to do any normal activities, excruciating pain         4/10 at this time  4. SWELLING: "Is there any visible swelling of your face?"     No  5. OTHER SYMPTOMS: "Do you have any other symptoms?" (e.g., fever)     No  Protocols used: Toothache-A-AH

## 2023-05-23 NOTE — Progress Notes (Signed)
 Patient's mother just passed away last week, so he is really upset. He is having some tooth pain, and thinks it could be infected, he has no new questions or concerns for the doctor today.

## 2023-05-23 NOTE — Progress Notes (Signed)
 [facility]  [phone] [phone]  ID: Thomas Hopkins OB: Dec 10, 1985  MR#: [id number]  AOZ#:[id number]  Patient Care Team: Larae Grooms, NP as PCP - General (Nurse Practitioner) Michaelyn Barter, MD as Consulting Physician (Oncology)  REASON FOR VISIT: Reccurent lower extremity DVT  HPI: Thomas Hopkins is a 38 y.o. male with past medical history of left lower extremity DVT in 2016 (was considered provoked post appendicitis surgery) treated with 1 year Eliquis, PAD s/p thrombectomy on aspirin and Plavix, GERD, hyperlipidemia was referred to hematology for recurrent left lower extremity DVT.  Patient had an injury at work and sprained left ankle. With worsening leg swelling, he had Dopplers on 11/08/2022 showed occlusive DVT within the left femoral and popliteal vein.  He is on Eliquis.  History of DVT in 2016-post appendicectomy about 2 weeks later.  Venous Doppler showed DVT in both extremities located within distal left popliteal and left posterior tibial vein and right posterior tibial vein.  Treated with little over a year of Eliquis.  Patient reports was seen by hematology and was  offered hypercoagulable workup but did not pursue due to high co-pays.  History of right leg thromboembolectomy of the tibial vessel and angioplasty of the PT on 11/18/2016.  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  So on aspirin and Plavix.  GERD-follows with Dr. Allegra Lai.    Interval history Patient was seen today as follow-up for recurrent DVT, and PAD on anti-coagulation with Eliquis. He recently lost her mom.  Reports some tooth pain and concerned that he may have an infection.  He is going to follow-up with his primary for antibiotic.  Denies any further event of DVT since last visit.  REVIEW OF SYSTEMS:   ROS  As per HPI. Otherwise, a complete review of systems is negative.  PAST MEDICAL HISTORY: Past  Medical History:  Diagnosis Date   DVT of lower extremity, bilateral (HCC)    stopped Eliquis in 2017   GERD (gastroesophageal reflux disease)    Hyperlipidemia     PAST SURGICAL HISTORY: Past Surgical History:  Procedure Laterality Date   ABDOMINAL AORTOGRAM W/LOWER EXTREMITY Right 11/18/2016   Procedure: ABDOMINAL AORTOGRAM W/LOWER EXTREMITY;  Surgeon: Renford Dills, MD;  Location: [facility] INVASIVE CV LAB;  Service: Cardiovascular;  Laterality: Right;   APPENDECTOMY     LAPAROSCOPIC APPENDECTOMY N/A 10/14/2014   Procedure: APPENDECTOMY LAPAROSCOPIC;  Surgeon: Lattie Haw, MD;  Location: [facility] ORS;  Service: General;  Laterality: N/A;   TONSILLECTOMY     WISDOM TOOTH EXTRACTION      FAMILY HISTORY: Family History  Problem Relation Age of Onset   Diabetes Mother    Rectal cancer Father    Scoliosis Brother    Uterine cancer Maternal Aunt    Diabetes Mellitus II Maternal Grandmother    Hypertension Maternal Grandmother    Heart disease Maternal Grandfather     HEALTH MAINTENANCE: Social History   Tobacco Use   Smoking status: Every Day    Current packs/day: 0.25    Average packs/day: 0.3 packs/day for 15.0 years (3.8 ttl pk-yrs)    Types: Cigarettes   Smokeless tobacco: Never  Vaping Use   Vaping status: Never Used  Substance Use Topics   Alcohol use: Yes    Comment: very occasionally   Drug use: No     Allergies  Allergen Reactions   Bee Venom Hives   Morphine Nausea Only   Vicodin [  Hydrocodone-Acetaminophen] Nausea And Vomiting    Headache   Hydrocodone-Acetaminophen Nausea And Vomiting    Headache   Mushroom Extract Complex (Obsolete) Nausea And Vomiting   Olive Oil Nausea And Vomiting    Current Outpatient Medications  Medication Sig Dispense Refill   apixaban (ELIQUIS) 5 MG TABS tablet Take 1 tablet (5 mg total) by mouth 2 (two) times daily. 180 tablet 1   aspirin EC 81 MG tablet Take 81 mg by mouth daily. Swallow whole.     atorvastatin  (LIPITOR) 40 MG tablet TAKE 1 TABLET BY MOUTH EVERY DAY AFTER 6PM 90 tablet 1   omeprazole (PRILOSEC) 40 MG capsule Take 1 capsule (40 mg total) by mouth daily before breakfast. 30 capsule 3   oxyCODONE-acetaminophen (PERCOCET) 5-325 MG tablet Take 1 tablet by mouth every 6 (six) hours as needed for severe pain. 20 tablet 0   No current facility-administered medications for this visit.    OBJECTIVE: Vitals:   05/23/23 0947  BP: 120/78  Pulse: 87  Resp: 16  Temp: 97.8 F (36.6 C)  SpO2: 98%     Body mass index is 45.16 kg/m.      General: Well-developed, well-nourished, no acute distress. Eyes: Pink conjunctiva, anicteric sclera. HEENT: Normocephalic, moist mucous membranes, clear oropharnyx. Lungs: Clear to auscultation bilaterally. Heart: Regular rate and rhythm. No rubs, murmurs, or gallops. Abdomen: Soft, nontender, nondistended. No organomegaly noted, normoactive bowel sounds. Musculoskeletal: No edema, cyanosis, or clubbing. Neuro: Alert, answering all questions appropriately. Cranial nerves grossly intact. Skin: No rashes or petechiae noted. Psych: Normal affect. Lymphatics: No cervical, calvicular, axillary or inguinal LAD.   LAB RESULTS:  Lab Results  Component Value Date   NA 141 02/23/2023   K 3.8 02/23/2023   CL 103 02/23/2023   CO2 22 02/23/2023   GLUCOSE 137 (H) 02/23/2023   BUN 8 02/23/2023   CREATININE 0.83 02/23/2023   CALCIUM 9.5 02/23/2023   PROT 6.8 02/23/2023   ALBUMIN 4.3 02/23/2023   AST 19 02/23/2023   ALT 28 02/23/2023   ALKPHOS 69 02/23/2023   BILITOT 0.2 02/23/2023   GFRNONAA >60 12/12/2022   GFRAA >60 11/28/2017    Lab Results  Component Value Date   WBC 8.2 05/23/2023   NEUTROABS 5.0 05/23/2023   HGB 16.1 05/23/2023   HCT 47.7 05/23/2023   MCV 89.0 05/23/2023   PLT 301 05/23/2023    No results found for: "TIBC", "FERRITIN", "IRONPCTSAT"   STUDIES: No results found.  ASSESSMENT AND PLAN:   Thomas Hopkins is a 38  y.o. male with pmh of bilateral lower extremity DVT in 2016 (was considered provoked post appendicitis surgery) treated with 1 year Eliquis, PAD s/p thrombectomy, GERD, hyperlipidemia was referred to hematology for recurrent left lower extremity DVT.  # Recurrent LE DVT - Patient had an injury at work and sprained left ankle. With worsening leg swelling, he had Dopplers on 11/08/2022 showed occlusive DVT within the left femoral and popliteal vein.   - History of DVT in 2016-post appendicectomy about 2 weeks later.  Venous Doppler showed DVT in both extremities located within distal left popliteal and left posterior tibial vein and right posterior tibial vein.  Treated with little over a year of Eliquis.  Patient reports was seen by hematology and was  offered hypercoagulable workup but did not pursue due to high co-pays.  -Considering he had recurrent episodes of DVT and history of PAD, I recommended long-term anticoagulation with Eliquis.  I will hold off on hypercoag  workup at this time as it will not change the management.  Discussed about monitoring for signs of bleeding.  Continue follow-up with vascular surgery.  # PAD - History of right leg thromboembolectomy of the tibial vessel and angioplasty of the PT on 11/18/2016.  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  -Continue with aspirin 81 mg daily.  RTC as needed  Patient expressed understanding and was in agreement with this plan. He also understands that He can call clinic at any time with any questions, concerns, or complaints.   I spent a total of 25 minutes reviewing chart data, face-to-face evaluation with the patient, counseling and coordination of care as detailed above.  Michaelyn Barter, MD   05/23/2023 9:55 AM

## 2023-05-25 ENCOUNTER — Encounter: Payer: Self-pay | Admitting: Nurse Practitioner

## 2023-05-25 ENCOUNTER — Ambulatory Visit: Admitting: Nurse Practitioner

## 2023-05-25 VITALS — BP 117/79 | HR 70 | Ht 68.0 in | Wt 299.4 lb

## 2023-05-25 DIAGNOSIS — K047 Periapical abscess without sinus: Secondary | ICD-10-CM

## 2023-05-25 MED ORDER — TRAMADOL HCL 50 MG PO TABS: 50.0000 mg | ORAL_TABLET | Freq: Three times a day (TID) | ORAL | 0 refills | Status: AC | PRN

## 2023-05-25 MED ORDER — AMOXICILLIN 500 MG PO CAPS: 500.0000 mg | ORAL_CAPSULE | Freq: Two times a day (BID) | ORAL | 0 refills | Status: AC

## 2023-05-25 NOTE — Progress Notes (Signed)
 BP 117/79 (BP Location: Right Arm, Patient Position: Sitting, Cuff Size: Large)   Pulse 70   Ht 5\' 8"  (1.727 m)   Wt 299 lb 6.4 oz (135.8 kg)   SpO2 97%   BMI 45.52 kg/m    Subjective:    Patient ID: Thomas Hopkins, male    DOB: 1985/07/01, 38 y.o.   MRN: [id number]  HPI: Thomas Hopkins is a 38 y.o. male  Chief Complaint  Patient presents with   Dental Pain    Right side, top and bottom, prior treatment has been oxycodone that was previously prescribed for an injury and tylenol   Medication Refill    Oxycodone    Patient presents to clinic with complaints of a tooth ache that started about 2 weeks ago.  Pain is generally at a 4 but then it can go up to a 10/10 with throbbing.  He knows he has a broken tooth.  He feels like it is infected. Denies any fever or discharge from the area.       Relevant past medical, surgical, family and social history reviewed and updated as indicated. Interim medical history since our last visit reviewed. Allergies and medications reviewed and updated.  Review of Systems  HENT:         Tooth pain    Per HPI unless specifically indicated above     Objective:    BP 117/79 (BP Location: Right Arm, Patient Position: Sitting, Cuff Size: Large)   Pulse 70   Ht 5\' 8"  (1.727 m)   Wt 299 lb 6.4 oz (135.8 kg)   SpO2 97%   BMI 45.52 kg/m   Wt Readings from Last 3 Encounters:  05/25/23 299 lb 6.4 oz (135.8 kg)  05/23/23 297 lb (134.7 kg)  02/23/23 294 lb 9.6 oz (133.6 kg)    Physical Exam Vitals and nursing note reviewed.  Constitutional:      General: He is not in acute distress.    Appearance: Normal appearance. He is not ill-appearing, toxic-appearing or diaphoretic.  HENT:     Head: Normocephalic.     Right Ear: External ear normal.     Left Ear: External ear normal.     Nose: Nose normal. No congestion or rhinorrhea.     Mouth/Throat:     Mouth: Mucous membranes are moist.     Comments: Black and decaying teeth  on top row. Eyes:     General:        Right eye: No discharge.        Left eye: No discharge.     Extraocular Movements: Extraocular movements intact.     Conjunctiva/sclera: Conjunctivae normal.     Pupils: Pupils are equal, round, and reactive to light.  Cardiovascular:     Rate and Rhythm: Normal rate and regular rhythm.     Heart sounds: No murmur heard. Pulmonary:     Effort: Pulmonary effort is normal. No respiratory distress.     Breath sounds: Normal breath sounds. No wheezing, rhonchi or rales.  Abdominal:     General: Abdomen is flat. Bowel sounds are normal.  Musculoskeletal:     Cervical back: Normal range of motion and neck supple.  Skin:    General: Skin is warm and dry.     Capillary Refill: Capillary refill takes less than 2 seconds.  Neurological:     General: No focal deficit present.     Mental Status: He is alert and oriented to person, place,  and time.  Psychiatric:        Mood and Affect: Mood normal.        Behavior: Behavior normal.        Thought Content: Thought content normal.        Judgment: Judgment normal.     Results for orders placed or performed in visit on 05/23/23  Comprehensive metabolic panel   Collection Time: 05/23/23  9:33 AM  Result Value Ref Range   Sodium 135 135 - 145 mmol/L   Potassium 4.1 3.5 - 5.1 mmol/L   Chloride 105 98 - 111 mmol/L   CO2 24 22 - 32 mmol/L   Glucose, Bld 98 70 - 99 mg/dL   BUN 10 6 - 20 mg/dL   Creatinine, Ser 4.09 0.61 - 1.24 mg/dL   Calcium 8.8 (L) 8.9 - 10.3 mg/dL   Total Protein 7.3 6.5 - 8.1 g/dL   Albumin 4.1 3.5 - 5.0 g/dL   AST 23 15 - 41 U/L   ALT 32 0 - 44 U/L   Alkaline Phosphatase 54 38 - 126 U/L   Total Bilirubin 0.6 0.0 - 1.2 mg/dL   GFR, Estimated >81 >19 mL/min   Anion gap 6 5 - 15  CBC with Differential/Platelet   Collection Time: 05/23/23  9:33 AM  Result Value Ref Range   WBC 8.2 4.0 - 10.5 K/uL   RBC 5.36 4.22 - 5.81 MIL/uL   Hemoglobin 16.1 13.0 - 17.0 g/dL   HCT 14.7 82.9  - 56.2 %   MCV 89.0 80.0 - 100.0 fL   MCH 30.0 26.0 - 34.0 pg   MCHC 33.8 30.0 - 36.0 g/dL   RDW 13.0 86.5 - 78.4 %   Platelets 301 150 - 400 K/uL   nRBC 0.0 0.0 - 0.2 %   Neutrophils Relative % 60 %   Neutro Abs 5.0 1.7 - 7.7 K/uL   Lymphocytes Relative 26 %   Lymphs Abs 2.1 0.7 - 4.0 K/uL   Monocytes Relative 8 %   Monocytes Absolute 0.7 0.1 - 1.0 K/uL   Eosinophils Relative 4 %   Eosinophils Absolute 0.3 0.0 - 0.5 K/uL   Basophils Relative 1 %   Basophils Absolute 0.1 0.0 - 0.1 K/uL   Immature Granulocytes 1 %   Abs Immature Granulocytes 0.04 0.00 - 0.07 K/uL      Assessment & Plan:   Problem List Items Addressed This Visit   None Visit Diagnoses       Tooth infection    -  Primary   Several decaying teeth, mostly in the top front.  Will treat with Amoxcillin. unable to take NSAIDS- tramadol given. Encouraged patient to see Oral Surgeon.        Follow up plan: No follow-ups on file.

## 2023-08-01 ENCOUNTER — Encounter (INDEPENDENT_AMBULATORY_CARE_PROVIDER_SITE_OTHER): Payer: Self-pay

## 2023-08-10 ENCOUNTER — Encounter: Payer: Self-pay | Admitting: Nurse Practitioner

## 2023-08-10 ENCOUNTER — Ambulatory Visit (INDEPENDENT_AMBULATORY_CARE_PROVIDER_SITE_OTHER): Admitting: Nurse Practitioner

## 2023-08-10 VITALS — BP 117/69 | HR 60 | Temp 98.2°F | Resp 17 | Ht 67.99 in | Wt 300.6 lb

## 2023-08-10 DIAGNOSIS — F17200 Nicotine dependence, unspecified, uncomplicated: Secondary | ICD-10-CM

## 2023-08-10 DIAGNOSIS — Z23 Encounter for immunization: Secondary | ICD-10-CM | POA: Diagnosis not present

## 2023-08-10 DIAGNOSIS — Z0000 Encounter for general adult medical examination without abnormal findings: Secondary | ICD-10-CM

## 2023-08-10 DIAGNOSIS — R7303 Prediabetes: Secondary | ICD-10-CM

## 2023-08-10 DIAGNOSIS — E782 Mixed hyperlipidemia: Secondary | ICD-10-CM | POA: Diagnosis not present

## 2023-08-10 MED ORDER — APIXABAN 5 MG PO TABS: 5.0000 mg | ORAL_TABLET | Freq: Two times a day (BID) | ORAL | 1 refills | Status: AC

## 2023-08-10 MED ORDER — METHYLPREDNISOLONE 4 MG PO TBPK
ORAL_TABLET | ORAL | 0 refills | Status: DC
Start: 2023-08-10 — End: 2023-08-17

## 2023-08-10 MED ORDER — ATORVASTATIN CALCIUM 40 MG PO TABS: ORAL_TABLET | ORAL | 1 refills | Status: AC

## 2023-08-10 NOTE — Assessment & Plan Note (Signed)
 Discussed cessation and available treatment including nicotine  replacement options, pharmacologic treatment, and/or online resources. Based on our discussion, he does not plan to initiate treatment today. Total time spent on discussion: 5 minutes.

## 2023-08-10 NOTE — Assessment & Plan Note (Signed)
 Labs ordered at visit today.  Will make recommendations based on lab results.

## 2023-08-10 NOTE — Progress Notes (Signed)
 BP 117/69 (BP Location: Left Arm, Patient Position: Sitting, Cuff Size: Large)   Pulse 60   Temp 98.2 F (36.8 C) (Oral)   Resp 17   Ht 5' 7.99" (1.727 m)   Wt (!) 300 lb 9.6 oz (136.4 kg)   SpO2 97%   BMI 45.72 kg/m    Subjective:    Patient ID: Thomas Hopkins, male    DOB: November 14, 1985, 38 y.o.   MRN: [id number]  HPI: Thomas Hopkins is a 38 y.o. male presenting on 08/10/2023 for comprehensive medical examination. Current medical complaints include:none  He currently lives with: Interim Problems from his last visit: no  DVT Recently saw vascular.  DVT is improving with Eliquis .  Will remain on eliquis  indefinitely.  This is not his first clot.  He has followed up with Hematology and was told he will need to be on blood thinners for the rest of his life.   HYPERLIPIDEMIA Hyperlipidemia status: excellent compliance Satisfied with current treatment?  yes Side effects:  no Medication compliance: excellent compliance Past cholesterol meds: atorvastain (lipitor) Supplements: none Aspirin :  yes The ASCVD Risk score (Arnett DK, et al., 2019) failed to calculate for the following reasons: The 2019 ASCVD risk score is only valid for ages 76 to 38 Chest pain:  no Coronary artery disease:  no Family history CAD:  no Family history early CAD:  no  SMOKING CESSATION Smoking Status: current everyday smoker Smoking Amount: 1 pack every 2-3 days Smoking Onset: teenager Smoking Quit Date:  Smoking triggers: work and stress Type of tobacco use:  cigarettes Children in the house: no Other household members who smoke: no Treatments attempted:  Pneumovax:    Denies HA, CP, SOB, dizziness, palpitations, visual changes, and lower extremity swelling.    Depression Screen done today and results listed below:     08/10/2023   10:40 AM 05/25/2023    1:33 PM 02/23/2023    8:36 AM 12/27/2022   11:18 AM 11/11/2022   11:41 AM  Depression screen PHQ 2/9  Decreased Interest  0 0 0 0 0  Down, Depressed, Hopeless 0 0 0 0 0  PHQ - 2 Score 0 0 0 0 0  Altered sleeping 0 0 0 0   Tired, decreased energy 0 0 0 1   Change in appetite 0 0 0 0   Feeling bad or failure about yourself  0 0 0 0   Trouble concentrating 0 0 0 0   Moving slowly or fidgety/restless 0 0 0 0   Suicidal thoughts 0 0 0 0   PHQ-9 Score 0 0 0 1   Difficult doing work/chores    Somewhat difficult     The patient does not have a history of falls. I did complete a risk assessment for falls. A plan of care for falls was documented.   Past Medical History:  Past Medical History:  Diagnosis Date   DVT of lower extremity, bilateral (HCC)    stopped Eliquis  in 2017   GERD (gastroesophageal reflux disease)    Hyperlipidemia     Surgical History:  Past Surgical History:  Procedure Laterality Date   ABDOMINAL AORTOGRAM W/LOWER EXTREMITY Right 11/18/2016   Procedure: ABDOMINAL AORTOGRAM W/LOWER EXTREMITY;  Surgeon: Jackquelyn Mass, MD;  Location: [facility];  Service: Cardiovascular;  Laterality: Right;   APPENDECTOMY     LAPAROSCOPIC APPENDECTOMY N/A 10/14/2014   Procedure: APPENDECTOMY LAPAROSCOPIC;  Surgeon: Claudia Cuff, MD;  Location: [facility];  Service: General;  Laterality: N/A;   TONSILLECTOMY     WISDOM TOOTH EXTRACTION      Medications:  Current Outpatient Medications on File Prior to Visit  Medication Sig   aspirin  EC 81 MG tablet Take 81 mg by mouth daily. Swallow whole.   omeprazole  (PRILOSEC ) 40 MG capsule Take 1 capsule (40 mg total) by mouth daily before breakfast. (Patient not taking: Reported on 08/10/2023)   No current facility-administered medications on file prior to visit.    Allergies:  Allergies  Allergen Reactions   Bee Venom Hives   Morphine  Nausea Only   Vicodin [Hydrocodone -Acetaminophen ] Nausea And Vomiting    Headache   Hydrocodone -Acetaminophen  Nausea And Vomiting    Headache   Mushroom Extract Complex (Obsolete) Nausea And Vomiting    Olive Oil Nausea And Vomiting    Social History:  Social History   Socioeconomic History   Marital status: Single    Spouse name: Not on file   Number of children: Not on file   Years of education: Not on file   Highest education level: Not on file  Occupational History   Not on file  Tobacco Use   Smoking status: Every Day    Current packs/day: 0.25    Average packs/day: 0.3 packs/day for 15.0 years (3.8 ttl pk-yrs)    Types: Cigarettes   Smokeless tobacco: Never  Vaping Use   Vaping status: Never Used  Substance and Sexual Activity   Alcohol use: Yes    Comment: very occasionally   Drug use: No   Sexual activity: Not Currently  Other Topics Concern   Not on file  Social History Narrative   Not on file   Social Drivers of Health   Financial Resource Strain: Not on file  Food Insecurity: No Food Insecurity (11/11/2022)   Hunger Vital Sign    Worried About Running Out of Food in the Last Year: Never true    Ran Out of Food in the Last Year: Never true  Transportation Needs: No Transportation Needs (11/11/2022)   PRAPARE - Administrator, Civil Service (Medical): No    Lack of Transportation (Non-Medical): No  Physical Activity: Not on file  Stress: Not on file  Social Connections: Not on file  Intimate Partner Violence: Not At Risk (11/11/2022)   Humiliation, Afraid, Rape, and Kick questionnaire    Fear of Current or Ex-Partner: No    Emotionally Abused: No    Physically Abused: No    Sexually Abused: No   Social History   Tobacco Use  Smoking Status Every Day   Current packs/day: 0.25   Average packs/day: 0.3 packs/day for 15.0 years (3.8 ttl pk-yrs)   Types: Cigarettes  Smokeless Tobacco Never   Social History   Substance and Sexual Activity  Alcohol Use Yes   Comment: very occasionally    Family History:  Family History  Problem Relation Age of Onset   Diabetes Mother    Rectal cancer Father    Scoliosis Brother    Uterine cancer  Maternal Aunt    Diabetes Mellitus II Maternal Grandmother    Hypertension Maternal Grandmother    Heart disease Maternal Grandfather     Past medical history, surgical history, medications, allergies, family history and social history reviewed with patient today and changes made to appropriate areas of the chart.   Review of Systems  Eyes:  Negative for blurred vision and double vision.  Respiratory:  Negative for shortness of breath.  Cardiovascular:  Negative for chest pain, palpitations and leg swelling.  Neurological:  Negative for dizziness and headaches.   All other ROS negative except what is listed above and in the HPI.      Objective:     BP 117/69 (BP Location: Left Arm, Patient Position: Sitting, Cuff Size: Large)   Pulse 60   Temp 98.2 F (36.8 C) (Oral)   Resp 17   Ht 5' 7.99" (1.727 m)   Wt (!) 300 lb 9.6 oz (136.4 kg)   SpO2 97%   BMI 45.72 kg/m   Wt Readings from Last 3 Encounters:  08/10/23 (!) 300 lb 9.6 oz (136.4 kg)  05/25/23 299 lb 6.4 oz (135.8 kg)  05/23/23 297 lb (134.7 kg)    Physical Exam Vitals and nursing note reviewed.  Constitutional:      General: He is not in acute distress.    Appearance: Normal appearance. He is not ill-appearing, toxic-appearing or diaphoretic.  HENT:     Head: Normocephalic.     Right Ear: Tympanic membrane, ear canal and external ear normal.     Left Ear: Tympanic membrane, ear canal and external ear normal.     Nose: Nose normal. No congestion or rhinorrhea.     Mouth/Throat:     Mouth: Mucous membranes are moist.  Eyes:     General:        Right eye: No discharge.        Left eye: No discharge.     Extraocular Movements: Extraocular movements intact.     Conjunctiva/sclera: Conjunctivae normal.     Pupils: Pupils are equal, round, and reactive to light.  Cardiovascular:     Rate and Rhythm: Normal rate and regular rhythm.     Heart sounds: No murmur heard. Pulmonary:     Effort: Pulmonary effort is  normal. No respiratory distress.     Breath sounds: Normal breath sounds. No wheezing, rhonchi or rales.  Abdominal:     General: Abdomen is flat. Bowel sounds are normal. There is no distension.     Palpations: Abdomen is soft.     Tenderness: There is no abdominal tenderness. There is no guarding.  Musculoskeletal:     Cervical back: Normal range of motion and neck supple.  Skin:    General: Skin is warm and dry.     Capillary Refill: Capillary refill takes less than 2 seconds.  Neurological:     General: No focal deficit present.     Mental Status: He is alert and oriented to person, place, and time.     Cranial Nerves: No cranial nerve deficit.     Motor: No weakness.     Deep Tendon Reflexes: Reflexes normal.  Psychiatric:        Mood and Affect: Mood normal.        Behavior: Behavior normal.        Thought Content: Thought content normal.        Judgment: Judgment normal.     Results for orders placed or performed in visit on 05/23/23  Comprehensive metabolic panel   Collection Time: 05/23/23  9:33 AM  Result Value Ref Range   Sodium 135 135 - 145 mmol/L   Potassium 4.1 3.5 - 5.1 mmol/L   Chloride 105 98 - 111 mmol/L   CO2 24 22 - 32 mmol/L   Glucose, Bld 98 70 - 99 mg/dL   BUN 10 6 - 20 mg/dL   Creatinine, Ser 1.61 0.61 -  1.24 mg/dL   Calcium  8.8 (L) 8.9 - 10.3 mg/dL   Total Protein 7.3 6.5 - 8.1 g/dL   Albumin 4.1 3.5 - 5.0 g/dL   AST 23 15 - 41 U/L   ALT 32 0 - 44 U/L   Alkaline Phosphatase 54 38 - 126 U/L   Total Bilirubin 0.6 0.0 - 1.2 mg/dL   GFR, Estimated >86 >57 mL/min   Anion gap 6 5 - 15  CBC with Differential/Platelet   Collection Time: 05/23/23  9:33 AM  Result Value Ref Range   WBC 8.2 4.0 - 10.5 K/uL   RBC 5.36 4.22 - 5.81 MIL/uL   Hemoglobin 16.1 13.0 - 17.0 g/dL   HCT 84.6 96.2 - 95.2 %   MCV 89.0 80.0 - 100.0 fL   MCH 30.0 26.0 - 34.0 pg   MCHC 33.8 30.0 - 36.0 g/dL   RDW 84.1 32.4 - 40.1 %   Platelets 301 150 - 400 K/uL   nRBC 0.0 0.0  - 0.2 %   Neutrophils Relative % 60 %   Neutro Abs 5.0 1.7 - 7.7 K/uL   Lymphocytes Relative 26 %   Lymphs Abs 2.1 0.7 - 4.0 K/uL   Monocytes Relative 8 %   Monocytes Absolute 0.7 0.1 - 1.0 K/uL   Eosinophils Relative 4 %   Eosinophils Absolute 0.3 0.0 - 0.5 K/uL   Basophils Relative 1 %   Basophils Absolute 0.1 0.0 - 0.1 K/uL   Immature Granulocytes 1 %   Abs Immature Granulocytes 0.04 0.00 - 0.07 K/uL      Assessment & Plan:   Problem List Items Addressed This Visit       Other   Hyperlipidemia   Labs ordered at visit today.  Will make recommendations based on lab results.        Relevant Medications   apixaban  (ELIQUIS ) 5 MG TABS tablet   atorvastatin  (LIPITOR) 40 MG tablet   Other Relevant Orders   Lipid panel   Tobacco use disorder   Discussed cessation and available treatment including nicotine  replacement options, pharmacologic treatment, and/or online resources. Based on our discussion, he does not plan to initiate treatment today. Total time spent on discussion: 5 minutes.        Prediabetes   Labs ordered at visit today.  Will make recommendations based on lab results.        Relevant Orders   HgB A1c   Other Visit Diagnoses       Annual physical exam    -  Primary   Health maintenance reviewed during visit today.  Labs ordered.  Vaccines reviewed.   Relevant Orders   TSH   Lipid panel   CBC with Differential/Platelet   Comprehensive metabolic panel with GFR     Need for vaccination for Strep pneumoniae       Relevant Orders   Pneumococcal conjugate vaccine 20-valent (Prevnar 20) (Completed)        Discussed aspirin  prophylaxis for myocardial infarction prevention and decision was not indicated  LABORATORY TESTING:  Health maintenance labs ordered today as discussed above.      IMMUNIZATIONS:   - Tdap: Tetanus vaccination status reviewed: last tetanus booster within 10 years. - Influenza: Postponed to flu season - Pneumovax:  Administered today - Prevnar: Administered today - COVID: Not applicable - HPV: Not applicable - Shingrix vaccine: Not applicable  SCREENING: - Colonoscopy: Not applicable  Discussed with patient purpose of the colonoscopy is to detect colon cancer at  curable precancerous or early stages   - AAA Screening: Not applicable  -Hearing Test: Not applicable  -Spirometry: Not applicable   PATIENT COUNSELING:    Sexuality: Discussed sexually transmitted diseases, partner selection, use of condoms, avoidance of unintended pregnancy  and contraceptive alternatives.   Advised to avoid cigarette smoking.  I discussed with the patient that most people either abstain from alcohol or drink within safe limits (<=14/week and <=4 drinks/occasion for males, <=7/weeks and <= 3 drinks/occasion for females) and that the risk for alcohol disorders and other health effects rises proportionally with the number of drinks per week and how often a drinker exceeds daily limits.  Discussed cessation/primary prevention of drug use and availability of treatment for abuse.   Diet: Encouraged to adjust caloric intake to maintain  or achieve ideal body weight, to reduce intake of dietary saturated fat and total fat, to limit sodium intake by avoiding high sodium foods and not adding table salt, and to maintain adequate dietary potassium and calcium  preferably from fresh fruits, vegetables, and low-fat dairy products.    stressed the importance of regular exercise  Injury prevention: Discussed safety belts, safety helmets, smoke detector, smoking near bedding or upholstery.   Dental health: Discussed importance of regular tooth brushing, flossing, and dental visits.   Follow up plan: NEXT PREVENTATIVE PHYSICAL DUE IN 1 YEAR. Return in about 6 months (around 02/10/2024) for HTN, HLD, DM2 FU.

## 2023-08-11 ENCOUNTER — Ambulatory Visit: Payer: Self-pay | Admitting: Nurse Practitioner

## 2023-08-11 LAB — COMPREHENSIVE METABOLIC PANEL WITH GFR
ALT: 36 IU/L (ref 0–44)
AST: 22 IU/L (ref 0–40)
Albumin: 4.5 g/dL (ref 4.1–5.1)
Alkaline Phosphatase: 72 IU/L (ref 44–121)
BUN/Creatinine Ratio: 13 (ref 9–20)
BUN: 10 mg/dL (ref 6–20)
Bilirubin Total: <0.2 mg/dL (ref 0.0–1.2)
CO2: 20 mmol/L (ref 20–29)
Calcium: 9.4 mg/dL (ref 8.7–10.2)
Chloride: 104 mmol/L (ref 96–106)
Creatinine, Ser: 0.77 mg/dL (ref 0.76–1.27)
Globulin, Total: 2.4 g/dL (ref 1.5–4.5)
Glucose: 89 mg/dL (ref 70–99)
Potassium: 4.7 mmol/L (ref 3.5–5.2)
Sodium: 137 mmol/L (ref 134–144)
Total Protein: 6.9 g/dL (ref 6.0–8.5)
eGFR: 118 mL/min/{1.73_m2} (ref >59)

## 2023-08-11 LAB — LIPID PANEL
Chol/HDL Ratio: 4.8 ratio (ref 0.0–5.0)
Cholesterol, Total: 186 mg/dL (ref 100–199)
HDL: 39 mg/dL — ABNORMAL LOW (ref >39)
LDL Chol Calc (NIH): 120 mg/dL — ABNORMAL HIGH (ref 0–99)
Triglycerides: 154 mg/dL — ABNORMAL HIGH (ref 0–149)
VLDL Cholesterol Cal: 27 mg/dL (ref 5–40)

## 2023-08-11 LAB — CBC WITH DIFFERENTIAL/PLATELET
Basophils Absolute: 0.1 10*3/uL (ref 0.0–0.2)
Basos: 1 %
EOS (ABSOLUTE): 0.5 10*3/uL — ABNORMAL HIGH (ref 0.0–0.4)
Eos: 5 %
Hematocrit: 48.8 % (ref 37.5–51.0)
Hemoglobin: 16.1 g/dL (ref 13.0–17.7)
Immature Grans (Abs): 0 10*3/uL (ref 0.0–0.1)
Immature Granulocytes: 1 %
Lymphocytes Absolute: 2.2 10*3/uL (ref 0.7–3.1)
Lymphs: 25 %
MCH: 30.3 pg (ref 26.6–33.0)
MCHC: 33 g/dL (ref 31.5–35.7)
MCV: 92 fL (ref 79–97)
Monocytes Absolute: 0.8 10*3/uL (ref 0.1–0.9)
Monocytes: 9 %
Neutrophils Absolute: 5.1 10*3/uL (ref 1.4–7.0)
Neutrophils: 59 %
Platelets: 288 10*3/uL (ref 150–450)
RBC: 5.31 x10E6/uL (ref 4.14–5.80)
RDW: 12.7 % (ref 11.6–15.4)
WBC: 8.7 10*3/uL (ref 3.4–10.8)

## 2023-08-11 LAB — TSH: TSH: 1.85 u[IU]/mL (ref 0.450–4.500)

## 2023-08-11 LAB — HEMOGLOBIN A1C
Est. average glucose Bld gHb Est-mCnc: 108 mg/dL
Hgb A1c MFr Bld: 5.4 % (ref 4.8–5.6)

## 2023-08-17 ENCOUNTER — Ambulatory Visit (INDEPENDENT_AMBULATORY_CARE_PROVIDER_SITE_OTHER): Admitting: Nurse Practitioner

## 2023-08-17 ENCOUNTER — Encounter: Payer: Self-pay | Admitting: Nurse Practitioner

## 2023-08-17 VITALS — BP 116/71 | HR 62 | Ht 68.0 in | Wt 303.0 lb

## 2023-08-17 DIAGNOSIS — M792 Neuralgia and neuritis, unspecified: Secondary | ICD-10-CM | POA: Diagnosis not present

## 2023-08-17 MED ORDER — GABAPENTIN 100 MG PO CAPS: 100.0000 mg | ORAL_CAPSULE | Freq: Every day | ORAL | 1 refills | Status: AC

## 2023-08-17 NOTE — Progress Notes (Signed)
 BP 116/71   Pulse 62   Ht 5\' 8"  (1.727 m)   Wt (!) 303 lb (137.4 kg)   BMI 46.07 kg/m    Subjective:    Patient ID: Thomas Hopkins, male    DOB: March 06, 1986, 38 y.o.   MRN: [id number]  HPI: Thomas Hopkins is a 38 y.o. male  Chief Complaint  Patient presents with   Leg Injury    Left leg, burning sensation   Patient states he has been having a burning sensation in his left leg.  This has been on and off for a little while.  Does feel like it swells at times.  Did have on occlusive DVT in left femoral/popliteal- he is on Eliquis .   Relevant past medical, surgical, family and social history reviewed and updated as indicated. Interim medical history since our last visit reviewed. Allergies and medications reviewed and updated.  Review of Systems  Musculoskeletal:        Left leg pain    Per HPI unless specifically indicated above     Objective:     BP 116/71   Pulse 62   Ht 5\' 8"  (1.727 m)   Wt (!) 303 lb (137.4 kg)   BMI 46.07 kg/m   Wt Readings from Last 3 Encounters:  08/17/23 (!) 303 lb (137.4 kg)  08/10/23 (!) 300 lb 9.6 oz (136.4 kg)  05/25/23 299 lb 6.4 oz (135.8 kg)    Physical Exam Vitals and nursing note reviewed.  Constitutional:      General: He is not in acute distress.    Appearance: Normal appearance. He is not ill-appearing, toxic-appearing or diaphoretic.  HENT:     Head: Normocephalic.     Right Ear: External ear normal.     Left Ear: External ear normal.     Nose: Nose normal. No congestion or rhinorrhea.     Mouth/Throat:     Mouth: Mucous membranes are moist.  Eyes:     General:        Right eye: No discharge.        Left eye: No discharge.     Extraocular Movements: Extraocular movements intact.     Conjunctiva/sclera: Conjunctivae normal.     Pupils: Pupils are equal, round, and reactive to light.  Cardiovascular:     Rate and Rhythm: Normal rate and regular rhythm.     Heart sounds: No murmur heard. Pulmonary:      Effort: Pulmonary effort is normal. No respiratory distress.     Breath sounds: Normal breath sounds. No wheezing, rhonchi or rales.  Abdominal:     General: Abdomen is flat. Bowel sounds are normal.  Musculoskeletal:        General: No swelling, tenderness, deformity or signs of injury.     Cervical back: Normal range of motion and neck supple.     Right lower leg: No edema.     Left lower leg: No edema.  Skin:    General: Skin is warm and dry.     Capillary Refill: Capillary refill takes less than 2 seconds.  Neurological:     General: No focal deficit present.     Mental Status: He is alert and oriented to person, place, and time.  Psychiatric:        Mood and Affect: Mood normal.        Behavior: Behavior normal.        Thought Content: Thought content normal.  Judgment: Judgment normal.     Results for orders placed or performed in visit on 08/10/23  TSH   Collection Time: 08/10/23 11:13 AM  Result Value Ref Range   TSH 1.850 0.450 - 4.500 uIU/mL  Lipid panel   Collection Time: 08/10/23 11:13 AM  Result Value Ref Range   Cholesterol, Total 186 100 - 199 mg/dL   Triglycerides 865 (H) 0 - 149 mg/dL   HDL 39 (L) >78 mg/dL   VLDL Cholesterol Cal 27 5 - 40 mg/dL   LDL Chol Calc (NIH) 469 (H) 0 - 99 mg/dL   Chol/HDL Ratio 4.8 0.0 - 5.0 ratio  CBC with Differential/Platelet   Collection Time: 08/10/23 11:13 AM  Result Value Ref Range   WBC 8.7 3.4 - 10.8 x10E3/uL   RBC 5.31 4.14 - 5.80 x10E6/uL   Hemoglobin 16.1 13.0 - 17.7 g/dL   Hematocrit 62.9 52.8 - 51.0 %   MCV 92 79 - 97 fL   MCH 30.3 26.6 - 33.0 pg   MCHC 33.0 31.5 - 35.7 g/dL   RDW 41.3 24.4 - 01.0 %   Platelets 288 150 - 450 x10E3/uL   Neutrophils 59 Not Estab. %   Lymphs 25 Not Estab. %   Monocytes 9 Not Estab. %   Eos 5 Not Estab. %   Basos 1 Not Estab. %   Neutrophils Absolute 5.1 1.4 - 7.0 x10E3/uL   Lymphocytes Absolute 2.2 0.7 - 3.1 x10E3/uL   Monocytes Absolute 0.8 0.1 - 0.9 x10E3/uL    EOS (ABSOLUTE) 0.5 (H) 0.0 - 0.4 x10E3/uL   Basophils Absolute 0.1 0.0 - 0.2 x10E3/uL   Immature Granulocytes 1 Not Estab. %   Immature Grans (Abs) 0.0 0.0 - 0.1 x10E3/uL  Comprehensive metabolic panel with GFR   Collection Time: 08/10/23 11:13 AM  Result Value Ref Range   Glucose 89 70 - 99 mg/dL   BUN 10 6 - 20 mg/dL   Creatinine, Ser 2.72 0.76 - 1.27 mg/dL   eGFR 536 >64 QI/HKV/4.25   BUN/Creatinine Ratio 13 9 - 20   Sodium 137 134 - 144 mmol/L   Potassium 4.7 3.5 - 5.2 mmol/L   Chloride 104 96 - 106 mmol/L   CO2 20 20 - 29 mmol/L   Calcium  9.4 8.7 - 10.2 mg/dL   Total Protein 6.9 6.0 - 8.5 g/dL   Albumin 4.5 4.1 - 5.1 g/dL   Globulin, Total 2.4 1.5 - 4.5 g/dL   Bilirubin Total <9.5 0.0 - 1.2 mg/dL   Alkaline Phosphatase 72 44 - 121 IU/L   AST 22 0 - 40 IU/L   ALT 36 0 - 44 IU/L  HgB A1c   Collection Time: 08/10/23 11:13 AM  Result Value Ref Range   Hgb A1c MFr Bld 5.4 4.8 - 5.6 %   Est. average glucose Bld gHb Est-mCnc 108 mg/dL      Assessment & Plan:   Problem List Items Addressed This Visit   None Visit Diagnoses       Nerve pain    -  Primary   LLE - will start Gabapentin.  Due to history of clot in left leg, recommend patient see Vascular to follow up on DVT due to new symptoms.        Follow up plan: Return in about 2 months (around 10/17/2023) for Leg pain.

## 2023-08-24 ENCOUNTER — Encounter: Payer: Self-pay | Admitting: Nurse Practitioner

## 2023-08-29 ENCOUNTER — Telehealth (INDEPENDENT_AMBULATORY_CARE_PROVIDER_SITE_OTHER): Payer: Self-pay | Admitting: Vascular Surgery

## 2023-08-29 NOTE — Telephone Encounter (Signed)
 Male who stated she was patient's aunt called to schedule pt appointment with Dr. Prescilla Brod. Patient already scheduled for appointment. Unable to disclose information or give appt info to male calling RE: appointment already scheduled. Advised male to have pt CB to discuss and schedule appointments. Aunt not listed on pt chart. Patient refused any Emergency contacts.

## 2023-08-31 ENCOUNTER — Ambulatory Visit (INDEPENDENT_AMBULATORY_CARE_PROVIDER_SITE_OTHER): Admitting: Vascular Surgery

## 2023-08-31 ENCOUNTER — Encounter (INDEPENDENT_AMBULATORY_CARE_PROVIDER_SITE_OTHER): Payer: Self-pay | Admitting: Vascular Surgery

## 2023-08-31 VITALS — BP 142/79 | HR 61 | Resp 18 | Ht 69.0 in | Wt 299.0 lb

## 2023-08-31 DIAGNOSIS — M79662 Pain in left lower leg: Secondary | ICD-10-CM | POA: Diagnosis not present

## 2023-08-31 DIAGNOSIS — M7989 Other specified soft tissue disorders: Secondary | ICD-10-CM

## 2023-08-31 DIAGNOSIS — K219 Gastro-esophageal reflux disease without esophagitis: Secondary | ICD-10-CM

## 2023-08-31 DIAGNOSIS — E782 Mixed hyperlipidemia: Secondary | ICD-10-CM | POA: Diagnosis not present

## 2023-08-31 DIAGNOSIS — I890 Lymphedema, not elsewhere classified: Secondary | ICD-10-CM | POA: Diagnosis not present

## 2023-08-31 DIAGNOSIS — Z86718 Personal history of other venous thrombosis and embolism: Secondary | ICD-10-CM | POA: Diagnosis not present

## 2023-09-02 ENCOUNTER — Encounter (INDEPENDENT_AMBULATORY_CARE_PROVIDER_SITE_OTHER): Payer: Self-pay | Admitting: Vascular Surgery

## 2023-09-02 DIAGNOSIS — M79662 Pain in left lower leg: Secondary | ICD-10-CM | POA: Insufficient documentation

## 2023-09-02 NOTE — Progress Notes (Signed)
 MRN : [id number]  Thomas Hopkins is a 38 y.o. (1985/04/13) male who presents with chief complaint of legs hurt and swell.  History of Present Illness:   The patient returns to the office for followup evaluation regarding left leg pain and swelling.  The swelling has persisted and seems to have gotten worse over the past month or so and the pain associated with swelling continues. There have not been any interval development of a ulcerations or wounds but he has developed a rash that seems to come and go.  Since the previous visit the patient has been wearing graduated compression stockings and has noted little if any improvement in the lymphedema. The patient has been using compression routinely morning until night.  The patient also states elevation during the day and exercise is being done too.  Current Meds  Medication Sig   apixaban  (ELIQUIS ) 5 MG TABS tablet Take 1 tablet (5 mg total) by mouth 2 (two) times daily.   aspirin  EC 81 MG tablet Take 81 mg by mouth daily. Swallow whole.   atorvastatin  (LIPITOR) 40 MG tablet TAKE 1 TABLET BY MOUTH EVERY DAY AFTER 6PM   gabapentin  (NEURONTIN ) 100 MG capsule Take 1 capsule (100 mg total) by mouth at bedtime.    Past Medical History:  Diagnosis Date   DVT of lower extremity, bilateral (HCC)    stopped Eliquis  in 2017   GERD (gastroesophageal reflux disease)    Hyperlipidemia     Past Surgical History:  Procedure Laterality Date   ABDOMINAL AORTOGRAM W/LOWER EXTREMITY Right 11/18/2016   Procedure: ABDOMINAL AORTOGRAM W/LOWER EXTREMITY;  Surgeon: Jama Cordella MATSU, MD;  Location: [facility];  Service: Cardiovascular;  Laterality: Right;   APPENDECTOMY     LAPAROSCOPIC APPENDECTOMY N/A 10/14/2014   Procedure: APPENDECTOMY LAPAROSCOPIC;  Surgeon: Charlie FORBES Fell, MD;  Location: [facility];  Service: General;  Laterality: N/A;   TONSILLECTOMY     WISDOM TOOTH EXTRACTION      Social History Social History    Tobacco Use   Smoking status: Every Day    Current packs/day: 0.25    Average packs/day: 0.3 packs/day for 15.0 years (3.8 ttl pk-yrs)    Types: Cigarettes   Smokeless tobacco: Never  Vaping Use   Vaping status: Never Used  Substance Use Topics   Alcohol use: Yes    Comment: very occasionally   Drug use: No    Family History Family History  Problem Relation Age of Onset   Diabetes Mother    Rectal cancer Father    Scoliosis Brother    Uterine cancer Maternal Aunt    Diabetes Mellitus II Maternal Grandmother    Hypertension Maternal Grandmother    Heart disease Maternal Grandfather     Allergies  Allergen Reactions   Bee Venom Hives   Morphine  Nausea Only   Vicodin [Hydrocodone -Acetaminophen ] Nausea And Vomiting    Headache   Hydrocodone -Acetaminophen  Nausea And Vomiting    Headache   Mushroom Extract Complex (Obsolete) Nausea And Vomiting   Olive Oil Nausea And Vomiting     REVIEW OF SYSTEMS (Negative unless checked)  Constitutional: [] Weight loss  [] Fever  [] Chills Cardiac: [] Chest pain   [] Chest pressure   [] Palpitations   [] Shortness of breath when laying flat   [] Shortness of breath with exertion. Vascular:  [] Pain in legs with walking   [x] Pain in legs at rest  [] History of DVT   [] Phlebitis   [x] Swelling in legs   []   Varicose veins   [] Non-healing ulcers Pulmonary:   [] Uses home oxygen   [] Productive cough   [] Hemoptysis   [] Wheeze  [] COPD   [] Asthma Neurologic:  [] Dizziness   [] Seizures   [] History of stroke   [] History of TIA  [] Aphasia   [] Vissual changes   [] Weakness or numbness in arm   [] Weakness or numbness in leg Musculoskeletal:   [] Joint swelling   [] Joint pain   [] Low back pain Hematologic:  [] Easy bruising  [] Easy bleeding   [] Hypercoagulable state   [] Anemic Gastrointestinal:  [] Diarrhea   [] Vomiting  [] Gastroesophageal reflux/heartburn   [] Difficulty swallowing. Genitourinary:  [] Chronic kidney disease   [] Difficult urination  [] Frequent  urination   [] Blood in urine Skin:  [] Rashes   [] Ulcers  Psychological:  [] History of anxiety   []  History of major depression.  Physical Examination  Vitals:   08/31/23 1518  BP: (!) 142/79  Pulse: 61  Resp: 18  Weight: 299 lb (135.6 kg)  Height: 5' 9 (1.753 m)   Body mass index is 44.15 kg/m. Gen: WD/WN, NAD Head: [place]/AT, No temporalis wasting.  Ear/Nose/Throat: Hearing grossly intact, nares w/o erythema or drainage, pinna without lesions Eyes: PER, EOMI, sclera nonicteric.  Neck: Supple, no gross masses.  No JVD.  Pulmonary:  Good air movement, no audible wheezing, no use of accessory muscles.  Cardiac: RRR, precordium not hyperdynamic. Vascular:  scattered varicosities present bilaterally.  Moderate venous stasis changes to the legs bilaterally.  4+ moderate to hard pitting edema. CEAP C4sEpAsPr   Vessel Right Left  Radial Palpable Palpable  Gastrointestinal: soft, non-distended. No guarding/no peritoneal signs.  Musculoskeletal: M/S 5/5 throughout.  No deformity.  Neurologic: CN 2-12 intact. Pain and light touch intact in extremities.  Symmetrical.  Speech is fluent. Motor exam as listed above. Psychiatric: Judgment intact, Mood & affect appropriate for pt's clinical situation. Dermatologic: Venous rashes no ulcers noted.  No changes consistent with cellulitis. Lymph : No lichenification or skin changes of chronic lymphedema.  CBC Lab Results  Component Value Date   WBC 8.7 08/10/2023   HGB 16.1 08/10/2023   HCT 48.8 08/10/2023   MCV 92 08/10/2023   PLT 288 08/10/2023    BMET    Component Value Date/Time   NA 137 08/10/2023 1113   K 4.7 08/10/2023 1113   CL 104 08/10/2023 1113   CO2 20 08/10/2023 1113   GLUCOSE 89 08/10/2023 1113   GLUCOSE 98 05/23/2023 0933   BUN 10 08/10/2023 1113   CREATININE 0.77 08/10/2023 1113   CALCIUM  9.4 08/10/2023 1113   GFRNONAA >60 05/23/2023 0933   GFRAA >60 11/28/2017 0834   CrCl cannot be calculated (Patient's most recent  lab result is older than the maximum 21 days allowed.).  COAG Lab Results  Component Value Date   INR 1.05 11/18/2016   INR 1.23 10/23/2014    Radiology No results found.   Assessment/Plan 1. Pain and swelling of left lower leg (Primary) Recommend:  No surgery or intervention at this point in time.   The Patient is CEAP C4sEpAsPr.  Given the abrupt change I will order a duplex ultrasound of the left lower extremity venous system.  The patient has been wearing compression for more than 12 weeks with no or little benefit.  The patient has been exercising daily for more than 12 weeks. The patient has been elevating and taking OTC pain medications for more than 12 weeks.  None of these have have eliminated the pain related to the lymphedema or  the discomfort regarding excessive swelling and venous congestion.    I have reviewed my discussion with the patient regarding lymphedema and why it  causes symptoms.  Patient will continue wearing graduated compression on a daily basis. The patient should put the compression on first thing in the morning and removing them in the evening. The patient should not sleep in the compression.   In addition, behavioral modification throughout the day will be continued.  This will include frequent elevation (such as in a recliner), use of over the counter pain medications as needed and exercise such as walking.  The systemic causes for chronic edema such as liver, kidney and cardiac etiologies do not appear to have significant changed over the past year.    The patient has chronic , severe lymphedema with hyperpigmentation of the skin and has done MLD, skin care, medication, diet, exercise, elevation and compression for 4 weeks with no improvement,  I am recommending a lymphedema pump.  The patient still has stage 3 lymphedema and therefore, I believe that a lymph pump is needed to improve the control of the patient's lymphedema and improve the quality of  life.  Additionally, a lymph pump is warranted because it will reduce the risk of cellulitis and ulceration in the future.  Patient should follow-up in six months  - VAS US  LOWER EXTREMITY VENOUS (DVT); Future  2. Lymphedema Recommend:  No surgery or intervention at this point in time.   The Patient is CEAP C4sEpAsPr.  The patient has been wearing compression for more than 12 weeks with no or little benefit.  The patient has been exercising daily for more than 12 weeks. The patient has been elevating and taking OTC pain medications for more than 12 weeks.  None of these have have eliminated the pain related to the lymphedema or the discomfort regarding excessive swelling and venous congestion.    I have reviewed my discussion with the patient regarding lymphedema and why it  causes symptoms.  Patient will continue wearing graduated compression on a daily basis. The patient should put the compression on first thing in the morning and removing them in the evening. The patient should not sleep in the compression.   In addition, behavioral modification throughout the day will be continued.  This will include frequent elevation (such as in a recliner), use of over the counter pain medications as needed and exercise such as walking.  The systemic causes for chronic edema such as liver, kidney and cardiac etiologies do not appear to have significant changed over the past year.    The patient has chronic , severe lymphedema with hyperpigmentation of the skin and has done MLD, skin care, medication, diet, exercise, elevation and compression for 4 weeks with no improvement,  I am recommending a lymphedema pump.  The patient still has stage 3 lymphedema and therefore, I believe that a lymph pump is needed to improve the control of the patient's lymphedema and improve the quality of life.  Additionally, a lymph pump is warranted because it will reduce the risk of cellulitis and ulceration in the  future.  Patient should follow-up in six months  - VAS US  LOWER EXTREMITY VENOUS (DVT); Future  3. Mixed hyperlipidemia Continue statin as ordered and reviewed, no changes at this time  4. History of DVT (deep vein thrombosis) Patient will continue anticoagulation lifelong - VAS US  LOWER EXTREMITY VENOUS (DVT); Future  5. Gastroesophageal reflux disease without esophagitis Continue PPI as already ordered, this medication has  been reviewed and there are no changes at this time.  Avoidence of caffeine and alcohol  Moderate elevation of the head of the bed     Cordella Shawl, MD  09/02/2023 10:31 AM

## 2023-09-07 ENCOUNTER — Ambulatory Visit (INDEPENDENT_AMBULATORY_CARE_PROVIDER_SITE_OTHER)

## 2023-09-07 DIAGNOSIS — M7989 Other specified soft tissue disorders: Secondary | ICD-10-CM | POA: Diagnosis not present

## 2023-09-07 DIAGNOSIS — M79662 Pain in left lower leg: Secondary | ICD-10-CM

## 2023-09-07 DIAGNOSIS — I890 Lymphedema, not elsewhere classified: Secondary | ICD-10-CM

## 2023-09-07 DIAGNOSIS — Z86718 Personal history of other venous thrombosis and embolism: Secondary | ICD-10-CM

## 2023-10-11 DIAGNOSIS — I87099 Postthrombotic syndrome with other complications of unspecified lower extremity: Secondary | ICD-10-CM | POA: Insufficient documentation

## 2023-10-11 NOTE — Progress Notes (Deleted)
 MRN : [id number]  Thomas Hopkins is a 38 y.o. (1985/09/07) male who presents with chief complaint of legs hurt and swell.  History of Present Illness:   Patient is seen sooner than expected for evaluation of DVT.  The most recent duplex ultrasound dated 12/12/2022 shows chronic tibial DVT which was unchanged from the previous scan.  I do not find any new scans or imaging in the [facility] system or under care everywhere.  No outpatient medications have been marked as taking for the 10/12/23 encounter (Appointment) with Jama, Cordella MATSU, MD.    Past Medical History:  Diagnosis Date   DVT of lower extremity, bilateral (HCC)    stopped Eliquis  in 2017   GERD (gastroesophageal reflux disease)    Hyperlipidemia     Past Surgical History:  Procedure Laterality Date   ABDOMINAL AORTOGRAM W/LOWER EXTREMITY Right 11/18/2016   Procedure: ABDOMINAL AORTOGRAM W/LOWER EXTREMITY;  Surgeon: Jama Cordella MATSU, MD;  Location: [facility];  Service: Cardiovascular;  Laterality: Right;   APPENDECTOMY     LAPAROSCOPIC APPENDECTOMY N/A 10/14/2014   Procedure: APPENDECTOMY LAPAROSCOPIC;  Surgeon: Charlie FORBES Fell, MD;  Location: [facility];  Service: General;  Laterality: N/A;   TONSILLECTOMY     WISDOM TOOTH EXTRACTION      Social History Social History   Tobacco Use   Smoking status: Every Day    Current packs/day: 0.25    Average packs/day: 0.3 packs/day for 15.0 years (3.8 ttl pk-yrs)    Types: Cigarettes   Smokeless tobacco: Never  Vaping Use   Vaping status: Never Used  Substance Use Topics   Alcohol use: Yes    Comment: very occasionally   Drug use: No    Family History Family History  Problem Relation Age of Onset   Diabetes Mother    Rectal cancer Father    Scoliosis Brother    Uterine cancer Maternal Aunt    Diabetes Mellitus II Maternal Grandmother    Hypertension Maternal Grandmother    Heart disease Maternal Grandfather     Allergies  Allergen  Reactions   Bee Venom Hives   Morphine  Nausea Only   Vicodin [Hydrocodone -Acetaminophen ] Nausea And Vomiting    Headache   Hydrocodone -Acetaminophen  Nausea And Vomiting    Headache   Mushroom Extract Complex (Obsolete) Nausea And Vomiting   Olive Oil Nausea And Vomiting     REVIEW OF SYSTEMS (Negative unless checked)  Constitutional: [] Weight loss  [] Fever  [] Chills Cardiac: [] Chest pain   [] Chest pressure   [] Palpitations   [] Shortness of breath when laying flat   [] Shortness of breath with exertion. Vascular:  [] Pain in legs with walking   [x] Pain in legs at rest  [] History of DVT   [] Phlebitis   [x] Swelling in legs   [] Varicose veins   [] Non-healing ulcers Pulmonary:   [] Uses home oxygen   [] Productive cough   [] Hemoptysis   [] Wheeze  [] COPD   [] Asthma Neurologic:  [] Dizziness   [] Seizures   [] History of stroke   [] History of TIA  [] Aphasia   [] Vissual changes   [] Weakness or numbness in arm   [] Weakness or numbness in leg Musculoskeletal:   [] Joint swelling   [] Joint pain   [] Low back pain Hematologic:  [] Easy bruising  [] Easy bleeding   [] Hypercoagulable state   [] Anemic Gastrointestinal:  [] Diarrhea   [] Vomiting  [x] Gastroesophageal reflux/heartburn   [] Difficulty swallowing. Genitourinary:  [] Chronic kidney disease   [] Difficult urination  [] Frequent urination   []   Blood in urine Skin:  [] Rashes   [] Ulcers  Psychological:  [] History of anxiety   []  History of major depression.  Physical Examination  There were no vitals filed for this visit. There is no height or weight on file to calculate BMI. Gen: WD/WN, NAD Head: [place]/AT, No temporalis wasting.  Ear/Nose/Throat: Hearing grossly intact, nares w/o erythema or drainage, pinna without lesions Eyes: PER, EOMI, sclera nonicteric.  Neck: Supple, no gross masses.  No JVD.  Pulmonary:  Good air movement, no audible wheezing, no use of accessory muscles.  Cardiac: RRR, precordium not hyperdynamic. Vascular:  scattered varicosities  present bilaterally.  Moderate venous stasis changes to the legs bilaterally.  2+ soft pitting edema. CEAP C4sEpAsPr   Vessel Right Left  Radial Palpable Palpable  Gastrointestinal: soft, non-distended. No guarding/no peritoneal signs.  Musculoskeletal: M/S 5/5 throughout.  No deformity.  Neurologic: CN 2-12 intact. Pain and light touch intact in extremities.  Symmetrical.  Speech is fluent. Motor exam as listed above. Psychiatric: Judgment intact, Mood & affect appropriate for pt's clinical situation. Dermatologic: Venous rashes no ulcers noted.  No changes consistent with cellulitis. Lymph : No lichenification or skin changes of chronic lymphedema.  CBC Lab Results  Component Value Date   WBC 8.7 08/10/2023   HGB 16.1 08/10/2023   HCT 48.8 08/10/2023   MCV 92 08/10/2023   PLT 288 08/10/2023    BMET    Component Value Date/Time   NA 137 08/10/2023 1113   K 4.7 08/10/2023 1113   CL 104 08/10/2023 1113   CO2 20 08/10/2023 1113   GLUCOSE 89 08/10/2023 1113   GLUCOSE 98 05/23/2023 0933   BUN 10 08/10/2023 1113   CREATININE 0.77 08/10/2023 1113   CALCIUM  9.4 08/10/2023 1113   GFRNONAA >60 05/23/2023 0933   GFRAA >60 11/28/2017 0834   CrCl cannot be calculated (Patient's most recent lab result is older than the maximum 21 days allowed.).  COAG Lab Results  Component Value Date   INR 1.05 11/18/2016   INR 1.23 10/23/2014    Radiology No results found.   Assessment/Plan There are no diagnoses linked to this encounter.   Cordella Shawl, MD  10/11/2023 7:40 AM

## 2023-10-12 ENCOUNTER — Ambulatory Visit (INDEPENDENT_AMBULATORY_CARE_PROVIDER_SITE_OTHER): Admitting: Vascular Surgery

## 2023-10-12 DIAGNOSIS — I87099 Postthrombotic syndrome with other complications of unspecified lower extremity: Secondary | ICD-10-CM

## 2023-10-12 DIAGNOSIS — I872 Venous insufficiency (chronic) (peripheral): Secondary | ICD-10-CM

## 2023-10-12 DIAGNOSIS — E782 Mixed hyperlipidemia: Secondary | ICD-10-CM

## 2023-10-12 DIAGNOSIS — K219 Gastro-esophageal reflux disease without esophagitis: Secondary | ICD-10-CM

## 2023-10-18 ENCOUNTER — Encounter: Payer: Self-pay | Admitting: Nurse Practitioner

## 2023-10-18 ENCOUNTER — Ambulatory Visit: Admitting: Nurse Practitioner

## 2023-10-18 VITALS — BP 138/78 | HR 54 | Temp 97.9°F | Ht 69.0 in | Wt 301.4 lb

## 2023-10-18 DIAGNOSIS — M7989 Other specified soft tissue disorders: Secondary | ICD-10-CM

## 2023-10-18 DIAGNOSIS — M79662 Pain in left lower leg: Secondary | ICD-10-CM

## 2023-10-18 DIAGNOSIS — G5602 Carpal tunnel syndrome, left upper limb: Secondary | ICD-10-CM | POA: Diagnosis not present

## 2023-10-18 NOTE — Assessment & Plan Note (Signed)
 Chronic.  Diagnosed with Lymphadema.  Recommend leg pump.  Discussed lifestyle changes.  Continue to follow up with vascular.

## 2023-10-18 NOTE — Progress Notes (Signed)
 BP 138/78   Pulse (!) 54   Temp 97.9 F (36.6 C) (Oral)   Ht 5' 9 (1.753 m)   Wt (!) 301 lb 6.4 oz (136.7 kg)   SpO2 96%   BMI 44.51 kg/m    Subjective:    Patient ID: Thomas Hopkins, male    DOB: Feb 17, 1986, 38 y.o.   MRN: [id number]  HPI: Thomas Hopkins is a 38 y.o. male  Chief Complaint  Patient presents with   Leg Pain    2 month follow up. Pt states he is still have some pain and quite a bit of swelling. Along with a burif sensation. Left leg.    Patient states he has been having a burning sensation in his left leg.  Has been diagnosed with Lymphadema.  Vascular has ordered a leg pump and encouraged lifestyle changes.  Did have on occlusive DVT in left femoral/popliteal- he is on Eliquis .  Patient states he has carpal tunnel in his left arm.  He has difficulty with weed eating at work.  He does try to wear a brace at night without improvement in symptoms.    Relevant past medical, surgical, family and social history reviewed and updated as indicated. Interim medical history since our last visit reviewed. Allergies and medications reviewed and updated.  Review of Systems  Musculoskeletal:        Left leg pain  Neurological:  Positive for numbness.    Per HPI unless specifically indicated above     Objective:     BP 138/78   Pulse (!) 54   Temp 97.9 F (36.6 C) (Oral)   Ht 5' 9 (1.753 m)   Wt (!) 301 lb 6.4 oz (136.7 kg)   SpO2 96%   BMI 44.51 kg/m   Wt Readings from Last 3 Encounters:  10/18/23 (!) 301 lb 6.4 oz (136.7 kg)  08/31/23 299 lb (135.6 kg)  08/17/23 (!) 303 lb (137.4 kg)    Physical Exam Vitals and nursing note reviewed.  Constitutional:      General: He is not in acute distress.    Appearance: Normal appearance. He is not ill-appearing, toxic-appearing or diaphoretic.  HENT:     Head: Normocephalic.     Right Ear: External ear normal.     Left Ear: External ear normal.     Nose: Nose normal. No congestion or  rhinorrhea.     Mouth/Throat:     Mouth: Mucous membranes are moist.  Eyes:     General:        Right eye: No discharge.        Left eye: No discharge.     Extraocular Movements: Extraocular movements intact.     Conjunctiva/sclera: Conjunctivae normal.     Pupils: Pupils are equal, round, and reactive to light.  Cardiovascular:     Rate and Rhythm: Normal rate and regular rhythm.     Heart sounds: No murmur heard. Pulmonary:     Effort: Pulmonary effort is normal. No respiratory distress.     Breath sounds: Normal breath sounds. No wheezing, rhonchi or rales.  Abdominal:     General: Abdomen is flat. Bowel sounds are normal.  Musculoskeletal:     Cervical back: Normal range of motion and neck supple.  Skin:    General: Skin is warm and dry.     Capillary Refill: Capillary refill takes less than 2 seconds.  Neurological:     General: No focal deficit present.  Mental Status: He is alert and oriented to person, place, and time.  Psychiatric:        Mood and Affect: Mood normal.        Behavior: Behavior normal.        Thought Content: Thought content normal.        Judgment: Judgment normal.     Results for orders placed or performed in visit on 08/10/23  TSH   Collection Time: 08/10/23 11:13 AM  Result Value Ref Range   TSH 1.850 0.450 - 4.500 uIU/mL  Lipid panel   Collection Time: 08/10/23 11:13 AM  Result Value Ref Range   Cholesterol, Total 186 100 - 199 mg/dL   Triglycerides 845 (H) 0 - 149 mg/dL   HDL 39 (L) >60 mg/dL   VLDL Cholesterol Cal 27 5 - 40 mg/dL   LDL Chol Calc (NIH) 879 (H) 0 - 99 mg/dL   Chol/HDL Ratio 4.8 0.0 - 5.0 ratio  CBC with Differential/Platelet   Collection Time: 08/10/23 11:13 AM  Result Value Ref Range   WBC 8.7 3.4 - 10.8 x10E3/uL   RBC 5.31 4.14 - 5.80 x10E6/uL   Hemoglobin 16.1 13.0 - 17.7 g/dL   Hematocrit 51.1 62.4 - 51.0 %   MCV 92 79 - 97 fL   MCH 30.3 26.6 - 33.0 pg   MCHC 33.0 31.5 - 35.7 g/dL   RDW 87.2 88.3 - 84.5 %    Platelets 288 150 - 450 x10E3/uL   Neutrophils 59 Not Estab. %   Lymphs 25 Not Estab. %   Monocytes 9 Not Estab. %   Eos 5 Not Estab. %   Basos 1 Not Estab. %   Neutrophils Absolute 5.1 1.4 - 7.0 x10E3/uL   Lymphocytes Absolute 2.2 0.7 - 3.1 x10E3/uL   Monocytes Absolute 0.8 0.1 - 0.9 x10E3/uL   EOS (ABSOLUTE) 0.5 (H) 0.0 - 0.4 x10E3/uL   Basophils Absolute 0.1 0.0 - 0.2 x10E3/uL   Immature Granulocytes 1 Not Estab. %   Immature Grans (Abs) 0.0 0.0 - 0.1 x10E3/uL  Comprehensive metabolic panel with GFR   Collection Time: 08/10/23 11:13 AM  Result Value Ref Range   Glucose 89 70 - 99 mg/dL   BUN 10 6 - 20 mg/dL   Creatinine, Ser 9.22 0.76 - 1.27 mg/dL   eGFR 881 >40 fO/fpw/8.26   BUN/Creatinine Ratio 13 9 - 20   Sodium 137 134 - 144 mmol/L   Potassium 4.7 3.5 - 5.2 mmol/L   Chloride 104 96 - 106 mmol/L   CO2 20 20 - 29 mmol/L   Calcium  9.4 8.7 - 10.2 mg/dL   Total Protein 6.9 6.0 - 8.5 g/dL   Albumin 4.5 4.1 - 5.1 g/dL   Globulin, Total 2.4 1.5 - 4.5 g/dL   Bilirubin Total <9.7 0.0 - 1.2 mg/dL   Alkaline Phosphatase 72 44 - 121 IU/L   AST 22 0 - 40 IU/L   ALT 36 0 - 44 IU/L  HgB A1c   Collection Time: 08/10/23 11:13 AM  Result Value Ref Range   Hgb A1c MFr Bld 5.4 4.8 - 5.6 %   Est. average glucose Bld gHb Est-mCnc 108 mg/dL      Assessment & Plan:   Problem List Items Addressed This Visit       Other   Pain and swelling of left lower leg - Primary   Chronic.  Diagnosed with Lymphadema.  Recommend leg pump.  Discussed lifestyle changes.  Continue to follow up  with vascular.      Other Visit Diagnoses       Carpal tunnel syndrome of left wrist       Referral placed for Ortho.   Relevant Orders   Ambulatory referral to Orthopedic Surgery         Follow up plan: Return in about 3 months (around 01/18/2024) for HTN, HLD, DM2 FU.

## 2023-11-07 ENCOUNTER — Emergency Department
Admission: EM | Admit: 2023-11-07 | Discharge: 2023-11-07 | Disposition: A | Discharge status: Home / Self Care | Attending: Emergency Medicine | Admitting: Emergency Medicine

## 2023-11-07 ENCOUNTER — Emergency Department

## 2023-11-07 ENCOUNTER — Other Ambulatory Visit: Payer: Self-pay

## 2023-11-07 DIAGNOSIS — R079 Chest pain, unspecified: Secondary | ICD-10-CM | POA: Diagnosis present

## 2023-11-07 DIAGNOSIS — Z7901 Long term (current) use of anticoagulants: Secondary | ICD-10-CM | POA: Diagnosis not present

## 2023-11-07 DIAGNOSIS — G8929 Other chronic pain: Secondary | ICD-10-CM | POA: Diagnosis not present

## 2023-11-07 DIAGNOSIS — R519 Headache, unspecified: Secondary | ICD-10-CM | POA: Diagnosis not present

## 2023-11-07 LAB — CBC
HCT: 45.5 % (ref 39.0–52.0)
Hemoglobin: 15.1 g/dL (ref 13.0–17.0)
MCH: 29.7 pg (ref 26.0–34.0)
MCHC: 33.2 g/dL (ref 30.0–36.0)
MCV: 89.4 fL (ref 80.0–100.0)
Platelets: 274 K/uL (ref 150–400)
RBC: 5.09 MIL/uL (ref 4.22–5.81)
RDW: 13.1 % (ref 11.5–15.5)
WBC: 8.1 K/uL (ref 4.0–10.5)
nRBC: 0 % (ref 0.0–0.2)

## 2023-11-07 LAB — BASIC METABOLIC PANEL WITH GFR
Anion gap: 8 (ref 5–15)
BUN: 9 mg/dL (ref 6–20)
CO2: 24 mmol/L (ref 22–32)
Calcium: 9 mg/dL (ref 8.9–10.3)
Chloride: 106 mmol/L (ref 98–111)
Creatinine, Ser: 0.78 mg/dL (ref 0.61–1.24)
GFR, Estimated: >60 mL/min (ref >60)
Glucose, Bld: 128 mg/dL — ABNORMAL HIGH (ref 70–99)
Potassium: 3.8 mmol/L (ref 3.5–5.1)
Sodium: 138 mmol/L (ref 135–145)

## 2023-11-07 LAB — PROTIME-INR
INR: 1 (ref 0.8–1.2)
Prothrombin Time: 14.1 s (ref 11.4–15.2)

## 2023-11-07 LAB — RESP PANEL BY RT-PCR (RSV, FLU A&B, COVID)  RVPGX2
Influenza A by PCR: NEGATIVE
Influenza B by PCR: NEGATIVE
Resp Syncytial Virus by PCR: NEGATIVE
SARS Coronavirus 2 by RT PCR: NEGATIVE

## 2023-11-07 LAB — TROPONIN I (HIGH SENSITIVITY)
Troponin I (High Sensitivity): 4 ng/L (ref <18)
Troponin I (High Sensitivity): 3 ng/L (ref <18)

## 2023-11-07 MED ORDER — IOHEXOL 350 MG/ML SOLN
75.0000 mL | Freq: Once | INTRAVENOUS | Status: AC | PRN
  Administered 2023-11-07: 75 mL via INTRAVENOUS

## 2023-11-07 NOTE — ED Triage Notes (Signed)
 Pt comes in via pov with complaints of chest pain over the past month. Pt states that the pain get worse with working, and he is concerned as he has a history of blood clots. Pt complains of chest pressure 2/10. Pt also complains if chronic headaches, and has pain now 2/10. Pt is currently on eliquis  at this time. Pt has no complaints of shortness of breath at this time. Pt with no signs of acute distress at this time.

## 2023-11-07 NOTE — ED Provider Notes (Signed)
 [facility] Provider Note    Event Date/Time   First MD Initiated Contact with Patient 11/07/23 1214     (approximate)   History   Chief Complaint Chest Pain   HPI  Thomas Hopkins is a 38 y.o. male with past medical history of hyperlipidemia, PAD, and DVT on Eliquis  who presents to the ED complaining of chest pain.  Patient reports that he has been dealing with about 1 month of intermittent pressure in the center of his chest.  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  He denies significant chest pain or shortness of breath currently, does state that he has been dealing with worsening intermittent headache over the past month as well.  He does report a dry cough over the past week, denies any fevers.  He has chronic swelling in his legs that is unchanged, reports history of DVT and is currently taking Eliquis , admits to missing some doses last week.  He denies any neck stiffness, vision changes, speech changes, numbness, or weakness in his extremities.     Physical Exam   Triage Vital Signs: ED Triage Vitals  Encounter Vitals Group     BP 11/07/23 1139 129/76     Girls Systolic BP Percentile --      Girls Diastolic BP Percentile --      Boys Systolic BP Percentile --      Boys Diastolic BP Percentile --      Pulse Rate 11/07/23 1139 (!) 54     Resp 11/07/23 1139 18     Temp 11/07/23 1139 98.6 F (37 C)     Temp src --      SpO2 11/07/23 1139 98 %     Weight 11/07/23 1135 (!) 310 lb (140.6 kg)     Height 11/07/23 1135 5' 9 (1.753 m)     Head Circumference --      Peak Flow --      Pain Score 11/07/23 1135 2     Pain Loc --      Pain Education --      Exclude from Growth Chart --     Most recent vital signs: Vitals:   11/07/23 1139  BP: 129/76  Pulse: (!) 54  Resp: 18  Temp: 98.6 F (37 C)  SpO2: 98%    Constitutional: Alert and  oriented. Eyes: Conjunctivae are normal. Head: Atraumatic. Nose: No congestion/rhinnorhea. Mouth/Throat: Mucous membranes are moist.  Neck: Supple with no meningismus. Cardiovascular: Normal rate, regular rhythm. Grossly normal heart sounds.  2+ radial pulses bilaterally. Respiratory: Normal respiratory effort.  No retractions. Lungs CTAB.  No chest wall tenderness to palpation. Gastrointestinal: Soft and nontender. No distention. Musculoskeletal: Firm edema to the knees bilaterally, chronic per patient without associated erythema or tenderness. Neurologic:  Normal speech and language. No gross focal neurologic deficits are appreciated.    ED Results / Procedures / Treatments   Labs (all labs ordered are listed, but only abnormal results are displayed) Labs Reviewed  BASIC METABOLIC PANEL WITH GFR - Abnormal; Notable for the following components:      Result Value   Glucose, Bld 128 (*)    All other components within normal limits  RESP PANEL BY RT-PCR (RSV, FLU A&B, COVID)  RVPGX2  CBC  PROTIME-INR  TROPONIN I (HIGH SENSITIVITY)  TROPONIN I (HIGH SENSITIVITY)     EKG  ED ECG  REPORT I, Carlin Palin, the attending physician, personally viewed and interpreted this ECG.   Date: 11/07/2023  EKG Time: 11:40  Rate: 49  Rhythm: sinus bradycardia  Axis: Normal  Intervals:none  ST&T Change: None  RADIOLOGY Chest x-ray reviewed and interpreted by me with no infiltrate, edema, or effusion.  PROCEDURES:  Critical Care performed: No  Procedures   MEDICATIONS ORDERED IN ED: Medications  iohexol  (OMNIPAQUE ) 350 MG/ML injection 75 mL (75 mLs Intravenous Contrast Given 11/07/23 1314)     IMPRESSION / MDM / ASSESSMENT AND PLAN / ED COURSE  I reviewed the triage vital signs and the nursing notes.                              38 y.o. male with past medical history of hyperlipidemia, PAD, and DVT on Eliquis  who presents to the ED complaining of intermittent chest pain  with exertion over the past month along with worsening intermittent headache.  Patient's presentation is most consistent with acute presentation with potential threat to life or bodily function.  Differential diagnosis includes, but is not limited to, ACS, PE, pneumonia, pneumothorax, musculoskeletal pain, GERD, anxiety, SAH, meningitis, tension headache, migraine headache.  Patient nontoxic-appearing and in no acute distress, vital signs remarkable for borderline bradycardia but otherwise reassuring.  EKG shows no evidence of arrhythmia or ischemia and troponin within normal limits, given intermittent symptoms will check second set troponin.  Additional labs without significant anemia, leukocytosis, electrolyte abnormality, or AKI and chest x-ray is unremarkable.  We will further assess with CTA of his chest to rule out PE, also check CT head with increasing frequency and severity of his headaches.  He has no focal neurologic deficits on exam and no features concerning for [facility] or meningitis.  CT head is negative for acute process, CTA chest also unremarkable with no evidence of PE.  2 sets of troponin are within normal limits and patient with no active chest pain at this time.  He is appropriate for discharge home with outpatient cardiology follow-up, referral provided.  We will also refer to neurology given his worsening chronic headaches.  He was counseled to return to the ED for new or worsening symptoms, patient agrees with plan.      FINAL CLINICAL IMPRESSION(S) / ED DIAGNOSES   Final diagnoses:  Nonspecific chest pain  Chronic nonintractable headache, unspecified headache type     Rx / DC Orders   ED Discharge Orders          Ordered    Ambulatory referral to Cardiology        11/07/23 1507             Note:  This document was prepared using Dragon voice recognition software and may include unintentional dictation errors.   Palin Carlin, MD 11/07/23 [phone]

## 2023-11-24 ENCOUNTER — Encounter: Payer: Self-pay | Admitting: Cardiology

## 2023-11-24 ENCOUNTER — Ambulatory Visit: Attending: Cardiology | Admitting: Cardiology

## 2023-11-24 VITALS — BP 128/74 | HR 56 | Ht 69.0 in | Wt 296.6 lb

## 2023-11-24 DIAGNOSIS — R079 Chest pain, unspecified: Secondary | ICD-10-CM | POA: Diagnosis present

## 2023-11-24 DIAGNOSIS — E782 Mixed hyperlipidemia: Secondary | ICD-10-CM | POA: Insufficient documentation

## 2023-11-24 DIAGNOSIS — R001 Bradycardia, unspecified: Secondary | ICD-10-CM | POA: Insufficient documentation

## 2023-11-24 DIAGNOSIS — R600 Localized edema: Secondary | ICD-10-CM | POA: Diagnosis present

## 2023-11-24 DIAGNOSIS — R0602 Shortness of breath: Secondary | ICD-10-CM | POA: Insufficient documentation

## 2023-11-24 DIAGNOSIS — I890 Lymphedema, not elsewhere classified: Secondary | ICD-10-CM | POA: Diagnosis present

## 2023-11-24 DIAGNOSIS — I82503 Chronic embolism and thrombosis of unspecified deep veins of lower extremity, bilateral: Secondary | ICD-10-CM | POA: Insufficient documentation

## 2023-11-24 NOTE — Patient Instructions (Signed)
 Medication Instructions:  Your physician recommends that you continue on your current medications as directed. Please refer to the Current Medication list given to you today.   *If you need a refill on your cardiac medications before your next appointment, please call your pharmacy*  Lab Work: No labs ordered today  If you have labs (blood work) drawn today and your tests are completely normal, you will receive your results only by: MyChart Message (if you have MyChart) OR A paper copy in the mail If you have any lab test that is abnormal or we need to change your treatment, we will call you to review the results.  Testing/Procedures: Your physician has requested that you have an echocardiogram. Echocardiography is a painless test that uses sound waves to create images of your heart. It provides your doctor with information about the size and shape of your heart and how well your heart's chambers and valves are working.   You may receive an ultrasound enhancing agent through an IV if needed to better visualize your heart during the echo. This procedure takes approximately one hour.  There are no restrictions for this procedure.  This will take place at [address] [facility] [address] [facility], [place] [address]  Please note: We ask at that you not bring children with you during ultrasound (echo/ vascular) testing. Due to room size and safety concerns, children are not allowed in the ultrasound rooms during exams. Our front office staff cannot provide observation of children in our lobby area while testing is being conducted. An adult accompanying a patient to their appointment will only be allowed in the ultrasound room at the discretion of the ultrasound technician under special circumstances. We apologize for any inconvenience.   Your provider has ordered a Lexiscan/ Exercise Myoview Stress test. This will take place at [facility]. Please report to the [facility] medical mall entrance. The  volunteers at the first desk will direct you where to go.  [facility] MYOVIEW  Your provider has ordered a Stress Test with nuclear imaging. The purpose of this test is to evaluate the blood supply to your heart muscle. This procedure is referred to as a Non-Invasive Stress Test. This is because other than having an IV started in your vein, nothing is inserted or invades your body. Cardiac stress tests are done to find areas of poor blood flow to the heart by determining the extent of coronary artery disease (CAD). Some patients exercise on a treadmill, which naturally increases the blood flow to your heart, while others who are unable to walk on a treadmill due to physical limitations will have a pharmacologic/chemical stress agent called Lexiscan . This medicine will mimic walking on a treadmill by temporarily increasing your coronary blood flow.   Please note: these test may take anywhere between 2-4 hours to complete  How to prepare for your Myoview test:  Nothing to eat for 6 hours prior to the test No caffeine for 24 hours prior to test No smoking 24 hours prior to test. Your medication may be taken with water.  If your doctor stopped a medication because of this test, do not take that medication. Ladies, please do not wear dresses.  Skirts or pants are appropriate. Please wear a short sleeve shirt. No perfume, cologne or lotion. Wear comfortable walking shoes. No heels!   PLEASE NOTIFY THE OFFICE AT LEAST 24 HOURS IN ADVANCE IF YOU ARE UNABLE TO KEEP YOUR APPOINTMENT.  [phone] AND  PLEASE NOTIFY NUCLEAR MEDICINE AT  [facility] AT LEAST 24 HOURS IN ADVANCE IF YOU ARE UNABLE TO KEEP YOUR APPOINTMENT. [phone]   Follow-Up: At [facility], you and your health needs are our priority.  As part of our continuing mission to provide you with exceptional heart care, our providers are all part of one team.  This team includes your primary Cardiologist (physician) and Advanced Practice  Providers or APPs (Physician Assistants and Nurse Practitioners) who all work together to provide you with the care you need, when you need it.  Your next appointment:   8 week(s)  Provider:   You may see one of the following Advanced Practice Providers on your designated Care Team:   Lonni Meager, NP Lesley Maffucci, PA-C Bernardino Bring, PA-C Cadence Big [place], PA-C Tylene Lunch, NP Barnie Hila, NP

## 2023-11-24 NOTE — Progress Notes (Signed)
 Cardiology Office Note   Date:  11/24/2023  ID:  Thomas Hopkins, DOB 11-18-1985, MRN [id number] PCP: Melvin Pao, NP  [facility] Providers Cardiologist:  None Cardiology APP:  Gerard Frederick, NP     History of Present Illness Thomas Hopkins is a 38 y.o. male with a past medical history of hyperlipidemia, peripheral arterial disease, and history recurrent bilateral DVT, lymphedema, GERD, who presents today to establish care after recent visit to the emergency department complaining of chest pain.  Patient has previously been followed by vascular regarding left leg pain and swelling.  He is also being treated for lymphedema where he has been wearing graduated compression stockings and noted little improvement.  He continues to have pain and swelling to the left lower extremity and a venous duplex was ordered to rule out DVT.  There were no other medication changes that were made.  He was then evaluated in the [facility] emergency department on 11/07/2023 with complaint of chest pain.  Reportedly been dealing with it for about a month and intermittent pressure in the center of his chest.  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  He denies significant chest pain or shortness of breath on evaluation in the emergency department stated had been dealing with it and it was worsening.  He also had a headache over the last month.  Reported dry cough over the past week but denied any fevers.  He has chronic swelling in his legs that is unchanged.  History of DVT to the left leg currently taking Eliquis  but admits to missing some doses.  Blood pressure was 129/76, pulse 54, respirations of 18, temperature 98.6.  EKG showed no evidence of arrhythmia or ischemia troponin was within normal limits.  CT of the chest to rule out PE was ordered also CT of the head with increasing frequency and  severity and headaches.  CT of the head was negative for acute process.  CT of the chest also unremarkable with no evidence of PE.  2 sets of troponin within normal limits and the patient had no active chest pain at that time.  He was considered stable for discharge and a referral to cardiology was made.  He presents to clinic today with complaint of chest discomfort that he states has been intermittent in the center of his chest that is worse with exertion as he works for a [employer].  Its typically well he has moving around and not always with rest.  He has also been suffering from headaches and associated shortness of breath.  He continues to be followed by vascular for his lymphedema which he does not have compression stockings on today.  He states he continues to have peripheral edema that has not improved with wearing compression therapy.  States he has been somewhat compliant with his blood thinner as he takes it at least once daily but often forgets the second dose of apixaban  in the evening.  Denies any lightheadedness, dizziness, syncopal or near syncopal episodes.  Sinus bradycardia at baseline.  ROS: 10 point review of system has been reviewed and considered negative except ones been listed in the HPI  Studies Reviewed EKG Interpretation Date/Time:  Friday November 24 2023 13:59:08 EDT Ventricular Rate:  56 PR Interval:  158 QRS Duration:  92 QT Interval:  408 QTC Calculation: 393 R Axis:   1  Text Interpretation: Sinus bradycardia Minimal  voltage criteria for LVH, may be normal variant ( R in aVL ) When compared with ECG of 07-Nov-2023 11:40, No significant change was found Confirmed by Gerard Frederick ([id number]) on 11/24/2023 2:01:09 PM    Risk Assessment/Calculations           Physical Exam VS:  BP 128/74   Pulse (!) 56   Ht 5' 9 (1.753 m)   Wt 296 lb 9.6 oz (134.5 kg)   SpO2 96%   BMI 43.80 kg/m        Wt Readings from Last 3 Encounters:  11/24/23 296 lb 9.6 oz  (134.5 kg)  11/07/23 (!) 308 lb 10.3 oz (140 kg)  10/18/23 (!) 301 lb 6.4 oz (136.7 kg)    GEN: Well nourished, well developed in no acute distress NECK: No JVD; No carotid bruits CARDIAC: RRR, no murmurs, rubs, gallops RESPIRATORY:  Clear to auscultation without rales, wheezing or rhonchi  ABDOMEN: Soft, non-tender, non-distended EXTREMITIES:  No edema; No deformity   ASSESSMENT AND PLAN Chest pain that has been ongoing for the last several months.  Primarily with exertion and sometimes at rest.  EKG today reveals sinus bradycardia with rate of 56, LVH, with no ischemic changes noted.  He has been scheduled for a Lexiscan Myoview for the ongoing chest discomfort.  Treadmill testing was not pursued and due to lymphedema and chronic bilateral DVT with pain to the bilateral lower extremities.  Shortness of breath that has been progressive with a recent CT of the chest negative for PE.  He has been scheduled for an echocardiogram to rule out functional or structural abnormalities.   Hyperlipidemia with an LDL of 120.  He is continued on 40 mg of atorvastatin .  Ongoing management per his PCP.  Peripheral edema with lymphedema that continues to be followed by [facility].  Recurrent DVTs were he is on chronic apixaban  5 mg twice daily.  With his lack of adherence to apixaban  discussed changing him to Xarelto for daily dosing.  Since bradycardia noted on EKG where he remains asymptomatic.  He is chronotropically appropriate.  If he becomes symptomatic we will consider ZIO XT monitor.  Will continue to monitor with surveillance studies.    Informed Consent   Shared Decision Making/Informed Consent The risks [chest pain, shortness of breath, cardiac arrhythmias, dizziness, blood pressure fluctuations, myocardial infarction, stroke/transient ischemic attack, nausea, vomiting, allergic reaction, radiation exposure, metallic taste sensation and life-threatening complications (estimated to be 1 in 10,000)],  benefits (risk stratification, diagnosing coronary artery disease, treatment guidance) and alternatives of a nuclear stress test were discussed in detail with Mr. Santana and he agrees to proceed.     Dispo: Patient to return to clinic to see MD/APP once testing is completed or sooner if needed  Signed, Deshonna Trnka, NP

## 2023-12-01 ENCOUNTER — Other Ambulatory Visit: Payer: Self-pay

## 2023-12-01 DIAGNOSIS — M25532 Pain in left wrist: Secondary | ICD-10-CM

## 2023-12-01 DIAGNOSIS — M25531 Pain in right wrist: Secondary | ICD-10-CM

## 2023-12-05 ENCOUNTER — Ambulatory Visit
Admission: RE | Admit: 2023-12-05 | Discharge: 2023-12-05 | Disposition: A | Discharge status: Home / Self Care | Attending: Orthopedic Surgery | Admitting: Orthopedic Surgery

## 2023-12-05 ENCOUNTER — Ambulatory Visit
Admission: RE | Admit: 2023-12-05 | Discharge: 2023-12-05 | Disposition: A | Discharge status: Home / Self Care | Source: Ambulatory Visit | Attending: Orthopedic Surgery

## 2023-12-05 DIAGNOSIS — M25531 Pain in right wrist: Secondary | ICD-10-CM

## 2023-12-05 DIAGNOSIS — M25532 Pain in left wrist: Secondary | ICD-10-CM

## 2023-12-07 ENCOUNTER — Encounter: Payer: Self-pay | Admitting: Orthopedic Surgery

## 2023-12-07 ENCOUNTER — Ambulatory Visit (INDEPENDENT_AMBULATORY_CARE_PROVIDER_SITE_OTHER): Admitting: Orthopedic Surgery

## 2023-12-07 DIAGNOSIS — R202 Paresthesia of skin: Secondary | ICD-10-CM

## 2023-12-07 NOTE — Progress Notes (Signed)
 New Patient Visit  Assessment: Thomas Hopkins is a 38 y.o. male with the following: 1. Paresthesia of skin   Plan: Thomas Hopkins has pain, numbness and tingling in bilateral hands.  Left is worse than right.  Description of his symptoms, as well as physical exam consistent with potential carpal tunnel syndrome.  We will obtain EMGs to further evaluate.  Once the results are available, we will meet in clinic to discuss the findings.  Follow-up: Return for After EMG.  Subjective:  Chief Complaint  Patient presents with   Bilateral hand pain    Numbness and tingling    History of Present Illness: Thomas Hopkins is a 38 y.o. male who has been referred by Thomas Petty, NP for evaluation of bilateral hand pain.  He is left-hand dominant.  He notes numbness and tingling in both hands, with the left being worse than the right.  This has been ongoing for several years.  It is progressively worsening.  He has tried braces, which provide some improvement in his symptoms.  Vibration from yard equipment makes his symptoms worse.  He wakes up shaking his hands.  Occasionally, he notes some numbness and tingling into the forearm.  Medications have not been helpful.   Review of Systems: No fevers or chills + numbness or tingling No chest pain No shortness of breath No bowel or bladder dysfunction No GI distress No headaches   Medical History:  Past Medical History:  Diagnosis Date   DVT of lower extremity, bilateral (HCC)    stopped Eliquis  in 2017   GERD (gastroesophageal reflux disease)    Hyperlipidemia     Past Surgical History:  Procedure Laterality Date   ABDOMINAL AORTOGRAM W/LOWER EXTREMITY Right 11/18/2016   Procedure: ABDOMINAL AORTOGRAM W/LOWER EXTREMITY;  Surgeon: Jama Cordella MATSU, MD;  Location: [facility] INVASIVE CV LAB;  Service: Cardiovascular;  Laterality: Right;   APPENDECTOMY     LAPAROSCOPIC APPENDECTOMY N/A 10/14/2014   Procedure:  APPENDECTOMY LAPAROSCOPIC;  Surgeon: Charlie FORBES Fell, MD;  Location: [facility] ORS;  Service: General;  Laterality: N/A;   TONSILLECTOMY     WISDOM TOOTH EXTRACTION      Family History  Problem Relation Age of Onset   Diabetes Mother    Rectal cancer Father    Scoliosis Brother    Uterine cancer Maternal Aunt    Diabetes Mellitus II Maternal Grandmother    Hypertension Maternal Grandmother    Heart disease Maternal Grandfather    Social History   Tobacco Use   Smoking status: Every Day    Current packs/day: 0.25    Average packs/day: 0.3 packs/day for 15.0 years (3.8 ttl pk-yrs)    Types: Cigarettes   Smokeless tobacco: Never  Vaping Use   Vaping status: Never Used  Substance Use Topics   Alcohol use: Yes    Comment: very occasionally   Drug use: No    Allergies  Allergen Reactions   Bee Venom Hives   Morphine  Nausea Only   Vicodin [Hydrocodone -Acetaminophen ] Nausea And Vomiting    Headache   Hydrocodone -Acetaminophen  Nausea And Vomiting    Headache   Mushroom Extract Complex (Obsolete) Nausea And Vomiting   Olive Oil Nausea And Vomiting    Current Meds  Medication Sig   apixaban  (ELIQUIS ) 5 MG TABS tablet Take 1 tablet (5 mg total) by mouth 2 (two) times daily.   aspirin  EC 81 MG tablet Take 81 mg by mouth daily. Swallow whole.   atorvastatin  (LIPITOR) 40 MG tablet  TAKE 1 TABLET BY MOUTH EVERY DAY AFTER 6PM   gabapentin  (NEURONTIN ) 100 MG capsule Take 1 capsule (100 mg total) by mouth at bedtime. (Patient taking differently: Take 100 mg by mouth as needed.)    Objective: There were no vitals taken for this visit.  Physical Exam:  General: Alert and oriented. and No acute distress. Gait: Normal gait.  Evaluation of both hands demonstrates no deformity.  No redness.  Normal muscle bulk.  He is able to make a full fist.  Good grip strength.  Positive Phalen's bilaterally, left worse than right.  Negative Tinel's.  Positive carpal tunnel compression.  Fingers warm  well-perfused.  IMAGING: I personally ordered and reviewed the following images  X-rays of bilateral wrist were obtained today.  No acute injuries.  No dislocation.  No bony lesions   New Medications:  No orders of the defined types were placed in this encounter.     Oneil DELENA Horde, MD  12/07/2023 10:25 AM

## 2023-12-07 NOTE — Addendum Note (Signed)
 Addended by: VANDERBILT LIONEL CROME on: 12/07/2023 10:28 AM   Modules accepted: Orders

## 2023-12-18 ENCOUNTER — Ambulatory Visit

## 2023-12-26 ENCOUNTER — Ambulatory Visit

## 2023-12-26 ENCOUNTER — Encounter
Admission: RE | Admit: 2023-12-26 | Discharge: 2023-12-26 | Disposition: A | Discharge status: Home / Self Care | Source: Ambulatory Visit | Attending: Cardiology | Admitting: Cardiology

## 2023-12-26 ENCOUNTER — Other Ambulatory Visit: Payer: Self-pay | Admitting: Physician Assistant

## 2023-12-26 ENCOUNTER — Ambulatory Visit
Admission: RE | Admit: 2023-12-26 | Discharge: 2023-12-26 | Disposition: A | Discharge status: Home / Self Care | Source: Ambulatory Visit | Attending: Cardiology | Admitting: Cardiology

## 2023-12-26 DIAGNOSIS — R0602 Shortness of breath: Secondary | ICD-10-CM | POA: Insufficient documentation

## 2023-12-26 LAB — NM MYOCAR MULTI W/SPECT W/WALL MOTION / EF
LV dias vol: 183 mL (ref 62–150)
LV sys vol: 79 mL (ref 4.2–5.8)
MPHR: 182 {beats}/min
Nuc Stress EF: 57 %
Peak HR: 99 {beats}/min
Percent HR: 54 %
Rest HR: 47 {beats}/min
Rest Nuclear Isotope Dose: 10.3 mCi
SDS: 0
SRS: 11
SSS: 3
ST Depression (mm): 0 mm
Stress Nuclear Isotope Dose: 32.5 mCi
TID: 1.12

## 2023-12-26 MED ORDER — TECHNETIUM TC 99M TETROFOSMIN IV KIT
10.3400 | PACK | Freq: Once | INTRAVENOUS | Status: AC | PRN
  Administered 2023-12-26: 10.34 via INTRAVENOUS

## 2023-12-26 MED ORDER — REGADENOSON 0.4 MG/5ML IV SOLN
0.4000 mg | Freq: Once | INTRAVENOUS | Status: AC
  Administered 2023-12-26: 0.4 mg via INTRAVENOUS

## 2023-12-26 MED ORDER — TECHNETIUM TC 99M TETROFOSMIN IV KIT
32.4900 | PACK | Freq: Once | INTRAVENOUS | Status: AC | PRN
Start: 2023-12-26 — End: 2023-12-26
  Administered 2023-12-26: 32.49 via INTRAVENOUS

## 2023-12-26 NOTE — Progress Notes (Signed)
     Dorn MAFFUCCI Heinecke presented for a nuclear stress test today.  I Lesley LITTIE Maffucci, PA-C, provided direct supervision and was present during the stress portion of the study today, which was completed without significant symptoms, immediate complications, or acute ST/T changes on ECG.  Stress imaging is pending at this time.  Preliminary ECG findings may be listed in the chart, but the stress test result will not be finalized until perfusion imaging is complete.  Lesley LITTIE Maffucci, PA-C  12/26/2023, 10:51 AM

## 2023-12-27 ENCOUNTER — Encounter

## 2023-12-28 ENCOUNTER — Ambulatory Visit: Payer: Self-pay | Admitting: Cardiology

## 2023-12-28 DIAGNOSIS — I514 Myocarditis, unspecified: Secondary | ICD-10-CM

## 2023-12-28 NOTE — Progress Notes (Signed)
 With complaints of ongoing chest discomfort and a stress test revealing possible scar recommend cardiac MRI for further evaluation for possible myocarditis and further evaluation to determine if there is scar.

## 2024-01-04 ENCOUNTER — Ambulatory Visit

## 2024-01-15 ENCOUNTER — Encounter: Payer: Self-pay | Admitting: Radiology

## 2024-01-15 ENCOUNTER — Ambulatory Visit: Admission: RE | Admit: 2024-01-15 | Source: Ambulatory Visit

## 2024-01-18 ENCOUNTER — Ambulatory Visit: Admitting: Nurse Practitioner

## 2024-01-29 ENCOUNTER — Ambulatory Visit: Admitting: Cardiology

## 2024-02-05 ENCOUNTER — Encounter (HOSPITAL_COMMUNITY): Payer: Self-pay

## 2024-02-05 ENCOUNTER — Other Ambulatory Visit

## 2024-02-06 ENCOUNTER — Other Ambulatory Visit (INDEPENDENT_AMBULATORY_CARE_PROVIDER_SITE_OTHER): Payer: Self-pay | Admitting: Vascular Surgery

## 2024-02-06 DIAGNOSIS — M79662 Pain in left lower leg: Secondary | ICD-10-CM

## 2024-02-07 ENCOUNTER — Ambulatory Visit: Admission: RE | Admit: 2024-02-07 | Source: Ambulatory Visit

## 2024-02-12 ENCOUNTER — Encounter (INDEPENDENT_AMBULATORY_CARE_PROVIDER_SITE_OTHER): Payer: Self-pay | Admitting: Vascular Surgery

## 2024-02-12 ENCOUNTER — Ambulatory Visit (INDEPENDENT_AMBULATORY_CARE_PROVIDER_SITE_OTHER): Payer: 59

## 2024-02-12 ENCOUNTER — Ambulatory Visit (INDEPENDENT_AMBULATORY_CARE_PROVIDER_SITE_OTHER): Payer: 59 | Admitting: Vascular Surgery

## 2024-02-12 ENCOUNTER — Ambulatory Visit: Admitting: Nurse Practitioner

## 2024-02-12 VITALS — BP 119/72 | HR 66 | Resp 17 | Ht 69.0 in | Wt 287.0 lb

## 2024-02-12 DIAGNOSIS — I743 Embolism and thrombosis of arteries of the lower extremities: Secondary | ICD-10-CM | POA: Diagnosis not present

## 2024-02-12 DIAGNOSIS — M79662 Pain in left lower leg: Secondary | ICD-10-CM

## 2024-02-12 DIAGNOSIS — M7989 Other specified soft tissue disorders: Secondary | ICD-10-CM

## 2024-02-12 DIAGNOSIS — I890 Lymphedema, not elsewhere classified: Secondary | ICD-10-CM

## 2024-02-12 DIAGNOSIS — Z86718 Personal history of other venous thrombosis and embolism: Secondary | ICD-10-CM

## 2024-02-12 NOTE — Progress Notes (Deleted)
   There were no vitals taken for this visit.   Subjective:    Patient ID: VIJAY DURFLINGER, male    DOB: [birth year], 38 y.o.   MRN: [id number]  HPI: DAMONE FANCHER is a 38 y.o. male  No chief complaint on file.   DVT Recently saw vascular.  DVT is improving with Eliquis .  Will remain on eliquis  indefinitely.  This is not his first clot.  He has followed up with Hematology and was told he will need to be on blood thinners for the rest of his life.   HYPERLIPIDEMIA Hyperlipidemia status: excellent compliance Satisfied with current treatment?  yes Side effects:  no Medication compliance: excellent compliance Past cholesterol meds: atorvastain (lipitor) Supplements: none Aspirin :  yes The ASCVD Risk score (Arnett DK, et al., 2019) failed to calculate for the following reasons: The 2019 ASCVD risk score is only valid for ages 33 to 70 Chest pain:  no Coronary artery disease:  no Family history CAD:  no Family history early CAD:  no  SMOKING CESSATION Smoking Status: current everyday smoker Smoking Amount: 1 pack every 2-3 days Smoking Onset: teenager Smoking Quit Date:  Smoking triggers: work and stress Type of tobacco use:  cigarettes Children in the house: no Other household members who smoke: no Treatments attempted:  Pneumovax:    Relevant past medical, surgical, family and social history reviewed and updated as indicated. Interim medical history since our last visit reviewed. Allergies and medications reviewed and updated.  Review of Systems  Per HPI unless specifically indicated above     Objective:    There were no vitals taken for this visit.  Wt Readings from Last 3 Encounters:  11/24/23 296 lb 9.6 oz (134.5 kg)  11/07/23 (!) 308 lb 10.3 oz (140 kg)  10/18/23 (!) 301 lb 6.4 oz (136.7 kg)    Physical Exam  Results for orders placed or performed during the hospital encounter of 12/26/23  NM Myocar Multi W/Spect W/Wall Motion / EF    Collection Time: 12/26/23 11:37 AM  Result Value Ref Range   Rest HR 47.0 bpm   Rest BP 105/54 mmHg   Peak HR 99 bpm   Peak BP 121/55 mmHg   MPHR 182 bpm   Percent HR 54.0 %   ST Depression (mm) 0 mm   Rest Nuclear Isotope Dose 10.3 mCi   Stress Nuclear Isotope Dose 32.5 mCi   SSS 3.0    SRS 11.0    SDS 0.0    TID 1.12    LV sys vol 79.0 4.2 - 5.8 mL   LV dias vol 183.0 62 - 150 mL   Nuc Stress EF 57 %      Assessment & Plan:   Problem List Items Addressed This Visit   None    Follow up plan: No follow-ups on file.

## 2024-02-12 NOTE — Progress Notes (Signed)
 MRN : [id number]  Thomas Hopkins is a 38 y.o. (10-Oct-1985) male who presents with chief complaint of legs hurt and swell.  History of Present Illness:   The patient returns to the office for followup evaluation regarding left leg pain and swelling.  The swelling has persisted but does not appear to be worsening which is an improvement compared to last visit.  The pain associated with swelling continues. There have not been any interval development of a ulcerations or wounds but he has developed a rash that is concerning to him.   Since the previous visit the patient has been wearing graduated compression stockings and has noted little if any improvement in the lymphedema. The patient has been using compression routinely morning until night.   The patient also states elevation during the day and exercise is being done too.  Interval development of headaches and chest pains which is currently being worked up.  He is scheduled to have an echocardiogram.  No outpatient medications have been marked as taking for the 02/12/24 encounter (Appointment) with Jama, Cordella MATSU, MD.    Past Medical History:  Diagnosis Date   DVT of lower extremity, bilateral (HCC)    stopped Eliquis  in 2017   GERD (gastroesophageal reflux disease)    Hyperlipidemia     Past Surgical History:  Procedure Laterality Date   ABDOMINAL AORTOGRAM W/LOWER EXTREMITY Right 11/18/2016   Procedure: ABDOMINAL AORTOGRAM W/LOWER EXTREMITY;  Surgeon: Jama Cordella MATSU, MD;  Location: [facility];  Service: Cardiovascular;  Laterality: Right;   APPENDECTOMY     LAPAROSCOPIC APPENDECTOMY N/A 10/14/2014   Procedure: APPENDECTOMY LAPAROSCOPIC;  Surgeon: Charlie FORBES Fell, MD;  Location: [facility] OR[facility];  Service: General;  Laterality: N/A;   TONSILLECTOMY     WISDOM TOOTH EXTRACTION      Social History Social History   Tobacco Use   Smoking status: Every Day    Current packs/day: 0.25    Average  packs/day: 0.3 packs/day for 15.0 years (3.8 ttl pk-yrs)    Types: Cigarettes   Smokeless tobacco: Never  Vaping Use   Vaping status: Never Used  Substance Use Topics   Alcohol use: Yes    Comment: very occasionally   Drug use: No    Family History Family History  Problem Relation Age of Onset   Diabetes Mother    Rectal cancer Father    Scoliosis Brother    Uterine cancer Maternal Aunt    Diabetes Mellitus II Maternal Grandmother    Hypertension Maternal Grandmother    Heart disease Maternal Grandfather     Allergies  Allergen Reactions   Bee Venom Hives   Morphine  Nausea Only   Vicodin [Hydrocodone -Acetaminophen ] Nausea And Vomiting    Headache   Hydrocodone -Acetaminophen  Nausea And Vomiting    Headache   Mushroom Extract Complex (Obsolete) Nausea And Vomiting   Olive Oil Nausea And Vomiting     REVIEW OF SYSTEMS (Negative unless checked)  Constitutional: [] Weight loss  [] Fever  [] Chills Cardiac: [] Chest pain   [] Chest pressure   [] Palpitations   [] Shortness of breath when laying flat   [] Shortness of breath with exertion. Vascular:  [] Pain in legs with walking   [x] Pain in legs at rest  [] History of DVT   [] Phlebitis   [x] Swelling in legs   [] Varicose veins   [] Non-healing ulcers Pulmonary:   [] Uses home oxygen   [] Productive cough   [] Hemoptysis   [] Wheeze  [] COPD   [] Asthma  Neurologic:  [] Dizziness   [] Seizures   [] History of stroke   [] History of TIA  [] Aphasia   [] Vissual changes   [] Weakness or numbness in arm   [] Weakness or numbness in leg Musculoskeletal:   [] Joint swelling   [] Joint pain   [] Low back pain Hematologic:  [] Easy bruising  [] Easy bleeding   [] Hypercoagulable state   [] Anemic Gastrointestinal:  [] Diarrhea   [] Vomiting  [] Gastroesophageal reflux/heartburn   [] Difficulty swallowing. Genitourinary:  [] Chronic kidney disease   [] Difficult urination  [] Frequent urination   [] Blood in urine Skin:  [] Rashes   [] Ulcers  Psychological:  [] History of  anxiety   []  History of major depression.  Physical Examination  There were no vitals filed for this visit. There is no height or weight on file to calculate BMI. Gen: WD/WN, NAD Head: [place]/AT, No temporalis wasting.  Ear/Nose/Throat: Hearing grossly intact, nares w/o erythema or drainage, pinna without lesions Eyes: PER, EOMI, sclera nonicteric.  Neck: Supple, no gross masses.  No JVD.  Pulmonary:  Good air movement, no audible wheezing, no use of accessory muscles.  Cardiac: RRR, precordium not hyperdynamic. Vascular:  scattered varicosities present bilaterally.  Moderate venous stasis changes to the legs bilaterally.  2+ soft pitting edema. CEAP C4sEpAsPr   Vessel Right Left  Radial Palpable Palpable  Gastrointestinal: soft, non-distended. No guarding/no peritoneal signs.  Musculoskeletal: M/S 5/5 throughout.  No deformity.  Neurologic: CN 2-12 intact. Pain and light touch intact in extremities.  Symmetrical.  Speech is fluent. Motor exam as listed above. Psychiatric: Judgment intact, Mood & affect appropriate for pt's clinical situation. Dermatologic: Venous rashes no ulcers noted.  No changes consistent with cellulitis. Lymph : No lichenification or skin changes of chronic lymphedema.  CBC Lab Results  Component Value Date   WBC 8.1 11/07/2023   HGB 15.1 11/07/2023   HCT 45.5 11/07/2023   MCV 89.4 11/07/2023   PLT 274 11/07/2023    BMET    Component Value Date/Time   NA 138 11/07/2023 1139   NA 137 08/10/2023 1113   K 3.8 11/07/2023 1139   CL 106 11/07/2023 1139   CO2 24 11/07/2023 1139   GLUCOSE 128 (H) 11/07/2023 1139   BUN 9 11/07/2023 1139   BUN 10 08/10/2023 1113   CREATININE 0.78 11/07/2023 1139   CALCIUM  9.0 11/07/2023 1139   GFRNONAA >60 11/07/2023 1139   GFRAA >60 11/28/2017 0834   CrCl cannot be calculated (Patient's most recent lab result is older than the maximum 21 days allowed.).  COAG Lab Results  Component Value Date   INR 1.0 11/07/2023   INR  1.05 11/18/2016   INR 1.23 10/23/2014    Radiology No results found.   Assessment/Plan 1. Pain and swelling of left lower leg (Primary) Recommend:   No surgery or intervention at this point in time.   The Patient is CEAP C4sEpAsPr.   The patient has been wearing compression for more than 12 weeks with no or little benefit.  The patient has been exercising daily for more than 12 weeks. The patient has been elevating and taking OTC pain medications for more than 12 weeks.  None of these have have eliminated the pain related to the lymphedema or the discomfort regarding excessive swelling and venous congestion.     I have reviewed my discussion with the patient regarding lymphedema and why it  causes symptoms.  Patient will continue wearing graduated compression on a daily basis. The patient should put the compression on first thing in  the morning and removing them in the evening. The patient should not sleep in the compression.    In addition, behavioral modification throughout the day will be continued.  This will include frequent elevation (such as in a recliner), use of over the counter pain medications as needed and exercise such as walking.   The systemic causes for chronic edema such as liver, kidney and cardiac etiologies do not appear to have significant changed over the past year.     The patient has chronic , severe lymphedema with hyperpigmentation of the skin and has done MLD, skin care, medication, diet, exercise, elevation and compression for 4 weeks with no improvement,  I am recommending a lymphedema pump.  The patient still has stage 3 lymphedema and therefore, I believe that a lymph pump is needed to improve the control of the patient's lymphedema and improve the quality of life.  Additionally, a lymph pump is warranted because it will reduce the risk of cellulitis and ulceration in the future.   Patient should follow-up in 12 months  - VAS US  LOWER EXTREMITY VENOUS (DVT);  Future  2. History of DVT (deep vein thrombosis) Recommend:   No surgery or intervention at this point in time.   The Patient is CEAP C4sEpAsPr.   The patient has been wearing compression for more than 12 weeks with no or little benefit.  The patient has been exercising daily for more than 12 weeks. The patient has been elevating and taking OTC pain medications for more than 12 weeks.  None of these have have eliminated the pain related to the lymphedema or the discomfort regarding excessive swelling and venous congestion.     I have reviewed my discussion with the patient regarding lymphedema and why it  causes symptoms.  Patient will continue wearing graduated compression on a daily basis. The patient should put the compression on first thing in the morning and removing them in the evening. The patient should not sleep in the compression.    In addition, behavioral modification throughout the day will be continued.  This will include frequent elevation (such as in a recliner), use of over the counter pain medications as needed and exercise such as walking.   The systemic causes for chronic edema such as liver, kidney and cardiac etiologies do not appear to have significant changed over the past year.     The patient has chronic , severe lymphedema with hyperpigmentation of the skin and has done MLD, skin care, medication, diet, exercise, elevation and compression for 4 weeks with no improvement,  I am recommending a lymphedema pump.  The patient still has stage 3 lymphedema and therefore, I believe that a lymph pump is needed to improve the control of the patient's lymphedema and improve the quality of life.  Additionally, a lymph pump is warranted because it will reduce the risk of cellulitis and ulceration in the future.   Patient should follow-up in 12 months  - VAS US  LOWER EXTREMITY VENOUS (DVT); Future  3. Lymphedema Recommend:   No surgery or intervention at this point in time.    The Patient is CEAP C4sEpAsPr.   The patient has been wearing compression for more than 12 weeks with no or little benefit.  The patient has been exercising daily for more than 12 weeks. The patient has been elevating and taking OTC pain medications for more than 12 weeks.  None of these have have eliminated the pain related to the lymphedema or the discomfort  regarding excessive swelling and venous congestion.     I have reviewed my discussion with the patient regarding lymphedema and why it  causes symptoms.  Patient will continue wearing graduated compression on a daily basis. The patient should put the compression on first thing in the morning and removing them in the evening. The patient should not sleep in the compression.    In addition, behavioral modification throughout the day will be continued.  This will include frequent elevation (such as in a recliner), use of over the counter pain medications as needed and exercise such as walking.   The systemic causes for chronic edema such as liver, kidney and cardiac etiologies do not appear to have significant changed over the past year.     The patient has chronic , severe lymphedema with hyperpigmentation of the skin and has done MLD, skin care, medication, diet, exercise, elevation and compression for 4 weeks with no improvement,  I am recommending a lymphedema pump.  The patient still has stage 3 lymphedema and therefore, I believe that a lymph pump is needed to improve the control of the patient's lymphedema and improve the quality of life.  Additionally, a lymph pump is warranted because it will reduce the risk of cellulitis and ulceration in the future.   Patient should follow-up in 12 months   4. 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  Continue Eliquis  twice a day with a 81 mg of aspirin   daily    Cordella Shawl, MD  02/12/2024 9:47 AM

## 2024-02-15 ENCOUNTER — Ambulatory Visit: Attending: Cardiology

## 2024-02-15 DIAGNOSIS — R0602 Shortness of breath: Secondary | ICD-10-CM | POA: Insufficient documentation

## 2024-02-15 LAB — ECHOCARDIOGRAM COMPLETE
AR max vel: 2.95 cm2
AV Area VTI: 2.96 cm2
AV Area mean vel: 2.61 cm2
AV Mean grad: 5 mmHg
AV Peak grad: 8.8 mmHg
Ao pk vel: 1.48 m/s
Area-P 1/2: 3.27 cm2
S' Lateral: 3.13 cm

## 2024-02-16 ENCOUNTER — Encounter: Payer: Self-pay | Admitting: Cardiology

## 2024-02-16 ENCOUNTER — Ambulatory Visit: Attending: Cardiology | Admitting: Cardiology

## 2024-02-16 VITALS — BP 104/69 | HR 59 | Ht 69.0 in | Wt 296.8 lb

## 2024-02-16 DIAGNOSIS — Z79899 Other long term (current) drug therapy: Secondary | ICD-10-CM | POA: Diagnosis not present

## 2024-02-16 DIAGNOSIS — I890 Lymphedema, not elsewhere classified: Secondary | ICD-10-CM

## 2024-02-16 DIAGNOSIS — R001 Bradycardia, unspecified: Secondary | ICD-10-CM

## 2024-02-16 DIAGNOSIS — R079 Chest pain, unspecified: Secondary | ICD-10-CM | POA: Diagnosis not present

## 2024-02-16 DIAGNOSIS — R600 Localized edema: Secondary | ICD-10-CM

## 2024-02-16 DIAGNOSIS — R0602 Shortness of breath: Secondary | ICD-10-CM

## 2024-02-16 DIAGNOSIS — I82503 Chronic embolism and thrombosis of unspecified deep veins of lower extremity, bilateral: Secondary | ICD-10-CM

## 2024-02-16 DIAGNOSIS — E782 Mixed hyperlipidemia: Secondary | ICD-10-CM

## 2024-02-16 MED ORDER — LORATADINE 10 MG PO TABS: 10.0000 mg | ORAL_TABLET | Freq: Every day | ORAL | 0 refills | Status: AC

## 2024-02-16 NOTE — Patient Instructions (Signed)
 Medication Instructions:  Your physician recommends that you continue on your current medications as directed. Please refer to the Current Medication list given to you today.   *If you need a refill on your cardiac medications before your next appointment, please call your pharmacy*  Lab Work: CBC If you have labs (blood work) drawn today and your tests are completely normal, you will receive your results only by: MyChart Message (if you have MyChart) OR A paper copy in the mail If you have any lab test that is abnormal or we need to change your treatment, we will call you to review the results.  Testing/Procedures: No test ordered today   Follow-Up: At [facility], you and your health needs are our priority.  As part of our continuing mission to provide you with exceptional heart care, our providers are all part of one team.  This team includes your primary Cardiologist (physician) and Advanced Practice Providers or APPs (Physician Assistants and Nurse Practitioners) who all work together to provide you with the care you need, when you need it.  Your next appointment:   6 week(s)  Provider:   Tylene Lunch, NP

## 2024-02-16 NOTE — Progress Notes (Signed)
 Discussed result findings at appointment.

## 2024-02-16 NOTE — Progress Notes (Signed)
 Cardiology Office Note   Date:  02/16/2024  ID:  TYJUAN DEMETRO, DOB 1985-07-22, MRN [id number] PCP: Melvin Pao, NP  [facility] Providers Cardiologist:  None Cardiology APP:  Gerard Frederick, NP     History of Present Illness Thomas Hopkins is a 38 y.o. male with a past medical history of hyperlipidemia, peripheral artery disease, history of recurrent bilateral DVT, lymphedema, GERD, who presents today for follow-up.   Patient previously been followed by vascular regarding left leg pain and swelling.  He has been treated for lymphedema where he has been wearing graduated compression stockings and noted little improvement.  He continues to have pain and swelling left lower extremity venous duplex was ordered to rule out DVT.  He was then evaluated in the [facility] emergency department on 11/07/2023 with complaint of chest pain. Reportedly been dealing with it for about a month and intermittent pressure in the center of his chest. 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 He denies significant chest pain or shortness of breath on evaluation in the emergency department stated had been dealing with it and it was worsening. He also had a headache over the last month. Reported dry cough over the past week but denied any fevers. He has chronic swelling in his legs that is unchanged. History of DVT to the left leg currently taking Eliquis  but admits to missing some doses. Blood pressure was 129/76, pulse 54, respirations of 18, temperature 98.6. EKG showed no evidence of arrhythmia or ischemia troponin was within normal limits. CT of the chest to rule out PE was ordered also CT of the head with increasing frequency and severity and headaches. CT of the head was negative for acute process. CT of the chest also unremarkable with no evidence of PE. 2 sets of troponin within normal limits and  the patient had no active chest pain at that time. He was considered stable for discharge and a referral to cardiology was made.   He was last seen in clinic 11/24/2023 with continued complaints of chest discomfort that he states has been intermittent in the center of his chest that is worse with exertion.  He was scheduled for Lexiscan  Myoview  for his ongoing chest discomfort and an echocardiogram to rule out functional and structural abnormalities.    He returns to clinic today stating that he has been sick for about the past 2 weeks.  He has had increased amount of cough nasal congestion and chills.  He also had muscle aches and bodyaches.  States that with his cough he is continuing to have some atypical chest discomfort but denies any other associated symptoms.  He recently followed up with vascular about concerns of discoloration to his left lower extremity and was advised it was normal progression due to chronic DVT.  States that he has been compliant with his current medication regimen.  Denies any issues with bleeding with no blood noted in his urine or stool.  He did have 1 nosebleed since he started with the cough and upper respiratory symptoms.  Denies any recent hospitalizations or visits to the emergency department.  ROS: 10 point review of systems has been reviewed and considered negative except was been listed in the HPI  Studies Reviewed     2d echo 02/15/2024 1. Left ventricular ejection fraction, by estimation, is 55 to 60%. The  left ventricle has normal function. Left ventricular endocardial  border  not optimally defined to evaluate regional wall motion. There is mild left  ventricular hypertrophy. Left  ventricular diastolic parameters were normal. The average left ventricular  global longitudinal strain is -19.1 %. The global longitudinal strain is  normal.   2. Right ventricular systolic function is normal. The right ventricular  size is normal. Tricuspid regurgitation signal  is inadequate for assessing  PA pressure.   3. The mitral valve is normal in structure. No evidence of mitral valve  regurgitation. No evidence of mitral stenosis.   4. The aortic valve is tricuspid. Aortic valve regurgitation is not  visualized. No aortic stenosis is present.   5. The inferior vena cava is normal in size with greater than 50%  respiratory variability, suggesting right atrial pressure of 3 mmHg.   Lexiscan  MPI 12/26/2023   Abnormal, probably low risk pharmacologic myocardial perfusion stress test.   There is a small in size, moderate in severity, fixed apical inferior and apical lateral defect most consistent with scar.   Left ventricular systolic function is normal (LVEF 55-65%).   No significant coronary artery calcification is identified.   Sensitivity and specificity of the study are limited due to body habitus and diaphragmatic attenuation.   Risk Assessment/Calculations           Physical Exam VS:  BP 104/69   Pulse (!) 59   Ht 5' 9 (1.753 m)   Wt 296 lb 12.8 oz (134.6 kg)   SpO2 96%   BMI 43.83 kg/m        Wt Readings from Last 3 Encounters:  02/16/24 296 lb 12.8 oz (134.6 kg)  02/12/24 287 lb (130.2 kg)  11/24/23 296 lb 9.6 oz (134.5 kg)    GEN: Well nourished, well developed in no acute distress NECK: No JVD; No carotid bruits CARDIAC: RRR, no murmurs, rubs, gallops RESPIRATORY:  Clear to auscultation without rales, wheezing or rhonchi  ABDOMEN: Soft, non-tender, non-distended EXTREMITIES:  No edema; No deformity   ASSESSMENT AND PLAN Chest pain that has been atypical and typical.  He underwent a Lexiscan  Myoview  that was considered abnormal probably low risk pharmacologic myocardial perfusion stress testing there was a small in size and moderate in severity fixed apical inferior and apical lateral defects most consistent with scar.  Sensitivity and specificity of the study were limited due to body habitus and diaphragmatic attenuation.  He was  then scheduled for cardiac MRI to evaluate scar.  Echocardiogram revealed an LVEF of 55 to 60%.   Shortness of breath that has been chronic and ongoing.  Echocardiogram revealed LVEF of 55 to 60%, with mild left ventricular hypertrophy, left ventricular diastolic parameters were normal, right ventricular function and size were normal and there were no valvular abnormalities noted.  Hyperlipidemia with an LDL of 120.  He is continued on 40 mg of atorvastatin .  Ongoing management with his PCP.  Peripheral edema with lymphedema with recent ultrasound completed showing a chronic DVT.  He has been continued on apixaban  5 mg twice daily with improved adherence.  Ongoing management per VVS  Sinus bradycardia noted on EKG rate continues to remain asymptomatic.  Will continue to monitor with surveillance studies.  Cough and congestion were patient states he has been taking DayQuil and NyQuil with improvement.  He has been prescribed loratadine  10 mg daily.       Dispo: Patient to return to clinic to see MD/APP in 6 weeks or sooner if needed  Signed, Augusta Mirkin, NP

## 2024-02-19 ENCOUNTER — Ambulatory Visit

## 2024-02-19 ENCOUNTER — Encounter: Payer: Self-pay | Admitting: Nurse Practitioner

## 2024-02-19 ENCOUNTER — Ambulatory Visit: Admitting: Nurse Practitioner

## 2024-02-19 VITALS — BP 115/72 | HR 49 | Temp 98.2°F | Ht 69.02 in | Wt 291.0 lb

## 2024-02-19 DIAGNOSIS — R051 Acute cough: Secondary | ICD-10-CM

## 2024-02-19 DIAGNOSIS — R7303 Prediabetes: Secondary | ICD-10-CM

## 2024-02-19 DIAGNOSIS — Z0000 Encounter for general adult medical examination without abnormal findings: Secondary | ICD-10-CM

## 2024-02-19 DIAGNOSIS — Z7901 Long term (current) use of anticoagulants: Secondary | ICD-10-CM | POA: Diagnosis not present

## 2024-02-19 MED ORDER — AMOXICILLIN 500 MG PO CAPS: 500.0000 mg | ORAL_CAPSULE | Freq: Two times a day (BID) | ORAL | 0 refills | Status: AC

## 2024-02-19 NOTE — Progress Notes (Deleted)
   There were no vitals taken for this visit.   Subjective:    Patient ID: Thomas Hopkins, male    DOB: [birth year], 38 y.o.   MRN: [id number]  HPI: Thomas Hopkins is a 38 y.o. male  No chief complaint on file.   DVT Recently saw vascular.  DVT is improving with Eliquis .  Will remain on eliquis  indefinitely.  This is not his first clot.  He has followed up with Hematology and was told he will need to be on blood thinners for the rest of his life.   HYPERLIPIDEMIA Hyperlipidemia status: excellent compliance Satisfied with current treatment?  yes Side effects:  no Medication compliance: excellent compliance Past cholesterol meds: atorvastain (lipitor) Supplements: none Aspirin :  yes The ASCVD Risk score (Arnett DK, et al., 2019) failed to calculate for the following reasons: The 2019 ASCVD risk score is only valid for ages 70 to 68 Chest pain:  no Coronary artery disease:  no Family history CAD:  no Family history early CAD:  no  SMOKING CESSATION Smoking Status: current everyday smoker Smoking Amount: 1 pack every 2-3 days Smoking Onset: teenager Smoking Quit Date:  Smoking triggers: work and stress Type of tobacco use:  cigarettes Children in the house: no Other household members who smoke: no Treatments attempted:  Pneumovax:    Relevant past medical, surgical, family and social history reviewed and updated as indicated. Interim medical history since our last visit reviewed. Allergies and medications reviewed and updated.  Review of Systems  Per HPI unless specifically indicated above     Objective:    There were no vitals taken for this visit.  Wt Readings from Last 3 Encounters:  02/16/24 296 lb 12.8 oz (134.6 kg)  02/12/24 287 lb (130.2 kg)  11/24/23 296 lb 9.6 oz (134.5 kg)    Physical Exam  Results for orders placed or performed in visit on 01/04/24  ECHOCARDIOGRAM COMPLETE   Collection Time: 02/15/24  9:34 AM  Result Value Ref Range    AR max vel 2.95 cm2   AV Peak grad 8.8 mmHg   Ao pk vel 1.48 m/s   S' Lateral 3.13 cm   Area-P 1/2 3.27 cm2   AV Area VTI 2.96 cm2   AV Mean grad 5.0 mmHg   AV Area mean vel 2.61 cm2   Est EF 55 - 60%       Assessment & Plan:   Problem List Items Addressed This Visit   None    Follow up plan: No follow-ups on file.

## 2024-02-19 NOTE — Assessment & Plan Note (Signed)
 Recommended eating smaller high protein, low fat meals more frequently and exercising 30 mins a day 5 times a week with a goal of 10-15lb weight loss in the next 3 months.

## 2024-02-19 NOTE — Progress Notes (Signed)
 BP 115/72 (BP Location: Left Arm, Patient Position: Sitting, Cuff Size: Large)   Pulse (!) 49   Temp 98.2 F (36.8 C) (Oral)   Ht 5' 9.02 (1.753 m)   Wt 291 lb (132 kg)   SpO2 95%   BMI 42.95 kg/m    Subjective:    Patient ID: Thomas Hopkins, male    DOB: [birth year], 38 y.o.   MRN: [id number]  HPI: Thomas Hopkins is a 38 y.o. male presenting on 02/19/2024 for comprehensive medical examination. Current medical complaints include:none  He currently lives with: Interim Problems from his last visit: no  DVT Recently saw vascular.  DVT is improving with Eliquis .  Will remain on eliquis  indefinitely.  This is not his first clot.  He has followed up with Hematology and was told he will need to be on blood thinners for the rest of his life.  Patient has seen Cardiology for follow up also.   HYPERLIPIDEMIA Hyperlipidemia status: excellent compliance Satisfied with current treatment?  yes Side effects:  no Medication compliance: excellent compliance Past cholesterol meds: atorvastain (lipitor) Supplements: none Aspirin :  yes The ASCVD Risk score (Arnett DK, et al., 2019) failed to calculate for the following reasons: The 2019 ASCVD risk score is only valid for ages 42 to 60 Chest pain:  no Coronary artery disease:  no Family history CAD:  no Family history early CAD:  no  UPPER RESPIRATORY TRACT INFECTION Worst symptom: symptoms started about 3 weeks ago Fever: no Cough: yes Shortness of breath: yes Wheezing: yes Chest pain: no Chest tightness: no Chest congestion: yes Nasal congestion: yes Runny nose: yes Post nasal drip: yes Sneezing: no Sore throat: no Swollen glands: no Sinus pressure: no Headache: yes Face pain: no Toothache: no Ear pain: yes bilateral Ear pressure: no bilateral Eyes red/itching:no Eye drainage/crusting: no  Vomiting: no Rash: no Fatigue: yes Sick contacts: no Strep contacts: no  Context: better Recurrent sinusitis:  no Relief with OTC cold/cough medications: yes  Treatments attempted: cold/sinus     Depression Screen done today and results listed below:     10/18/2023    8:09 AM 08/10/2023   10:40 AM 05/25/2023    1:33 PM 02/23/2023    8:36 AM 12/27/2022   11:18 AM  Depression screen PHQ 2/9  Decreased Interest 0 0 0 0 0  Down, Depressed, Hopeless 0 0 0 0 0  PHQ - 2 Score 0 0 0 0 0  Altered sleeping 0 0 0 0 0  Tired, decreased energy 0 0 0 0 1  Change in appetite 0 0 0 0 0  Feeling bad or failure about yourself  0 0 0 0 0  Trouble concentrating 0 0 0 0 0  Moving slowly or fidgety/restless 0 0 0 0 0  Suicidal thoughts 0 0 0 0 0  PHQ-9 Score 0  0  0  0  1   Difficult doing work/chores Not difficult at all    Somewhat difficult     Data saved with a previous flowsheet row definition    The patient does not have a history of falls. I did complete a risk assessment for falls. A plan of care for falls was documented.   Past Medical History:  Past Medical History:  Diagnosis Date   DVT of lower extremity, bilateral (HCC)    stopped Eliquis  in 2017   GERD (gastroesophageal reflux disease)    Hyperlipidemia     Surgical History:  Past Surgical History:  Procedure Laterality Date   ABDOMINAL AORTOGRAM W/LOWER EXTREMITY Right 11/18/2016   Procedure: ABDOMINAL AORTOGRAM W/LOWER EXTREMITY;  Surgeon: Jama Cordella MATSU, MD;  Location: [facility];  Service: Cardiovascular;  Laterality: Right;   APPENDECTOMY     LAPAROSCOPIC APPENDECTOMY N/A 10/14/2014   Procedure: APPENDECTOMY LAPAROSCOPIC;  Surgeon: Charlie FORBES Fell, MD;  Location: [facility];  Service: General;  Laterality: N/A;   TONSILLECTOMY     WISDOM TOOTH EXTRACTION      Medications:  Current Outpatient Medications on File Prior to Visit  Medication Sig   apixaban  (ELIQUIS ) 5 MG TABS tablet Take 1 tablet (5 mg total) by mouth 2 (two) times daily.   aspirin  EC 81 MG tablet Take 81 mg by mouth daily. Swallow whole.    atorvastatin  (LIPITOR) 40 MG tablet TAKE 1 TABLET BY MOUTH EVERY DAY AFTER 6PM   gabapentin  (NEURONTIN ) 100 MG capsule Take 1 capsule (100 mg total) by mouth at bedtime. (Patient taking differently: Take 100 mg by mouth as needed.)   loratadine  (CLARITIN ) 10 MG tablet Take 1 tablet (10 mg total) by mouth daily.   No current facility-administered medications on file prior to visit.    Allergies:  Allergies  Allergen Reactions   Bee Venom Hives   Morphine  Nausea Only   Vicodin [Hydrocodone -Acetaminophen ] Nausea And Vomiting    Headache   Hydrocodone -Acetaminophen  Nausea And Vomiting, Nausea Only and Other (See Comments)    Headache   Mushroom Extract Complex (Obsolete) Nausea And Vomiting   Olive Oil Nausea And Vomiting and Nausea Only    Social History:  Social History   Socioeconomic History   Marital status: Single    Spouse name: Not on file   Number of children: Not on file   Years of education: Not on file   Highest education level: Not on file  Occupational History   Not on file  Tobacco Use   Smoking status: Every Day    Current packs/day: 0.25    Average packs/day: 0.3 packs/day for 15.0 years (3.8 ttl pk-yrs)    Types: Cigarettes   Smokeless tobacco: Never  Vaping Use   Vaping status: Never Used  Substance and Sexual Activity   Alcohol use: Yes    Comment: very occasionally   Drug use: No   Sexual activity: Not Currently  Other Topics Concern   Not on file  Social History Narrative   Not on file   Social Drivers of Health   Financial Resource Strain: Not on file  Food Insecurity: No Food Insecurity (11/11/2022)   Hunger Vital Sign    Worried About Running Out of Food in the Last Year: Never true    Ran Out of Food in the Last Year: Never true  Transportation Needs: No Transportation Needs (11/11/2022)   PRAPARE - Administrator, Civil Service (Medical): No    Lack of Transportation (Non-Medical): No  Physical Activity: Not on file  Stress:  Not on file  Social Connections: Not on file  Intimate Partner Violence: Not At Risk (11/11/2022)   Humiliation, Afraid, Rape, and Kick questionnaire    Fear of Current or Ex-Partner: No    Emotionally Abused: No    Physically Abused: No    Sexually Abused: No   Social History   Tobacco Use  Smoking Status Every Day   Current packs/day: 0.25   Average packs/day: 0.3 packs/day for 15.0 years (3.8 ttl pk-yrs)   Types: Cigarettes  Smokeless Tobacco Never   Social  History   Substance and Sexual Activity  Alcohol Use Yes   Comment: very occasionally    Family History:  Family History  Problem Relation Age of Onset   Diabetes Mother    Rectal cancer Father    Scoliosis Brother    Uterine cancer Maternal Aunt    Diabetes Mellitus II Maternal Grandmother    Hypertension Maternal Grandmother    Heart disease Maternal Grandfather     Past medical history, surgical history, medications, allergies, family history and social history reviewed with patient today and changes made to appropriate areas of the chart.   Review of Systems  Constitutional:  Negative for malaise/fatigue.  HENT:  Positive for congestion and ear pain. Negative for sore throat.   Eyes:  Negative for blurred vision and double vision.  Respiratory:  Positive for cough, shortness of breath and wheezing.   Cardiovascular:  Negative for chest pain, palpitations and leg swelling.  Neurological:  Positive for headaches. Negative for dizziness.   All other ROS negative except what is listed above and in the HPI.      Objective:    BP 115/72 (BP Location: Left Arm, Patient Position: Sitting, Cuff Size: Large)   Pulse (!) 49   Temp 98.2 F (36.8 C) (Oral)   Ht 5' 9.02 (1.753 m)   Wt 291 lb (132 kg)   SpO2 95%   BMI 42.95 kg/m   Wt Readings from Last 3 Encounters:  02/19/24 291 lb (132 kg)  02/16/24 296 lb 12.8 oz (134.6 kg)  02/12/24 287 lb (130.2 kg)    Physical Exam Vitals and nursing note reviewed.   Constitutional:      General: He is not in acute distress.    Appearance: Normal appearance. He is not ill-appearing, toxic-appearing or diaphoretic.  HENT:     Head: Normocephalic.     Right Ear: Tympanic membrane and external ear normal.     Left Ear: Tympanic membrane and external ear normal.     Nose: Rhinorrhea present. No congestion.     Mouth/Throat:     Mouth: Mucous membranes are moist.     Pharynx: Posterior oropharyngeal erythema present. No oropharyngeal exudate.  Eyes:     General:        Right eye: No discharge.        Left eye: No discharge.     Extraocular Movements: Extraocular movements intact.     Conjunctiva/sclera: Conjunctivae normal.     Pupils: Pupils are equal, round, and reactive to light.  Cardiovascular:     Rate and Rhythm: Normal rate and regular rhythm.     Heart sounds: No murmur heard. Pulmonary:     Effort: Pulmonary effort is normal. No respiratory distress.     Breath sounds: Normal breath sounds. No wheezing, rhonchi or rales.  Abdominal:     General: Abdomen is flat. Bowel sounds are normal.  Musculoskeletal:     Cervical back: Normal range of motion and neck supple.  Skin:    General: Skin is warm and dry.     Capillary Refill: Capillary refill takes less than 2 seconds.  Neurological:     General: No focal deficit present.     Mental Status: He is alert and oriented to person, place, and time.  Psychiatric:        Mood and Affect: Mood normal.        Behavior: Behavior normal.        Thought Content: Thought content normal.  Judgment: Judgment normal.     Results for orders placed or performed in visit on 01/04/24  ECHOCARDIOGRAM COMPLETE   Collection Time: 02/15/24  9:34 AM  Result Value Ref Range   AR max vel 2.95 cm2   AV Peak grad 8.8 mmHg   Ao pk vel 1.48 m/s   S' Lateral 3.13 cm   Area-P 1/2 3.27 cm2   AV Area VTI 2.96 cm2   AV Mean grad 5.0 mmHg   AV Area mean vel 2.61 cm2   Est EF 55 - 60%        Assessment & Plan:   Problem List Items Addressed This Visit       Other   Morbid obesity (HCC)   Recommended eating smaller high protein, low fat meals more frequently and exercising 30 mins a day 5 times a week with a goal of 10-15lb weight loss in the next 3 months.      Current use of long term anticoagulation   Chronic.  Controlled.  Continue with current medication regimen of Eliquis .  Followed by Vascular Labs ordered today.  Return to clinic in 6 months for reevaluation.  Call sooner if concerns arise.        Relevant Orders   CBC with Differential/Platelet   Prediabetes   Chronic.  Controlled.  Continue with current medication regimen.  Labs ordered today.  Return to clinic in 6 months for reevaluation.  Call sooner if concerns arise.        Relevant Orders   Hemoglobin A1c   Other Visit Diagnoses       Annual physical exam    -  Primary   Health maintenance reviewed. Labs ordered.  Vaccines reviewed.   Relevant Orders   TSH   Lipid panel   CBC with Differential/Platelet   Comprehensive metabolic panel with GFR   Hemoglobin A1c     Acute cough       Will treat with amoxicillin .  Complete course of medication.  Follow up if not improved.        Discussed aspirin  prophylaxis for myocardial infarction prevention and decision was it was not indicated  LABORATORY TESTING:  Health maintenance labs ordered today as discussed above.    IMMUNIZATIONS:   - Tdap: Tetanus vaccination status reviewed: last tetanus booster within 10 years. - Influenza: Refused - Pneumovax: Refused - Prevnar: Refused - COVID: Refused - HPV: Not applicable - Shingrix vaccine: Not applicable  SCREENING: - Colonoscopy: Not applicable  Discussed with patient purpose of the colonoscopy is to detect colon cancer at curable precancerous or early stages   - AAA Screening: Not applicable  -Hearing Test: Not applicable  -Spirometry: Not applicable   PATIENT COUNSELING:     Sexuality: Discussed sexually transmitted diseases, partner selection, use of condoms, avoidance of unintended pregnancy  and contraceptive alternatives.   Advised to avoid cigarette smoking.  I discussed with the patient that most people either abstain from alcohol or drink within safe limits (<=14/week and <=4 drinks/occasion for males, <=7/weeks and <= 3 drinks/occasion for females) and that the risk for alcohol disorders and other health effects rises proportionally with the number of drinks per week and how often a drinker exceeds daily limits.  Discussed cessation/primary prevention of drug use and availability of treatment for abuse.   Diet: Encouraged to adjust caloric intake to maintain  or achieve ideal body weight, to reduce intake of dietary saturated fat and total fat, to limit sodium intake by avoiding high  sodium foods and not adding table salt, and to maintain adequate dietary potassium and calcium  preferably from fresh fruits, vegetables, and low-fat dairy products.    stressed the importance of regular exercise  Injury prevention: Discussed safety belts, safety helmets, smoke detector, smoking near bedding or upholstery.   Dental health: Discussed importance of regular tooth brushing, flossing, and dental visits.   Follow up plan: NEXT PREVENTATIVE PHYSICAL DUE IN 1 YEAR. Return in about 6 months (around 08/19/2024) for HTN, HLD, DM2 FU.

## 2024-02-19 NOTE — Assessment & Plan Note (Signed)
 Chronic.  Controlled.  Continue with current medication regimen.  Labs ordered today.  Return to clinic in 6 months for reevaluation.  Call sooner if concerns arise.  ? ?

## 2024-02-19 NOTE — Assessment & Plan Note (Signed)
 Chronic.  Controlled.  Continue with current medication regimen of Eliquis .  Followed by Vascular Labs ordered today.  Return to clinic in 6 months for reevaluation.  Call sooner if concerns arise.

## 2024-02-20 ENCOUNTER — Ambulatory Visit: Payer: Self-pay | Admitting: Nurse Practitioner

## 2024-02-20 LAB — COMPREHENSIVE METABOLIC PANEL WITH GFR
ALT: 34 IU/L (ref 0–44)
AST: 22 IU/L (ref 0–40)
Albumin: 4.3 g/dL (ref 4.1–5.1)
Alkaline Phosphatase: 64 IU/L (ref 47–123)
BUN/Creatinine Ratio: 8 — ABNORMAL LOW (ref 9–20)
BUN: 6 mg/dL (ref 6–20)
Bilirubin Total: 0.3 mg/dL (ref 0.0–1.2)
CO2: 21 mmol/L (ref 20–29)
Calcium: 9.2 mg/dL (ref 8.7–10.2)
Chloride: 100 mmol/L (ref 96–106)
Creatinine, Ser: 0.71 mg/dL — ABNORMAL LOW (ref 0.76–1.27)
Globulin, Total: 2.8 g/dL (ref 1.5–4.5)
Glucose: 66 mg/dL — ABNORMAL LOW (ref 70–99)
Potassium: 4.6 mmol/L (ref 3.5–5.2)
Sodium: 137 mmol/L (ref 134–144)
Total Protein: 7.1 g/dL (ref 6.0–8.5)
eGFR: 120 mL/min/1.73 (ref >59)

## 2024-02-20 LAB — CBC WITH DIFFERENTIAL/PLATELET
Basophils Absolute: 0 x10E3/uL (ref 0.0–0.2)
Basos: 1 %
EOS (ABSOLUTE): 0.2 x10E3/uL (ref 0.0–0.4)
Eos: 3 %
Hematocrit: 48.9 % (ref 37.5–51.0)
Hemoglobin: 16.2 g/dL (ref 13.0–17.7)
Immature Grans (Abs): 0 x10E3/uL (ref 0.0–0.1)
Immature Granulocytes: 0 %
Lymphocytes Absolute: 1.9 x10E3/uL (ref 0.7–3.1)
Lymphs: 29 %
MCH: 29.8 pg (ref 26.6–33.0)
MCHC: 33.1 g/dL (ref 31.5–35.7)
MCV: 90 fL (ref 79–97)
Monocytes Absolute: 0.6 x10E3/uL (ref 0.1–0.9)
Monocytes: 9 %
Neutrophils Absolute: 3.8 x10E3/uL (ref 1.4–7.0)
Neutrophils: 57 %
Platelets: 241 x10E3/uL (ref 150–450)
RBC: 5.43 x10E6/uL (ref 4.14–5.80)
RDW: 12.7 % (ref 11.6–15.4)
WBC: 6.4 x10E3/uL (ref 3.4–10.8)

## 2024-02-20 LAB — HEMOGLOBIN A1C
Est. average glucose Bld gHb Est-mCnc: 117 mg/dL
Hgb A1c MFr Bld: 5.7 % — ABNORMAL HIGH (ref 4.8–5.6)

## 2024-02-20 LAB — LIPID PANEL
Chol/HDL Ratio: 4.9 ratio (ref 0.0–5.0)
Cholesterol, Total: 162 mg/dL (ref 100–199)
HDL: 33 mg/dL — ABNORMAL LOW (ref >39)
LDL Chol Calc (NIH): 102 mg/dL — ABNORMAL HIGH (ref 0–99)
Triglycerides: 151 mg/dL — ABNORMAL HIGH (ref 0–149)
VLDL Cholesterol Cal: 27 mg/dL (ref 5–40)

## 2024-02-20 LAB — TSH: TSH: 2.75 u[IU]/mL (ref 0.450–4.500)

## 2024-03-06 ENCOUNTER — Ambulatory Visit
Admission: RE | Admit: 2024-03-06 | Discharge: 2024-03-06 | Disposition: A | Discharge status: Home / Self Care | Source: Ambulatory Visit | Attending: Cardiology | Admitting: Cardiology

## 2024-03-06 ENCOUNTER — Other Ambulatory Visit: Payer: Self-pay | Admitting: Cardiology

## 2024-03-06 DIAGNOSIS — I514 Myocarditis, unspecified: Secondary | ICD-10-CM | POA: Diagnosis present

## 2024-03-06 MED ORDER — GADOBUTROL 1 MMOL/ML IV SOLN
16.0000 mL | Freq: Once | INTRAVENOUS | Status: AC | PRN
  Administered 2024-03-06: 16 mL via INTRAVENOUS

## 2024-03-08 ENCOUNTER — Ambulatory Visit: Payer: Self-pay | Admitting: Cardiology

## 2024-03-13 ENCOUNTER — Ambulatory Visit

## 2024-04-03 NOTE — Progress Notes (Unsigned)
 " Cardiology Office Note   Date:  04/04/2024  ID:  EWARD RUTIGLIANO, DOB [birth year], MRN [id number] PCP: Melvin Pao, NP  [facility] Providers Cardiologist:  None Cardiology APP:  Gerard Frederick, NP     History of Present Illness Thomas Hopkins is a 39 y.o. male with a past medical history of hyperlipidemia, peripheral arterial disease, history of recurrent bilateral DVT, lymphedema, GERD, who presents today for follow-up.   Patient previously been followed by vascular regarding left leg pain and swelling.  He has been treated for lymphedema where he has been wearing graduated compression stockings and noted little improvement.  He continues to have pain and swelling left lower extremity venous duplex was ordered to rule out DVT.  He was then evaluated in the [facility] emergency department on 11/07/2023 with complaint of chest pain. Reportedly been dealing with it for about a month and intermittent pressure in the center of his chest. 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 He denies significant chest pain or shortness of breath on evaluation in the emergency department stated had been dealing with it and it was worsening. He also had a headache over the last month. Reported dry cough over the past week but denied any fevers. He has chronic swelling in his legs that is unchanged. History of DVT to the left leg currently taking Eliquis  but admits to missing some doses. Blood pressure was 129/76, pulse 54, respirations of 18, temperature 98.6. EKG showed no evidence of arrhythmia or ischemia troponin was within normal limits. CT of the chest to rule out PE was ordered also CT of the head with increasing frequency and severity and headaches. CT of the head was negative for acute process. CT of the chest also unremarkable with no evidence of PE. 2 sets of troponin within normal limits  and the patient had no active chest pain at that time. He was considered stable for discharge and a referral to cardiology was made.   He was last seen in clinic 11/24/2023 with continued complaints of chest discomfort that he states has been intermittent in the center of his chest that is worse with exertion.  He was scheduled for Lexiscan  Myoview  for his ongoing chest discomfort and an echocardiogram to rule out functional and structural abnormalities.   He was last seen in clinic 02/23/2024 stating he been sick for approximately 2 weeks.  He recently followed up with vascular for concerns of discolored sensation to his left lower extremity was advised was normal progression due to his chronic DVT.  He was scheduled for cardiac MRI to evaluate scar.  He was also started on loratadine  10 mg daily.   He returns to clinic today he returns to clinic today with complaint of continuing occasional chest discomfort and shortness of breath that comes and goes with or without activity.  He also states that he has been unable to sleep and has daytime sleepiness and wakes up several times throughout the night.  He states that he has occasionally had PND but not as often as previous.  He denies any orthopnea.  He recently underwent an MRI that revealed no scar and no myocarditis.  States that he has been compliant with his current medication regimen.  States that he has not missed any of his apixaban .  Denies any bleeding with no blood noted in his urine or stool.  Denies any hospitalizations or  visits to the emergency department.  ROS: 10 point review of system has been reviewed and considered negative the exception was been listed in the HPI  Studies Reviewed EKG Interpretation Date/Time:  Thursday April 04 2024 07:57:39 EST Ventricular Rate:  52 PR Interval:  168 QRS Duration:  80 QT Interval:  440 QTC Calculation: 409 R Axis:   2  Text Interpretation: Sinus bradycardia Minimal voltage criteria for LVH,  may be normal variant ( R in aVL ) When compared with ECG of 24-Nov-2023 13:59, No significant change was found Confirmed by Gerard Frederick ([id number]) on 04/04/2024 7:59:31 AM    Cardiac MRI 03/06/2024 IMPRESSION: 1.  Normal LV size and systolic function.  LVEF 63%.   2.  No LGE or scar.   3.  No evidence for inflammatory or infiltrative disease.   4.  Normal RV systolic function.   5.  No significant valvular abnormalities.   6.  No evidence for myocarditis.  2d echo 02/15/2024 1. Left ventricular ejection fraction, by estimation, is 55 to 60%. The  left ventricle has normal function. Left ventricular endocardial border  not optimally defined to evaluate regional wall motion. There is mild left  ventricular hypertrophy. Left  ventricular diastolic parameters were normal. The average left ventricular  global longitudinal strain is -19.1 %. The global longitudinal strain is  normal.   2. Right ventricular systolic function is normal. The right ventricular  size is normal. Tricuspid regurgitation signal is inadequate for assessing  PA pressure.   3. The mitral valve is normal in structure. No evidence of mitral valve  regurgitation. No evidence of mitral stenosis.   4. The aortic valve is tricuspid. Aortic valve regurgitation is not  visualized. No aortic stenosis is present.   5. The inferior vena cava is normal in size with greater than 50%  respiratory variability, suggesting right atrial pressure of 3 mmHg.    Lexiscan  MPI 12/26/2023   Abnormal, probably low risk pharmacologic myocardial perfusion stress test.   There is a small in size, moderate in severity, fixed apical inferior and apical lateral defect most consistent with scar.   Left ventricular systolic function is normal (LVEF 55-65%).   No significant coronary artery calcification is identified.   Sensitivity and specificity of the study are limited due to body habitus and diaphragmatic attenuation.  Risk  Assessment/Calculations       Height:  5' 9 (1.753 m)     Weight: 294 lb 8 oz (133.6 kg)  BMI: Body mass index is 43.49 kg/m.    STOP-BANG RISK ASSESSMENT       04/04/2024    9:47 AM  STOP-BANG  Do you snore loudly? No  Do you often feel tired, fatigued, or sleepy during the daytime? Yes  Has anyone observed you stop breathing during sleep? No  Do you have (or are you being treated for) high blood pressure? No  Recent BMI (Calculated) 43.47  Is BMI greater than 35 kg/m2? 1=Yes  Age older than 39 years old? 0=No  Has large neck size > 40 cm (15.7 in, large male shirt size, large male collar size > 16) Yes  Gender - Male 1=Yes  STOP-Bang Total Score 4      If STOP-BANG Score >=3 OR two clinical symptoms - patient qualifies for WatchPAT (CPT 95800)      Sleep study ordered due to two (2) of the following clinical symptoms/diagnoses:  Excessive daytime sleepiness G47.10  Gastroesophageal reflux K21.9  Nocturia R35.1  Morning Headaches G44.221  Difficulty concentrating R41.840  Memory problems or poor judgment G31.84  Personality changes or irritability R45.4  Loud snoring R06.83  Depression F32.9  Unrefreshed by sleep G47.8  Impotence N52.9  History of high blood pressure R03.0  Insomnia G47.00  Sleep Disordered Breathing or Sleep Apnea ICD G47.33         STOP-Bang Score:  4      Physical Exam VS:  BP 100/60 (BP Location: Left Arm, Patient Position: Sitting, Cuff Size: Large)   Pulse (!) 52   Ht 5' 9 (1.753 m)   Wt 294 lb 8 oz (133.6 kg)   SpO2 96%   BMI 43.49 kg/m        Wt Readings from Last 3 Encounters:  04/04/24 294 lb 8 oz (133.6 kg)  02/19/24 291 lb (132 kg)  02/16/24 296 lb 12.8 oz (134.6 kg)    GEN: Well nourished, well developed in no acute distress NECK: No JVD; No carotid bruits CARDIAC: RRR, no murmurs, rubs, gallops RESPIRATORY:  Clear to auscultation without rales, wheezing or rhonchi  ABDOMEN: Soft, non-tender, non-distended EXTREMITIES:  Trace pretibial edema slightly greater on the left than the right; No deformity, cyst noted to the left wrist that is painful with mild palpation  ASSESSMENT AND PLAN Atypical/typical chest pain with recent Lexiscan  Myoview  considered abnormal as he had a small in size moderate in severity fixed apical defect and apical lateral defects most consistent with scar.  Pictures were limited due to body habitus and diaphragmatic attenuation.  He was scheduled for cardiac MRI which revealed an LVEF of 63%, no LGE or scar, no evidence of inflammatory or infiltrative disease, normal RV systolic function, no significant valvular abnormalities, and no evidence of myocarditis.  EKG today revealed sinus bradycardia with a rate of 52 with LVH with no acute ischemic changes.  Chronic shortness of breath with recent echocardiogram revealed an LVEF 55 to 60% with mild LVH, left ventricular diastolic parameters are normal, right ventricular function and size were normal there were no valvular abnormalities that were noted.  Mixed hyperlipidemia with an LDL of 102 which is slightly improving.  He has continued on atorvastatin  40 mg daily with ongoing management per his PCP.  Peripheral edema with chronic lymphedema with recent ultrasound showing chronic DVT.  Has been continued on apixaban  5 mg twice daily with improved adherence.  Ongoing management per [facility].  Sinus bradycardia with a EKG today revealing sinus bradycardia with a rate of 52 with minimal voltage for LVH.  Further chart review reveals heart rates in the 70s and 80s at other visits.  He has been placed on a ZIO XT monitor to rule out any arrhythmia or pauses.  Sleep disordered breathing with a STOP-BANG score of 4.  He has been referred to pulmonary for evaluation of sleep apnea       Dispo: Patient to return to clinic to see MD/APP in 3 months or sooner if needed for further evaluation  Signed, Toshio Slusher, NP   "

## 2024-04-04 ENCOUNTER — Ambulatory Visit

## 2024-04-04 ENCOUNTER — Encounter: Payer: Self-pay | Admitting: Cardiology

## 2024-04-04 ENCOUNTER — Ambulatory Visit: Attending: Cardiology | Admitting: Cardiology

## 2024-04-04 VITALS — BP 100/60 | HR 52 | Ht 69.0 in | Wt 294.5 lb

## 2024-04-04 DIAGNOSIS — E782 Mixed hyperlipidemia: Secondary | ICD-10-CM | POA: Insufficient documentation

## 2024-04-04 DIAGNOSIS — G4730 Sleep apnea, unspecified: Secondary | ICD-10-CM | POA: Insufficient documentation

## 2024-04-04 DIAGNOSIS — I890 Lymphedema, not elsewhere classified: Secondary | ICD-10-CM | POA: Insufficient documentation

## 2024-04-04 DIAGNOSIS — R079 Chest pain, unspecified: Secondary | ICD-10-CM | POA: Diagnosis not present

## 2024-04-04 DIAGNOSIS — I82503 Chronic embolism and thrombosis of unspecified deep veins of lower extremity, bilateral: Secondary | ICD-10-CM | POA: Insufficient documentation

## 2024-04-04 DIAGNOSIS — R600 Localized edema: Secondary | ICD-10-CM | POA: Insufficient documentation

## 2024-04-04 DIAGNOSIS — R001 Bradycardia, unspecified: Secondary | ICD-10-CM

## 2024-04-04 DIAGNOSIS — R0602 Shortness of breath: Secondary | ICD-10-CM | POA: Diagnosis not present

## 2024-04-04 NOTE — Patient Instructions (Signed)
 Medication Instructions:  Your physician recommends that you continue on your current medications as directed. Please refer to the Current Medication list given to you today.   *If you need a refill on your cardiac medications before your next appointment, please call your pharmacy*  Lab Work: No labs ordered today  If you have labs (blood work) drawn today and your tests are completely normal, you will receive your results only by: MyChart Message (if you have MyChart) OR A paper copy in the mail If you have any lab test that is abnormal or we need to change your treatment, we will call you to review the results.  Testing/Procedures: Your physician has recommended that you wear a Zio monitor.   This monitor is a medical device that records the hearts electrical activity. Doctors most often use these monitors to diagnose arrhythmias. Arrhythmias are problems with the speed or rhythm of the heartbeat. The monitor is a small device applied to your chest. You can wear one while you do your normal daily activities. While wearing this monitor if you have any symptoms to push the button and record what you felt. Once you have worn this monitor for the period of time provider prescribed (Usually 14 days), you will return the monitor device in the postage paid box. Once it is returned they will download the data collected and provide us  with a report which the provider will then review and we will call you with those results. Important tips:  Avoid showering during the first 24 hours of wearing the monitor. Avoid excessive sweating to help maximize wear time. Do not submerge the device, no hot tubs, and no swimming pools. Keep any lotions or oils away from the patch. After 24 hours you may shower with the patch on. Take brief showers with your back facing the shower head.  Do not remove patch once it has been placed because that will interrupt data and decrease adhesive wear time. Push the button when  you have any symptoms and write down what you were feeling. Once you have completed wearing your monitor, remove and place into box which has postage paid and place in your outgoing mailbox.  If for some reason you have misplaced your box then call our office and we can provide another box and/or mail it off for you.   Follow-Up: At [facility], you and your health needs are our priority.  As part of our continuing mission to provide you with exceptional heart care, our providers are all part of one team.  This team includes your primary Cardiologist (physician) and Advanced Practice Providers or APPs (Physician Assistants and Nurse Practitioners) who all work together to provide you with the care you need, when you need it.  Your next appointment:   3 month(s)  Provider:   Tylene Lunch, NP    We recommend signing up for the patient portal called MyChart.  Sign up information is provided on this After Visit Summary.  MyChart is used to connect with patients for Virtual Visits (Telemedicine).  Patients are able to view lab/test results, encounter notes, upcoming appointments, etc.  Non-urgent messages can be sent to your provider as well.   To learn more about what you can do with MyChart, go to [email or web address].

## 2024-05-02 ENCOUNTER — Ambulatory Visit: Admitting: Sleep Medicine

## 2024-05-02 ENCOUNTER — Ambulatory Visit: Admitting: Pulmonary Disease

## 2024-07-11 ENCOUNTER — Ambulatory Visit: Admitting: Cardiology

## 2024-08-16 ENCOUNTER — Ambulatory Visit: Admitting: Nurse Practitioner

## 2025-02-13 ENCOUNTER — Ambulatory Visit (INDEPENDENT_AMBULATORY_CARE_PROVIDER_SITE_OTHER): Admitting: Vascular Surgery

## 2025-02-13 ENCOUNTER — Encounter (INDEPENDENT_AMBULATORY_CARE_PROVIDER_SITE_OTHER)
